# Patient Record
Sex: Female | Born: 1980 | Race: Black or African American | Hispanic: No | Marital: Single | Smoking: Never smoker
Health system: Southern US, Community
[De-identification: ages and names within clinical notes are randomized; demographics above are authoritative.]

## PROBLEM LIST (undated history)

## (undated) DIAGNOSIS — I2699 Other pulmonary embolism without acute cor pulmonale: Secondary | ICD-10-CM

## (undated) DIAGNOSIS — Z9289 Personal history of other medical treatment: Secondary | ICD-10-CM

## (undated) DIAGNOSIS — I82409 Acute embolism and thrombosis of unspecified deep veins of unspecified lower extremity: Secondary | ICD-10-CM

## (undated) DIAGNOSIS — D219 Benign neoplasm of connective and other soft tissue, unspecified: Secondary | ICD-10-CM

## (undated) DIAGNOSIS — D509 Iron deficiency anemia, unspecified: Secondary | ICD-10-CM

## (undated) HISTORY — PX: TUBAL LIGATION: SHX77

## (undated) HISTORY — PX: DILATION AND CURETTAGE OF UTERUS: SHX78

## (undated) HISTORY — DX: Other pulmonary embolism without acute cor pulmonale: I26.99

## (undated) HISTORY — PX: ABDOMINAL HYSTERECTOMY: SHX81

---

## 2018-04-25 ENCOUNTER — Other Ambulatory Visit: Payer: Self-pay

## 2018-04-25 ENCOUNTER — Emergency Department (HOSPITAL_COMMUNITY): Payer: Self-pay

## 2018-04-25 ENCOUNTER — Encounter (HOSPITAL_COMMUNITY): Payer: Self-pay

## 2018-04-25 ENCOUNTER — Emergency Department (HOSPITAL_COMMUNITY)
Admission: EM | Admit: 2018-04-25 | Discharge: 2018-04-25 | Disposition: A | Discharge status: Home / Self Care | Payer: Self-pay | Attending: Emergency Medicine | Admitting: Emergency Medicine

## 2018-04-25 DIAGNOSIS — Z5321 Procedure and treatment not carried out due to patient leaving prior to being seen by health care provider: Secondary | ICD-10-CM | POA: Insufficient documentation

## 2018-04-25 DIAGNOSIS — R079 Chest pain, unspecified: Secondary | ICD-10-CM | POA: Insufficient documentation

## 2018-04-25 LAB — BASIC METABOLIC PANEL
Anion gap: 16 — ABNORMAL HIGH (ref 5–15)
BUN: <5 mg/dL — ABNORMAL LOW (ref 6–20)
CO2: 25 mmol/L (ref 22–32)
Calcium: 9.3 mg/dL (ref 8.9–10.3)
Chloride: 98 mmol/L (ref 98–111)
Creatinine, Ser: 0.89 mg/dL (ref 0.44–1.00)
GFR calc Af Amer: >60 mL/min (ref >60)
GFR calc non Af Amer: >60 mL/min (ref >60)
GLUCOSE: 96 mg/dL (ref 70–99)
Potassium: 3 mmol/L — ABNORMAL LOW (ref 3.5–5.1)
Sodium: 139 mmol/L (ref 135–145)

## 2018-04-25 LAB — CBC
HCT: 33.9 % — ABNORMAL LOW (ref 36.0–46.0)
Hemoglobin: 10.4 g/dL — ABNORMAL LOW (ref 12.0–15.0)
MCH: 27.4 pg (ref 26.0–34.0)
MCHC: 30.7 g/dL (ref 30.0–36.0)
MCV: 89.2 fL (ref 80.0–100.0)
Platelets: 387 10*3/uL (ref 150–400)
RBC: 3.8 MIL/uL — ABNORMAL LOW (ref 3.87–5.11)
RDW: 17.8 % — ABNORMAL HIGH (ref 11.5–15.5)
WBC: 5.4 10*3/uL (ref 4.0–10.5)
nRBC: 0 % (ref 0.0–0.2)

## 2018-04-25 LAB — I-STAT TROPONIN, ED: Troponin i, poc: 0 ng/mL (ref 0.00–0.08)

## 2018-04-25 LAB — I-STAT BETA HCG BLOOD, ED (MC, WL, AP ONLY): I-stat hCG, quantitative: <5 m[IU]/mL (ref <5)

## 2018-04-25 MED ORDER — SODIUM CHLORIDE 0.9% FLUSH: 3.0000 mL | Freq: Once | INTRAVENOUS | Status: DC

## 2018-04-25 NOTE — ED Notes (Signed)
Called pt x4 for vitals, no response. 

## 2018-04-25 NOTE — ED Notes (Signed)
Pt called x2 for vitals, no response.

## 2018-04-25 NOTE — ED Triage Notes (Signed)
Pt here for central non radiating chest pain for the last 6 months.  No new symptoms today.  A&Ox4.

## 2018-05-08 ENCOUNTER — Encounter (HOSPITAL_COMMUNITY): Payer: Self-pay | Admitting: Emergency Medicine

## 2018-05-08 ENCOUNTER — Inpatient Hospital Stay (HOSPITAL_COMMUNITY): Payer: Medicaid Other

## 2018-05-08 ENCOUNTER — Inpatient Hospital Stay (HOSPITAL_COMMUNITY)
Admission: EM | Admit: 2018-05-08 | Discharge: 2018-05-18 | DRG: 982 | Disposition: A | Discharge status: Home / Self Care | Payer: Medicaid Other | Attending: Internal Medicine | Admitting: Internal Medicine

## 2018-05-08 ENCOUNTER — Other Ambulatory Visit: Payer: Self-pay

## 2018-05-08 ENCOUNTER — Emergency Department (HOSPITAL_COMMUNITY): Payer: Medicaid Other

## 2018-05-08 DIAGNOSIS — R5383 Other fatigue: Secondary | ICD-10-CM | POA: Diagnosis present

## 2018-05-08 DIAGNOSIS — L680 Hirsutism: Secondary | ICD-10-CM | POA: Diagnosis present

## 2018-05-08 DIAGNOSIS — I745 Embolism and thrombosis of iliac artery: Secondary | ICD-10-CM | POA: Diagnosis present

## 2018-05-08 DIAGNOSIS — R000 Tachycardia, unspecified: Secondary | ICD-10-CM | POA: Diagnosis present

## 2018-05-08 DIAGNOSIS — D62 Acute posthemorrhagic anemia: Secondary | ICD-10-CM | POA: Diagnosis present

## 2018-05-08 DIAGNOSIS — Z8249 Family history of ischemic heart disease and other diseases of the circulatory system: Secondary | ICD-10-CM

## 2018-05-08 DIAGNOSIS — E876 Hypokalemia: Secondary | ICD-10-CM | POA: Diagnosis present

## 2018-05-08 DIAGNOSIS — I82412 Acute embolism and thrombosis of left femoral vein: Secondary | ICD-10-CM | POA: Diagnosis not present

## 2018-05-08 DIAGNOSIS — I82409 Acute embolism and thrombosis of unspecified deep veins of unspecified lower extremity: Secondary | ICD-10-CM

## 2018-05-08 DIAGNOSIS — N939 Abnormal uterine and vaginal bleeding, unspecified: Secondary | ICD-10-CM | POA: Diagnosis not present

## 2018-05-08 DIAGNOSIS — I071 Rheumatic tricuspid insufficiency: Secondary | ICD-10-CM | POA: Diagnosis present

## 2018-05-08 DIAGNOSIS — D219 Benign neoplasm of connective and other soft tissue, unspecified: Secondary | ICD-10-CM | POA: Diagnosis not present

## 2018-05-08 DIAGNOSIS — I2609 Other pulmonary embolism with acute cor pulmonale: Principal | ICD-10-CM | POA: Diagnosis present

## 2018-05-08 DIAGNOSIS — I2699 Other pulmonary embolism without acute cor pulmonale: Secondary | ICD-10-CM | POA: Diagnosis not present

## 2018-05-08 DIAGNOSIS — M7989 Other specified soft tissue disorders: Secondary | ICD-10-CM | POA: Diagnosis not present

## 2018-05-08 DIAGNOSIS — R0602 Shortness of breath: Secondary | ICD-10-CM | POA: Diagnosis not present

## 2018-05-08 DIAGNOSIS — Z9851 Tubal ligation status: Secondary | ICD-10-CM

## 2018-05-08 DIAGNOSIS — D259 Leiomyoma of uterus, unspecified: Secondary | ICD-10-CM | POA: Diagnosis present

## 2018-05-08 DIAGNOSIS — R079 Chest pain, unspecified: Secondary | ICD-10-CM

## 2018-05-08 DIAGNOSIS — I871 Compression of vein: Secondary | ICD-10-CM | POA: Diagnosis not present

## 2018-05-08 DIAGNOSIS — I824Y2 Acute embolism and thrombosis of unspecified deep veins of left proximal lower extremity: Secondary | ICD-10-CM | POA: Diagnosis not present

## 2018-05-08 DIAGNOSIS — E873 Alkalosis: Secondary | ICD-10-CM | POA: Diagnosis present

## 2018-05-08 DIAGNOSIS — I82422 Acute embolism and thrombosis of left iliac vein: Secondary | ICD-10-CM | POA: Diagnosis present

## 2018-05-08 DIAGNOSIS — R110 Nausea: Secondary | ICD-10-CM | POA: Diagnosis present

## 2018-05-08 DIAGNOSIS — R600 Localized edema: Secondary | ICD-10-CM | POA: Diagnosis present

## 2018-05-08 DIAGNOSIS — E875 Hyperkalemia: Secondary | ICD-10-CM | POA: Diagnosis present

## 2018-05-08 DIAGNOSIS — N920 Excessive and frequent menstruation with regular cycle: Secondary | ICD-10-CM | POA: Diagnosis present

## 2018-05-08 DIAGNOSIS — R1030 Lower abdominal pain, unspecified: Secondary | ICD-10-CM | POA: Diagnosis not present

## 2018-05-08 DIAGNOSIS — R9431 Abnormal electrocardiogram [ECG] [EKG]: Secondary | ICD-10-CM

## 2018-05-08 DIAGNOSIS — R071 Chest pain on breathing: Secondary | ICD-10-CM | POA: Diagnosis present

## 2018-05-08 HISTORY — DX: Benign neoplasm of connective and other soft tissue, unspecified: D21.9

## 2018-05-08 LAB — CBC
HCT: 33.9 % — ABNORMAL LOW (ref 36.0–46.0)
Hemoglobin: 10.8 g/dL — ABNORMAL LOW (ref 12.0–15.0)
MCH: 27.8 pg (ref 26.0–34.0)
MCHC: 31.9 g/dL (ref 30.0–36.0)
MCV: 87.1 fL (ref 80.0–100.0)
NRBC: 0 % (ref 0.0–0.2)
Platelets: 277 10*3/uL (ref 150–400)
RBC: 3.89 MIL/uL (ref 3.87–5.11)
RDW: 17.9 % — ABNORMAL HIGH (ref 11.5–15.5)
WBC: 4.5 10*3/uL (ref 4.0–10.5)

## 2018-05-08 LAB — POCT I-STAT 7, (LYTES, BLD GAS, ICA,H+H)
Acid-base deficit: 1 mmol/L (ref 0.0–2.0)
Bicarbonate: 22.1 mmol/L (ref 20.0–28.0)
Calcium, Ion: 1.06 mmol/L — ABNORMAL LOW (ref 1.15–1.40)
HCT: 28 % — ABNORMAL LOW (ref 36.0–46.0)
Hemoglobin: 9.5 g/dL — ABNORMAL LOW (ref 12.0–15.0)
O2 Saturation: 95 %
Potassium: 3.3 mmol/L — ABNORMAL LOW (ref 3.5–5.1)
Sodium: 138 mmol/L (ref 135–145)
TCO2: 23 mmol/L (ref 22–32)
pCO2 arterial: 29.4 mmHg — ABNORMAL LOW (ref 32.0–48.0)
pH, Arterial: 7.482 — ABNORMAL HIGH (ref 7.350–7.450)
pO2, Arterial: 68 mmHg — ABNORMAL LOW (ref 83.0–108.0)

## 2018-05-08 LAB — I-STAT BETA HCG BLOOD, ED (MC, WL, AP ONLY): I-stat hCG, quantitative: <5 m[IU]/mL (ref <5)

## 2018-05-08 LAB — HIV ANTIBODY (ROUTINE TESTING W REFLEX): HIV SCREEN 4TH GENERATION: NONREACTIVE

## 2018-05-08 LAB — BASIC METABOLIC PANEL
Anion gap: 17 — ABNORMAL HIGH (ref 5–15)
BUN: <5 mg/dL — ABNORMAL LOW (ref 6–20)
CO2: 25 mmol/L (ref 22–32)
Calcium: 9.1 mg/dL (ref 8.9–10.3)
Chloride: 95 mmol/L — ABNORMAL LOW (ref 98–111)
Creatinine, Ser: 0.77 mg/dL (ref 0.44–1.00)
GFR calc Af Amer: >60 mL/min (ref >60)
GFR calc non Af Amer: >60 mL/min (ref >60)
Glucose, Bld: 112 mg/dL — ABNORMAL HIGH (ref 70–99)
POTASSIUM: 3.1 mmol/L — AB (ref 3.5–5.1)
Sodium: 137 mmol/L (ref 135–145)

## 2018-05-08 LAB — D-DIMER, QUANTITATIVE: D-Dimer, Quant: 2.3 ug/mL-FEU — ABNORMAL HIGH (ref 0.00–0.50)

## 2018-05-08 LAB — I-STAT TROPONIN, ED: Troponin i, poc: 0.02 ng/mL (ref 0.00–0.08)

## 2018-05-08 LAB — ECHOCARDIOGRAM COMPLETE
Height: 63 in
Weight: 2348.8 oz

## 2018-05-08 LAB — HEPARIN LEVEL (UNFRACTIONATED)
Heparin Unfractionated: 0.83 IU/mL — ABNORMAL HIGH (ref 0.30–0.70)
Heparin Unfractionated: 0.68 IU/mL (ref 0.30–0.70)

## 2018-05-08 LAB — LACTIC ACID, PLASMA
Lactic Acid, Venous: 2.3 mmol/L (ref 0.5–1.9)
Lactic Acid, Venous: 2.5 mmol/L (ref 0.5–1.9)

## 2018-05-08 LAB — MAGNESIUM: Magnesium: 1.7 mg/dL (ref 1.7–2.4)

## 2018-05-08 MED ORDER — ONDANSETRON HCL 4 MG PO TABS
4.0000 mg | ORAL_TABLET | Freq: Four times a day (QID) | ORAL | Status: DC | PRN
  Administered 2018-05-12 – 2018-05-16 (×2): 4 mg via ORAL
  Filled 2018-05-08 (×2): qty 1

## 2018-05-08 MED ORDER — POTASSIUM CHLORIDE 10 MEQ/100ML IV SOLN
10.0000 meq | INTRAVENOUS | Status: AC
  Administered 2018-05-08 (×2): 10 meq via INTRAVENOUS
  Filled 2018-05-08 (×2): qty 100

## 2018-05-08 MED ORDER — POTASSIUM CHLORIDE CRYS ER 20 MEQ PO TBCR
40.0000 meq | EXTENDED_RELEASE_TABLET | Freq: Once | ORAL | Status: AC
  Administered 2018-05-08: 40 meq via ORAL
  Filled 2018-05-08: qty 2

## 2018-05-08 MED ORDER — HEPARIN SODIUM (PORCINE) 5000 UNIT/ML IJ SOLN: 60.0000 [IU]/kg | Freq: Once | INTRAMUSCULAR | Status: DC

## 2018-05-08 MED ORDER — HYDRALAZINE HCL 20 MG/ML IJ SOLN
10.0000 mg | Freq: Four times a day (QID) | INTRAMUSCULAR | Status: DC | PRN
  Administered 2018-05-08: 10 mg via INTRAVENOUS
  Filled 2018-05-08: qty 1

## 2018-05-08 MED ORDER — ACETAMINOPHEN 650 MG RE SUPP: 650.0000 mg | Freq: Four times a day (QID) | RECTAL | Status: DC | PRN

## 2018-05-08 MED ORDER — HYDROCODONE-ACETAMINOPHEN 5-325 MG PO TABS
1.0000 | ORAL_TABLET | Freq: Four times a day (QID) | ORAL | Status: DC | PRN
  Administered 2018-05-08: 2 via ORAL
  Filled 2018-05-08: qty 2

## 2018-05-08 MED ORDER — MAGNESIUM SULFATE IN D5W 1-5 GM/100ML-% IV SOLN
1.0000 g | Freq: Once | INTRAVENOUS | Status: AC
  Administered 2018-05-08: 1 g via INTRAVENOUS
  Filled 2018-05-08: qty 100

## 2018-05-08 MED ORDER — ONDANSETRON HCL 4 MG/2ML IJ SOLN
4.0000 mg | Freq: Four times a day (QID) | INTRAMUSCULAR | Status: DC | PRN
  Administered 2018-05-08 – 2018-05-17 (×9): 4 mg via INTRAVENOUS
  Filled 2018-05-08 (×10): qty 2

## 2018-05-08 MED ORDER — IOPAMIDOL (ISOVUE-370) INJECTION 76%
75.0000 mL | Freq: Once | INTRAVENOUS | Status: AC | PRN
  Administered 2018-05-08: 75 mL via INTRAVENOUS

## 2018-05-08 MED ORDER — ENSURE ENLIVE PO LIQD: 237.0000 mL | Freq: Two times a day (BID) | ORAL | Status: DC

## 2018-05-08 MED ORDER — ACETAMINOPHEN 325 MG PO TABS
650.0000 mg | ORAL_TABLET | Freq: Four times a day (QID) | ORAL | Status: DC | PRN
  Administered 2018-05-08: 650 mg via ORAL
  Filled 2018-05-08: qty 2

## 2018-05-08 MED ORDER — HEPARIN (PORCINE) 25000 UT/250ML-% IV SOLN
1200.0000 [IU]/h | INTRAVENOUS | Status: DC
  Administered 2018-05-08: 1000 [IU]/h via INTRAVENOUS
  Administered 2018-05-08: 1100 [IU]/h via INTRAVENOUS
  Administered 2018-05-09 – 2018-05-10 (×3): 950 [IU]/h via INTRAVENOUS
  Administered 2018-05-12: 1050 [IU]/h via INTRAVENOUS
  Administered 2018-05-13 – 2018-05-14 (×3): 1200 [IU]/h via INTRAVENOUS
  Filled 2018-05-08 (×8): qty 250

## 2018-05-08 MED ORDER — HEPARIN BOLUS VIA INFUSION
5000.0000 [IU] | Freq: Once | INTRAVENOUS | Status: AC
  Administered 2018-05-08: 5000 [IU] via INTRAVENOUS
  Filled 2018-05-08: qty 5000

## 2018-05-08 MED ORDER — METOPROLOL TARTRATE 5 MG/5ML IV SOLN
2.5000 mg | Freq: Four times a day (QID) | INTRAVENOUS | Status: DC
  Administered 2018-05-08 – 2018-05-09 (×5): 2.5 mg via INTRAVENOUS
  Filled 2018-05-08 (×4): qty 5

## 2018-05-08 MED ORDER — IOPAMIDOL (ISOVUE-370) INJECTION 76%
INTRAVENOUS | Status: AC
  Filled 2018-05-08: qty 100

## 2018-05-08 MED ORDER — SODIUM CHLORIDE 0.9% FLUSH: 3.0000 mL | Freq: Once | INTRAVENOUS | Status: DC

## 2018-05-08 NOTE — Progress Notes (Signed)
ANTICOAGULATION CONSULT NOTE - Follow Up Consult  Pharmacy Consult for Heparin Indication: pulmonary embolus  No Known Allergies  Patient Measurements: Height: 5\' 3"  (160 cm) Weight: 146 lb 12.8 oz (66.6 kg) IBW/kg (Calculated) : 52.4 Heparin Dosing Weight: 66.6 kg  Vital Signs: Temp: 98 F (36.7 C) (02/10 1938) Temp Source: Oral (02/10 1938) BP: 162/121 (02/10 2016) Pulse Rate: 84 (02/10 1938)  Labs: Recent Labs    05/08/18 0110 05/08/18 0543 05/08/18 1112 05/08/18 1933  HGB 10.8* 9.5*  --   --   HCT 33.9* 28.0*  --   --   PLT 277  --   --   --   HEPARINUNFRC  --   --  0.83* 0.68  CREATININE 0.77  --   --   --     Estimated Creatinine Clearance: 87.5 mL/min (by C-G formula based on SCr of 0.77 mg/dL).  Assessment: 8 YOF presented with swelling to LLE since 1400 Sunday followed by SOB and chest tightness, CT reveals bilateral PE with evidence of right heart strain.  Pharmacy consulted to dose IV heparin.  Heparin level decreased to high therapeutic range tonight; no bleeding reported.  No plan to EKOS tonight.  Goal of Therapy:  Heparin level 0.3-0.7 units/ml Monitor platelets by anticoagulation protocol: Yes   Plan:  Reduce heparin gtt slightly to 950 units/hr F/U AM labs  Tkai Serfass D. Mina Marble, PharmD, BCPS, [place] 05/08/2018, 8:30 PM

## 2018-05-08 NOTE — ED Notes (Signed)
Admitting paged re: pts BP

## 2018-05-08 NOTE — Care Management Note (Addendum)
Case Management Note  Patient Details  Name: Holly Graham MRN: [id number] Date of Birth: 03-May-1980  Subjective/Objective:    From home , presents with PE and LLE DVT. Has no insurance, no PCP, NCM scheduled follow up apt with [place].  Will need patient assist with DOAC( which ever one MD puts her on) , while awaiting patient ast, she can get refill at the [facility] clinic for 10.00.   2/12 Tomi Bamberger RN, BSN - NCM contacted MD to send scripts to [facility] on Friday so that they can fill scripts for patient before she is dc.  NCM awaiting to see what DOAC they will start her on, so she can get the coupon.  NCM gave patient a Match Letter to asst with getting her med[facility]   2/14 Tomi Bamberger RN, BSN - plan for catheter directed lytic therapy on Monday.     2/17 Tomi Bamberger RN, BSN- patient transferred , Match letter dates may need to be changed at dc.              Action/Plan: NCM will follow for transition of care needs.   Expected Discharge Date:                  Expected Discharge Plan:  Home/Self Care  In-House Referral:     Discharge planning Services  CM Consult, Follow-up appt scheduled, Medication Assistance, [facility]  Post Acute Care Choice:    Choice offered to:     DME Arranged:    DME Agency:     HH Arranged:    HH Agency:     Status of Service:  Completed, signed off  If discussed at [employer] of Stay Meetings, dates discussed:    Additional Comments:  Zenon Mayo, RN 05/08/2018, 2:11 PM

## 2018-05-08 NOTE — H&P (Signed)
Chief Complaint: Pulmonary embolism  Referring Physician(s): Thereasa Solo  Supervising Physician: Daryll Brod  Patient Status: [facility] - In-pt  History of Present Illness: Holly Graham is a 38 y.o. female with really no medical issues at all who presented to the ED yesterday with swelling of her left leg.  She also reports about 3-4 weeks of dyspnea on exertion.  She does not take oral contraceptives. Her bother has history of PE.  CTA = Positive for acute PE with CTevidence of right heart strain (RV/LV Ratio = 1.5) consistent with at least submassive (intermediate risk) PE.  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  We are asked to evaluate her for EKOS catheter directed PE lysis.  She denies any history of GI bleeding, intracranial bleeding, broke bones, recent surgery, or any other risk factor that would be contraindicated for thrombolysis.  History reviewed. No pertinent past medical history.  History reviewed. No pertinent surgical history.  Allergies: Patient has no known allergies.  Medications: Prior to Admission medications   Not on File     Family History  Problem Relation Age of Onset  . Pulmonary embolism Brother     Social History   Socioeconomic History  . Marital status: Single    Spouse name: Not on file  . Number of children: Not on file  . Years of education: Not on file  . Highest education level: Not on file  Occupational History  . Not on file  Social Needs  . Financial resource strain: Not on file  . Food insecurity:    Worry: Not on file    Inability: Not on file  . Transportation needs:    Medical: Not on file    Non-medical: Not on file  Tobacco Use  . Smoking status: Never Smoker  . Smokeless tobacco: Never Used  Substance and Sexual Activity  . Alcohol use: Yes  . Drug use: Never  . Sexual activity: Yes  Lifestyle  . Physical activity:    Days per week: Not on file     Minutes per session: Not on file  . Stress: Not on file  Relationships  . Social connections:    Talks on phone: Not on file    Gets together: Not on file    Attends religious service: Not on file    Active member of club or organization: Not on file    Attends meetings of clubs or organizations: Not on file    Relationship status: Not on file  Other Topics Concern  . Not on file  Social History Narrative  . Not on file     Review of Systems: A 12 point ROS discussed and pertinent positives are indicated in the HPI above.  All other systems are negative.  Review of Systems  Vital Signs: BP (!) 145/116   Pulse 85   Temp 98.1 F (36.7 C) (Oral)   Resp (!) 28   Ht 5\' 3"  (1.6 m)   Wt 66.6 kg   LMP 05/04/2018   SpO2 98%   BMI 26.00 kg/m   Physical Exam Vitals signs reviewed.  Constitutional:      Appearance: Normal appearance.  HENT:     Head: Atraumatic.  Eyes:     Extraocular Movements: Extraocular movements intact.  Neck:     Musculoskeletal: Normal range of motion.  Cardiovascular:     Rate and Rhythm: Normal rate and regular rhythm.  Pulmonary:  Effort: Pulmonary effort is normal. No respiratory distress.     Breath sounds: Normal breath sounds.  Abdominal:     General: There is no distension.     Palpations: Abdomen is soft.  Musculoskeletal: Normal range of motion.  Skin:    General: Skin is warm and dry.  Neurological:     General: No focal deficit present.     Mental Status: She is alert and oriented to person, place, and time.  Psychiatric:        Mood and Affect: Mood normal.        Behavior: Behavior normal.        Thought Content: Thought content normal.        Judgment: Judgment normal.     Imaging: Dg Chest 2 View  Result Date: 05/08/2018 CLINICAL DATA:  Chest pain, shortness of Breath EXAM: CHEST - 2 VIEW COMPARISON:  04/25/2018 FINDINGS: Heart and mediastinal contours are within normal limits. No focal opacities or effusions. No  acute bony abnormality. IMPRESSION: No active cardiopulmonary disease. Electronically Signed   By: Rolm Baptise M.D.   On: 05/08/2018 01:24   Dg Chest 2 View  Result Date: 04/25/2018 CLINICAL DATA:  Nausea and fatigue for 8 months. EXAM: CHEST - 2 VIEW COMPARISON:  None. FINDINGS: The heart size and mediastinal contours are within normal limits. Both lungs are clear. The visualized skeletal structures are unremarkable. IMPRESSION: No active cardiopulmonary disease. Electronically Signed   By: Ashley Royalty M.D.   On: 04/25/2018 01:02   Ct Angio Chest Pe W And/or Wo Contrast  Result Date: 05/08/2018 CLINICAL DATA:  38 y/o F; swelling of left leg with no known injury. Shortness of breath and chest tightness. EXAM: CT ANGIOGRAPHY CHEST WITH CONTRAST TECHNIQUE: Multidetector CT imaging of the chest was performed using the standard protocol during bolus administration of intravenous contrast. Multiplanar CT image reconstructions and MIPs were obtained to evaluate the vascular anatomy. CONTRAST:  55 cc Isovue 370 COMPARISON:  05/08/2018 chest radiograph. FINDINGS: Cardiovascular: Acute right lobar and bilateral segmental pulmonary emboli. RV/LV = 1.5. Normal caliber thoracic aorta. Mediastinum/Nodes: No enlarged mediastinal, hilar, or axillary lymph nodes. Thyroid gland, trachea, and esophagus demonstrate no significant findings. Lungs/Pleura: Lungs are clear. No pleural effusion or pneumothorax. Upper Abdomen: No acute abnormality. Musculoskeletal: No chest wall abnormality. No acute or significant osseous findings. Review of the MIP images confirms the above findings. IMPRESSION: Positive for acute PE with CTevidence of right heart strain (RV/LV Ratio = 1.5) consistent with at least submassive (intermediate risk) PE. The presence of right heart strain has been associated with an increased risk of morbidity and mortality. Critical Value/emergent results were called by telephone at the time of interpretation on  05/08/2018 at 4:29 am to Dr. Pryor Curia , who verbally acknowledged these results. Electronically Signed   By: Kristine Garbe M.D.   On: 05/08/2018 04:30   Vas [place] Lower Extremity Venous (dvt)  Result Date: 05/08/2018  Lower Venous Study Risk Factors: Confirmed PE. Comparison Study: No prior study on file for comparison Performing Technologist: Sharion Dove RVS  Examination Guidelines: A complete evaluation includes B-mode imaging, spectral Doppler, color Doppler, and power Doppler as needed of all accessible portions of each vessel. Bilateral testing is considered an integral part of a complete examination. Limited examinations for reoccurring indications may be performed as noted.  Right Venous Findings: +---------+---------------+---------+-----------+----------+-------+          CompressibilityPhasicitySpontaneityPropertiesSummary +---------+---------------+---------+-----------+----------+-------+ CFV      Full  Yes      Yes                          +---------+---------------+---------+-----------+----------+-------+ SFJ      Full                                                 +---------+---------------+---------+-----------+----------+-------+ FV Prox  Full                                                 +---------+---------------+---------+-----------+----------+-------+ FV Mid   Full                                                 +---------+---------------+---------+-----------+----------+-------+ FV DistalFull                                                 +---------+---------------+---------+-----------+----------+-------+ PFV      Full                                                 +---------+---------------+---------+-----------+----------+-------+ POP      Full           Yes      Yes                          +---------+---------------+---------+-----------+----------+-------+ PTV      Full                                                  +---------+---------------+---------+-----------+----------+-------+ PERO     Full                                                 +---------+---------------+---------+-----------+----------+-------+  Left Venous Findings: +---------+---------------+---------+-----------+----------+-------+          CompressibilityPhasicitySpontaneityPropertiesSummary +---------+---------------+---------+-----------+----------+-------+ CFV      Full           No       No                           +---------+---------------+---------+-----------+----------+-------+ SFJ      Full           No       No                           +---------+---------------+---------+-----------+----------+-------+ FV Prox  Full                                                 +---------+---------------+---------+-----------+----------+-------+  FV Mid   Full                                                 +---------+---------------+---------+-----------+----------+-------+ FV DistalFull                                                 +---------+---------------+---------+-----------+----------+-------+ PFV      Full           No       No                           +---------+---------------+---------+-----------+----------+-------+ POP      Full           No       No                           +---------+---------------+---------+-----------+----------+-------+ PTV      Full                                                 +---------+---------------+---------+-----------+----------+-------+ PERO     Full                                                 +---------+---------------+---------+-----------+----------+-------+ iliac    None           No       No                   Acute   +---------+---------------+---------+-----------+----------+-------+ Continuous waveforms throughout with no evidence of lower extremity DVT    Summary: Right: There is no  evidence of deep vein thrombosis in the lower extremity. Left: There is no evidence of deep vein thrombosis in the lower extremity. Continuous waveforms throughout with no evidence of lower extremity DVT. Thrombus noted in the left iliac but does not extend into the IVC  *See table(s) above for measurements and observations.    Preliminary     Labs:  CBC: Recent Labs    04/25/18 0041 05/08/18 0110 05/08/18 0543  WBC 5.4 4.5  --   HGB 10.4* 10.8* 9.5*  HCT 33.9* 33.9* 28.0*  PLT 387 277  --     COAGS: No results for input(s): INR, APTT in the last 8760 hours.  BMP: Recent Labs    04/25/18 0041 05/08/18 0110 05/08/18 0543  NA 139 137 138  K 3.0* 3.1* 3.3*  CL 98 95*  --   CO2 25 25  --   GLUCOSE 96 112*  --   BUN <5* <5*  --   CALCIUM 9.3 9.1  --   CREATININE 0.89 0.77  --   GFRNONAA >60 >60  --   GFRAA >60 >60  --     LIVER FUNCTION TESTS: No results for input(s): BILITOT, AST, ALT, ALKPHOS, PROT, ALBUMIN in the last 8760 hours.  TUMOR  MARKERS: No results for input(s): AFPTM, CEA, CA199, CHROMGRNA in the last 8760 hours.  Assessment and Plan:  Acute pulmonary embolus with right heart strain on CT.  ECHO pending. She does feels better on heparin drip. Not as tachypneic. HR 85.  Risks and benefits discussed with the patient including, but not limited to bleeding, possible life threatening bleeding and need for blood product transfusion, vascular injury, stroke, contrast induced renal failure, limb loss and infection.  All of the patient's questions were answered, patient is agreeable to proceed.  Discussed with Dr. Annamaria Boots. CT images reviewed by him.  Hold off today and wait for ECHO results tomorrow.  NPO after MN. Will re-evaluate patient tomorrow.  Thank you for this interesting consult.  I greatly enjoyed meeting Holly Graham and look forward to participating in their care.  A copy of this report was sent to the requesting provider on this  date.  Electronically Signed: Murrell Redden, PA-C   05/08/2018, 3:37 PM      I spent a total of 40 Minutes in face to face in clinical consultation, greater than 50% of which was counseling/coordinating care for PE lysis.

## 2018-05-08 NOTE — ED Notes (Signed)
Dr. Alcario Drought is aware of lactic of 2.3

## 2018-05-08 NOTE — Progress Notes (Signed)
VASCULAR LAB PRELIMINARY  PRELIMINARY  PRELIMINARY  PRELIMINARY  Bilateral lower extremity venous duplex and IVC/Iliac scans completed.    Preliminary report:  See CV Proc  Called results to Dr. Dolly Rias, [facility], RVT 05/08/2018, 2:45 PM

## 2018-05-08 NOTE — ED Notes (Signed)
McCLung, MD returned call re: concern with HTN, orders to be placed

## 2018-05-08 NOTE — ED Notes (Signed)
Attempted report 

## 2018-05-08 NOTE — H&P (Signed)
History and Physical    Holly Graham SWF:[id number] DOB: 12-Feb-1981 DOA: 05/08/2018  PCP: Patient, No Pcp Per  Patient coming from: Home  I have personally briefly reviewed patient's old medical records in [place]  Chief Complaint: SOB, leg swelling  HPI: Holly Graham is a 38 y.o. female with medical history significant of previously healthy.  Patient presents to the ED with worsening SOB, onset some 3-4 weeks ago.  No CP, no pain with breathing, no cough, no fever, no N/V.  No h/o asthma, non-smoker.  Today she noticed left upper leg began to swell, this extended down leg.  No leg pain.  This prompted her to come to ED.  Not on OCP.  Does have family h/o PE in her brother.  ED Course: D.Dimer is positive.  CTA confirms PE with R heart strain.  HR 130, RR 30.  BP 150/100.  AG 17.  Trop neg.   Review of Systems: As per HPI otherwise 10 point review of systems negative.   History reviewed. No pertinent past medical history.  History reviewed. No pertinent surgical history.   reports that she has never smoked. She has never used smokeless tobacco. She reports current alcohol use. She reports that she does not use drugs.  No Known Allergies  Family History  Problem Relation Age of Onset  . Pulmonary embolism Brother      Prior to Admission medications   Not on File    Physical Exam: Vitals:   05/08/18 0059 05/08/18 0315 05/08/18 0445  BP: (!) 143/94 (!) 137/107 (!) 152/104  Pulse: (!) 136 (!) 117 (!) 129  Resp: (!) 25 (!) 35 (!) 26  Temp: 98.1 F (36.7 C)    TempSrc: Oral    SpO2: 93% 100% 98%  Weight: 63.5 kg    Height: 5\' 3"  (1.6 m)      Constitutional: NAD, calm, comfortable Eyes: PERRL, lids and conjunctivae normal ENMT: Mucous membranes are moist. Posterior pharynx clear of any exudate or lesions.Normal dentition.  Neck: normal, supple, no masses, no thyromegaly Respiratory: clear to auscultation bilaterally, no wheezing, no crackles. Normal  respiratory effort. No accessory muscle use.  Cardiovascular: Tachycardic, Regular rhythm Abdomen: no tenderness, no masses palpated. No hepatosplenomegaly. Bowel sounds positive.  Musculoskeletal: LLE with mild swelling, no discoloration, warm, has pulse. Skin: no rashes, lesions, ulcers. No induration Neurologic: CN 2-12 grossly intact. Sensation intact, DTR normal. Strength 5/5 in all 4.  Psychiatric: Normal judgment and insight. Alert and oriented x 3. Normal mood.    Labs on Admission: I have personally reviewed following labs and imaging studies  CBC: Recent Labs  Lab 05/08/18 0110  WBC 4.5  HGB 10.8*  HCT 33.9*  MCV 87.1  PLT 220   Basic Metabolic Panel: Recent Labs  Lab 05/08/18 0110  NA 137  K 3.1*  CL 95*  CO2 25  GLUCOSE 112*  BUN <5*  CREATININE 0.77  CALCIUM 9.1  MG 1.7   GFR: Estimated Creatinine Clearance: 85.5 mL/min (by C-G formula based on SCr of 0.77 mg/dL). Liver Function Tests: No results for input(s): AST, ALT, ALKPHOS, BILITOT, PROT, ALBUMIN in the last 168 hours. No results for input(s): LIPASE, AMYLASE in the last 168 hours. No results for input(s): AMMONIA in the last 168 hours. Coagulation Profile: No results for input(s): INR, PROTIME in the last 168 hours. Cardiac Enzymes: No results for input(s): CKTOTAL, CKMB, CKMBINDEX, TROPONINI in the last 168 hours. BNP (last 3 results) No results for input(s):  PROBNP in the last 8760 hours. HbA1C: No results for input(s): HGBA1C in the last 72 hours. CBG: No results for input(s): GLUCAP in the last 168 hours. Lipid Profile: No results for input(s): CHOL, HDL, LDLCALC, TRIG, CHOLHDL, LDLDIRECT in the last 72 hours. Thyroid Function Tests: No results for input(s): TSH, T4TOTAL, FREET4, T3FREE, THYROIDAB in the last 72 hours. Anemia Panel: No results for input(s): VITAMINB12, FOLATE, FERRITIN, TIBC, IRON, RETICCTPCT in the last 72 hours. Urine analysis: No results found for: COLORURINE,  APPEARANCEUR, LABSPEC, PHURINE, GLUCOSEU, HGBUR, BILIRUBINUR, KETONESUR, PROTEINUR, UROBILINOGEN, NITRITE, LEUKOCYTESUR  Radiological Exams on Admission: Dg Chest 2 View  Result Date: 05/08/2018 CLINICAL DATA:  Chest pain, shortness of Breath EXAM: CHEST - 2 VIEW COMPARISON:  04/25/2018 FINDINGS: Heart and mediastinal contours are within normal limits. No focal opacities or effusions. No acute bony abnormality. IMPRESSION: No active cardiopulmonary disease. Electronically Signed   By: Rolm Baptise M.D.   On: 05/08/2018 01:24   Ct Angio Chest Pe W And/or Wo Contrast  Result Date: 05/08/2018 CLINICAL DATA:  38 y/o F; swelling of left leg with no known injury. Shortness of breath and chest tightness. EXAM: CT ANGIOGRAPHY CHEST WITH CONTRAST TECHNIQUE: Multidetector CT imaging of the chest was performed using the standard protocol during bolus administration of intravenous contrast. Multiplanar CT image reconstructions and MIPs were obtained to evaluate the vascular anatomy. CONTRAST:  55 cc Isovue 370 COMPARISON:  05/08/2018 chest radiograph. FINDINGS: Cardiovascular: Acute right lobar and bilateral segmental pulmonary emboli. RV/LV = 1.5. Normal caliber thoracic aorta. Mediastinum/Nodes: No enlarged mediastinal, hilar, or axillary lymph nodes. Thyroid gland, trachea, and esophagus demonstrate no significant findings. Lungs/Pleura: Lungs are clear. No pleural effusion or pneumothorax. Upper Abdomen: No acute abnormality. Musculoskeletal: No chest wall abnormality. No acute or significant osseous findings. Review of the MIP images confirms the above findings. IMPRESSION: Positive for acute PE with CTevidence of right heart strain (RV/LV Ratio = 1.5) consistent with at least submassive (intermediate risk) PE. The presence of right heart strain has been associated with an increased risk of morbidity and mortality. Critical Value/emergent results were called by telephone at the time of interpretation on 05/08/2018  at 4:29 am to Dr. Pryor Curia , who verbally acknowledged these results. Electronically Signed   By: Kristine Garbe M.D.   On: 05/08/2018 04:30    EKG: Independently reviewed.  Assessment/Plan Principal Problem:   Acute pulmonary embolism with acute cor pulmonale (HCC) Active Problems:   Hypokalemia    1. Acute PE with cor pulmonale - 1. Also going to have LLE DVT with a fair amount of clot burden given leg swelling no doubt 2. Heparin gtt initiated 3. 2d echo ordered 4. BLE venous US ordered to evaluate DVT 5. ABG ordered 6. Lactic acid ordered 7. O2 sat 99% on RA 8. Spoke with Dr. Oletta Darter, PCCM will see patient and evaluate for catheter directed TPA 2. Hypokalemia - 1. Replacing K 2. Repeat BMP tomorrow AM  DVT prophylaxis: Heparin gtt Code Status: Full Family Communication: No family in room Disposition Plan: Home after admit Consults called: Dr. Oletta Darter, PCCM Admission status: Admit to inpatient  Severity of Illness: The appropriate patient status for this patient is INPATIENT. Inpatient status is judged to be reasonable and necessary in order to provide the required intensity of service to ensure the patient's safety. The patient's presenting symptoms, physical exam findings, and initial radiographic and laboratory data in the context of their chronic comorbidities is felt to place them at high risk  for further clinical deterioration. Furthermore, it is not anticipated that the patient will be medically stable for discharge from the hospital within 2 midnights of admission. The following factors support the patient status of inpatient.   " The patient's presenting symptoms include SOB, leg swelling. " The worrisome physical exam findings include Tachypnea 30-35, tachycardia 130, leg swelling. " The initial radiographic and laboratory data are worrisome because of PE with fairly large clot burden and R heart strain on CT.   * I certify that at the point of  admission it is my clinical judgment that the patient will require inpatient hospital care spanning beyond 2 midnights from the point of admission due to high intensity of service, high risk for further deterioration and high frequency of surveillance required.*    GARDNER, JARED M. DO [facility]  How to contact the [facility] Attending or Consulting provider [place] or covering provider during after hours [place], for this patient?  1. Check the care team in [facility] and look for a) attending/consulting [facility] provider listed and b) the [facility] team listed 2. Log into [email or web address]  [employer] Physician Scheduling and messaging for groups and whole hospitals  On call and physician scheduling software for group practices, residents, hospitalists and other medical providers for call, clinic, rotation and shift schedules. On[employer] is a hospital-wide system for scheduling doctors and paging doctors on call. EasyPlot is for scientific plotting and data analysis.  [email or web address]  and use [place]'s universal password to access. If you do not have the password, please contact the hospital operator.  3. Locate the [facility] provider you are looking for under [facility] and page to a number that you can be directly reached. 4. If you still have difficulty reaching the provider, please page the [facility] (Director on Call) for the Hospitalists listed on amion for assistance.  05/08/2018, 5:13 AM

## 2018-05-08 NOTE — Progress Notes (Signed)
*  PRELIMINARY RESULTS* Echocardiogram 2D Echocardiogram has been performed.  05/08/2018, 2:05 PM

## 2018-05-08 NOTE — ED Notes (Signed)
PAGED ADMITTING PER RN [facility]

## 2018-05-08 NOTE — ED Notes (Signed)
Received a phone call from lab critical lab lactic acid 2.5 reported to admit doctor.

## 2018-05-08 NOTE — Progress Notes (Signed)
ANTICOAGULATION CONSULT NOTE - Follow Up Consult  Pharmacy Consult for Heparin Indication: pulmonary embolus  No Known Allergies  Patient Measurements: Height: 5\' 3"  (160 cm) Weight: 146 lb 12.8 oz (66.6 kg) IBW/kg (Calculated) : 52.4 Heparin Dosing Weight: 66.6 kg  Vital Signs: Temp: 98.1 F (36.7 C) (02/10 1109) Temp Source: Oral (02/10 1109) BP: 145/116 (02/10 1109) Pulse Rate: 85 (02/10 1109)  Labs: Recent Labs    05/08/18 0110 05/08/18 0543 05/08/18 1112  HGB 10.8* 9.5*  --   HCT 33.9* 28.0*  --   PLT 277  --   --   HEPARINUNFRC  --   --  0.83*  CREATININE 0.77  --   --     Estimated Creatinine Clearance: 87.5 mL/min (by C-G formula based on SCr of 0.77 mg/dL).  Assessment:  38yo female c/o swelling to LLE since 1400 Sun followed by SOB and chest tightness, CT reveals bilateral PE w/ evidence of right heart strain.  IV heparin begun in ED ~5am.    Initial heparin level is supratherapeutic (0.83) on 1100 units/hr.   Critical care recommended IR consult for possible EKOS.  Goal of Therapy:  Heparin level 0.3-0.7 units/ml Monitor platelets by anticoagulation protocol: Yes   Plan:   Decrease heparin to 1000 units/hr  Heparin level ~6 hrs after rate change.  Daily heparin level and CBC.  Follow up plans.  Arty Baumgartner, [place] Pager: [phone] or phone: [phone] 05/08/2018,1:01 PM

## 2018-05-08 NOTE — Progress Notes (Signed)
ANTICOAGULATION CONSULT NOTE - Initial Consult  Pharmacy Consult for heparin Indication: pulmonary embolus  No Known Allergies  Patient Measurements: Height: 5\' 3"  (160 cm) Weight: 140 lb (63.5 kg) IBW/kg (Calculated) : 52.4  Vital Signs: Temp: 98.1 F (36.7 C) (02/10 0059) Temp Source: Oral (02/10 0059) BP: 137/107 (02/10 0315) Pulse Rate: 117 (02/10 0315)  Labs: Recent Labs    05/08/18 0110  HGB 10.8*  HCT 33.9*  PLT 277  CREATININE 0.77    Estimated Creatinine Clearance: 85.5 mL/min (by C-G formula based on SCr of 0.77 mg/dL).   Medical History: History reviewed. No pertinent past medical history.   Assessment: 38yo female c/o swelling to LLE since 1400 Sun followed by SOB and chest tightness, CT reveals PE w/ evidence of RHS, to begin heparin.  Goal of Therapy:  Heparin level 0.3-0.7 units/ml Monitor platelets by anticoagulation protocol: Yes   Plan:  Will give heparin 5000 units bolus x1 followed by gtt at 1100 units/hr and monitor heparin levels and CBC.  Wynona Neat, PharmD, BCPS  05/08/2018,4:50 AM

## 2018-05-08 NOTE — ED Provider Notes (Signed)
Medical screening examination/treatment/procedure(s) were conducted as a shared visit with non-physician practitioner(s) and myself.  I personally evaluated the patient during the encounter.  EKG Interpretation  Date/Time:  Monday May 08 2018 01:07:02 EST Ventricular Rate:  128 PR Interval:  142 QRS Duration: 86 QT Interval:  304 QTC Calculation: 443 R Axis:   10 Text Interpretation:  Sinus tachycardia Right atrial enlargement Nonspecific ST and T wave abnormality Abnormal ECG Baseline wander improved  NO STEMI Confirmed by Addison Lank [phone]) on 05/08/2018 1:11:9 AM    38 year old female who presents with chest pain, shortness of breath, left lower extremity swelling and pain.  Patient tachycardic, tachypneic without hypoxia.  CTA shows bilateral pulmonary emboli and signs of right heart strain.  EKG also shows prolonged QT interval.  Potassium 3.1, magnesium 1.7.  She is not on any medications.  Denies previous history of the same.  Will optimize electrolytes.  Will start heparin and admit.   Holly Graham, Delice Bison, DO 05/08/18 [phone]

## 2018-05-08 NOTE — ED Triage Notes (Signed)
C/o swelling to L leg (upper and lower) since 2pm with no known injury.  Reports SOB, chest tightness, and increased tightness to L leg x 35-40 min.  States she feels like she is going to pass out.

## 2018-05-08 NOTE — ED Provider Notes (Signed)
[facility] EMERGENCY DEPARTMENT Provider Note   CSN: [id number] Arrival date & time: 05/08/18  0044     History   Chief Complaint Chief Complaint  Patient presents with  . Leg Swelling  . Shortness of Breath  . Chest Pain  . Leg Pain    HPI Holly Graham is a 38 y.o. female.  Patient without significant medical history presents with SOB x 3-4 weeks. No chest pain or pain with breathing. No history of cough or fever, nausea, vomiting. She denies any history of asthma and is a nonsmoker. Today, she noticed her left upper leg began to swell, extending to lower leg. No injury or redness. No significant pain in the LE.   The history is provided by the patient. No language interpreter was used.  Shortness of Breath  Associated symptoms: no chest pain and no fever   Chest Pain  Associated symptoms: shortness of breath   Associated symptoms: no fever, no numbness and no weakness   Leg Pain  Associated symptoms: no fever     History reviewed. No pertinent past medical history.  There are no active problems to display for this patient.   History reviewed. No pertinent surgical history.   OB History   No obstetric history on file.      Home Medications    Prior to Admission medications   Not on File    Family History No family history on file.  Social History Social History   Tobacco Use  . Smoking status: Never Smoker  . Smokeless tobacco: Never Used  Substance Use Topics  . Alcohol use: Yes  . Drug use: Never     Allergies   Patient has no known allergies.   Review of Systems Review of Systems  Constitutional: Negative for chills and fever.  HENT: Negative.   Respiratory: Positive for shortness of breath.   Cardiovascular: Negative for chest pain.  Gastrointestinal: Negative.   Musculoskeletal:       See HPI.  Skin: Negative.  Negative for color change and wound.  Neurological: Negative.  Negative for weakness and numbness.      Physical Exam Updated Vital Signs BP (!) 137/107   Pulse (!) 117   Temp 98.1 F (36.7 C) (Oral)   Resp (!) 35   Ht 5\' 3"  (1.6 m)   Wt 63.5 kg   LMP 05/04/2018   SpO2 100%   BMI 24.80 kg/m   Physical Exam Vitals signs and nursing note reviewed.  Constitutional:      Appearance: She is well-developed.  HENT:     Head: Normocephalic.  Neck:     Musculoskeletal: Normal range of motion and neck supple.  Cardiovascular:     Rate and Rhythm: Regular rhythm. Tachycardia present.  Pulmonary:     Effort: Pulmonary effort is normal. Tachypnea present.     Breath sounds: Examination of the right-lower field reveals decreased breath sounds. Decreased breath sounds present. No wheezing, rhonchi or rales.  Chest:     Chest wall: No tenderness.  Abdominal:     General: Bowel sounds are normal.     Palpations: Abdomen is soft.     Tenderness: There is no abdominal tenderness. There is no guarding or rebound.  Musculoskeletal: Normal range of motion.     Comments: Left leg is swollen without pitting. No redness. No focal tenderness. Pulses present.   Skin:    General: Skin is warm and dry.     Findings: No rash.  Neurological:     General: No focal deficit present.     Mental Status: She is alert and oriented to person, place, and time.      ED Treatments / Results  Labs (all labs ordered are listed, but only abnormal results are displayed) Labs Reviewed  BASIC METABOLIC PANEL - Abnormal; Notable for the following components:      Result Value   Potassium 3.1 (*)    Chloride 95 (*)    Glucose, Bld 112 (*)    BUN <5 (*)    Anion gap 17 (*)    All other components within normal limits  CBC - Abnormal; Notable for the following components:   Hemoglobin 10.8 (*)    HCT 33.9 (*)    RDW 17.9 (*)    All other components within normal limits  D-DIMER, QUANTITATIVE (NOT AT [facility]) - Abnormal; Notable for the following components:   D-Dimer, Quant 2.30 (*)    All other  components within normal limits  MAGNESIUM  I-STAT TROPONIN, ED  I-STAT BETA HCG BLOOD, ED ([facility], [facility], [facility] ONLY)   Results for orders placed or performed during the hospital encounter of 68/34/19  Basic metabolic panel  Result Value Ref Range   Sodium 137 135 - 145 mmol/L   Potassium 3.1 (L) 3.5 - 5.1 mmol/L   Chloride 95 (L) 98 - 111 mmol/L   CO2 25 22 - 32 mmol/L   Glucose, Bld 112 (H) 70 - 99 mg/dL   BUN <5 (L) 6 - 20 mg/dL   Creatinine, Ser 0.77 0.44 - 1.00 mg/dL   Calcium 9.1 8.9 - 10.3 mg/dL   GFR calc non Af Amer >60 >60 mL/min   GFR calc Af Amer >60 >60 mL/min   Anion gap 17 (H) 5 - 15  CBC  Result Value Ref Range   WBC 4.5 4.0 - 10.5 K/uL   RBC 3.89 3.87 - 5.11 MIL/uL   Hemoglobin 10.8 (L) 12.0 - 15.0 g/dL   HCT 33.9 (L) 36.0 - 46.0 %   MCV 87.1 80.0 - 100.0 fL   MCH 27.8 26.0 - 34.0 pg   MCHC 31.9 30.0 - 36.0 g/dL   RDW 17.9 (H) 11.5 - 15.5 %   Platelets 277 150 - 400 K/uL   nRBC 0.0 0.0 - 0.2 %  D-dimer, quantitative (not at [facility])  Result Value Ref Range   D-Dimer, Quant 2.30 (H) 0.00 - 0.50 ug/mL-FEU  Magnesium  Result Value Ref Range   Magnesium 1.7 1.7 - 2.4 mg/dL  I-stat troponin, ED  Result Value Ref Range   Troponin i, poc 0.02 0.00 - 0.08 ng/mL   Comment 3          I-Stat beta hCG blood, ED  Result Value Ref Range   I-stat hCG, quantitative <5.0 <5 mIU/mL   Comment 3          I-STAT 7, (LYTES, BLD GAS, ICA, H+H)  Result Value Ref Range   pH, Arterial 7.482 (H) 7.350 - 7.450   pCO2 arterial 29.4 (L) 32.0 - 48.0 mmHg   pO2, Arterial 68.0 (L) 83.0 - 108.0 mmHg   Bicarbonate 22.1 20.0 - 28.0 mmol/L   TCO2 23 22 - 32 mmol/L   O2 Saturation 95.0 %   Acid-base deficit 1.0 0.0 - 2.0 mmol/L   Sodium 138 135 - 145 mmol/L   Potassium 3.3 (L) 3.5 - 5.1 mmol/L   Calcium, Ion 1.06 (L) 1.15 - 1.40 mmol/L   HCT  28.0 (L) 36.0 - 46.0 %   Hemoglobin 9.5 (L) 12.0 - 15.0 g/dL   Patient temperature 98.1 F    Collection site RADIAL, ALLEN'S TEST ACCEPTABLE    Drawn  by RT    Sample type ARTERIAL     EKG EKG Interpretation  Date/Time:  Monday May 08 2018 01:07:02 EST Ventricular Rate:  128 PR Interval:  142 QRS Duration: 86 QT Interval:  304 QTC Calculation: 443 R Axis:   10 Text Interpretation:  Sinus tachycardia Right atrial enlargement Nonspecific ST and T wave abnormality Abnormal ECG Baseline wander improved  NO STEMI Confirmed by Addison Lank [phone]) on 05/08/2018 1:11:21 AM   Radiology  Dg Chest 2 View  Result Date: 05/08/2018 CLINICAL DATA:  Chest pain, shortness of Breath EXAM: CHEST - 2 VIEW COMPARISON:  04/25/2018 FINDINGS: Heart and mediastinal contours are within normal limits. No focal opacities or effusions. No acute bony abnormality. IMPRESSION: No active cardiopulmonary disease. Electronically Signed   By: Rolm Baptise M.D.   On: 05/08/2018 01:24   Dg Chest 2 View  Result Date: 04/25/2018 CLINICAL DATA:  Nausea and fatigue for 8 months. EXAM: CHEST - 2 VIEW COMPARISON:  None. FINDINGS: The heart size and mediastinal contours are within normal limits. Both lungs are clear. The visualized skeletal structures are unremarkable. IMPRESSION: No active cardiopulmonary disease. Electronically Signed   By: Ashley Royalty M.D.   On: 04/25/2018 01:02   Ct Angio Chest Pe W And/or Wo Contrast  Result Date: 05/08/2018 CLINICAL DATA:  38 y/o F; swelling of left leg with no known injury. Shortness of breath and chest tightness. EXAM: CT ANGIOGRAPHY CHEST WITH CONTRAST TECHNIQUE: Multidetector CT imaging of the chest was performed using the standard protocol during bolus administration of intravenous contrast. Multiplanar CT image reconstructions and MIPs were obtained to evaluate the vascular anatomy. CONTRAST:  55 cc Isovue 370 COMPARISON:  05/08/2018 chest radiograph. FINDINGS: Cardiovascular: Acute right lobar and bilateral segmental pulmonary emboli. RV/LV = 1.5. Normal caliber thoracic aorta. Mediastinum/Nodes: No enlarged mediastinal,  hilar, or axillary lymph nodes. Thyroid gland, trachea, and esophagus demonstrate no significant findings. Lungs/Pleura: Lungs are clear. No pleural effusion or pneumothorax. Upper Abdomen: No acute abnormality. Musculoskeletal: No chest wall abnormality. No acute or significant osseous findings. Review of the MIP images confirms the above findings. IMPRESSION: Positive for acute PE with CTevidence of right heart strain (RV/LV Ratio = 1.5) consistent with at least submassive (intermediate risk) PE. The presence of right heart strain has been associated with an increased risk of morbidity and mortality. Critical Value/emergent results were called by telephone at the time of interpretation on 05/08/2018 at 4:29 am to Dr. Pryor Curia , who verbally acknowledged these results. Electronically Signed   By: Kristine Garbe M.D.   On: 05/08/2018 04:30    Procedures Procedures (including critical care time) CRITICAL CARE Performed by: Dewaine Oats   Total critical care time: 40 minutes  Critical care time was exclusive of separately billable procedures and treating other patients.  Critical care was necessary to treat or prevent imminent or life-threatening deterioration.  Critical care was time spent personally by me on the following activities: development of treatment plan with patient and/or surrogate as well as nursing, discussions with consultants, evaluation of patient's response to treatment, examination of patient, obtaining history from patient or surrogate, ordering and performing treatments and interventions, ordering and review of laboratory studies, ordering and review of radiographic studies, pulse oximetry and re-evaluation of patient's condition.  Medications  Ordered in ED Medications  sodium chloride flush (NS) 0.9 % injection 3 mL (has no administration in time range)  iopamidol (ISOVUE-370) 76 % injection (has no administration in time range)  potassium chloride 10 mEq in  100 mL IVPB (10 mEq Intravenous New Bag/Given 05/08/18 0309)  potassium chloride SA (K-DUR,KLOR-CON) CR tablet 40 mEq (40 mEq Oral Given 05/08/18 0303)     Initial Impression / Assessment and Plan / ED Course  I have reviewed the triage vital signs and the nursing notes.  Pertinent labs & imaging results that were available during my care of the patient were reviewed by me and considered in my medical decision making (see chart for details).     Patient to ED with weeks of SOB, new onset left LE swelling yesterday, prompting ED visit.  She is found to be significantly tachycardic, tachypneic. Left LE swollen but nontender without redness. Her EKG is significant for nonspecific S and ST abnormalities. Strongly suspect PE. Patient's only risk factor appears to be brother with history of clots.   CT confirms bilateral PE's with evidence of right heart strain. On re-examination, the patient appears more comfortable, continues to be tachycardic. Potassium and magnesium low, supplement ordered. Heparin per pharmacy protocol started.   Dr. Leonides Schanz has seen the patient and updated her on plan to admit. Discussed with dr. Alcario Drought, [facility], who accepts the patient for admission.   Final Clinical Impressions(s) / ED Diagnoses   Final diagnoses:  None   1. Bilateral PE  ED Discharge Orders    None       Charlann Lange, PA-C 05/08/18 2080    Ward, Delice Bison, DO 05/08/18 [phone]

## 2018-05-08 NOTE — ED Notes (Signed)
Breakfast Tray ordered  

## 2018-05-08 NOTE — Consult Note (Signed)
NAMETeresea Graham, MRN:  [id number], DOB:  May 26, 1980, LOS: 0 ADMISSION DATE:  05/08/2018, CONSULTATION DATE: February 10 REFERRING MD: Dr. Alcario Drought, CHIEF COMPLAINT: Pulmonary realism  Brief History   38 year old female with no significant past medical history, no chemical contraception, admitted for pulmonary embolism with RV strain on CT.  History of present illness   38 year old female with no significant past medical history.  She does report a remote admission to a hospital in [place] for bradycardia of which an etiology was never discovered.  Since that time she reports a healthy life and that she is reasonably active.  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  She does not utilize any chemical contraception methods.  She reports her brother has had pulmonary embolism in his 22s and as far she knows this has not recurred.  She does not know of any other family history of clots.  She presented to [facility] emergency department on 2/10 with complaints of 3 to 4 weeks of intermittent shortness of breath with associated productive cough.  On the day of presentation she felt dizzy and felt as though she may pass out.  On that same day she noticed her left lower extremity to be edematous.  For these reason she presented to the emergency department.  Given her constellation of symptoms and clear chest x-ray she was sent for CT angiogram of the chest.  This demonstrated acute right lobar and bilateral segmental pulmonary emboli with CT evidence of right heart strain.  RV to LV ratio 1.5.  She was tachycardic but normotensive and maintaining O2 saturation 100% on room air.  She was started on heparin infusion and admitted to the hospital service.  PCCM was asked to consult.  Past Medical History    Significant Hospital Events   2/10 admit  Consults:    Procedures:    Significant Diagnostic Tests:  CTA chest 2/10 > Acute right lobar and bilateral  segmental pulmonary emboli. RV/LV = 1.5.  Micro Data:    Antimicrobials:    Interim history/subjective:    Objective   Blood pressure (!) 152/104, pulse (!) 129, temperature 98.1 F (36.7 C), temperature source Oral, resp. rate (!) 26, height 5\' 3"  (1.6 m), weight 63.5 kg, last menstrual period 05/04/2018, SpO2 98 %.        Intake/Output Summary (Last 24 hours) at 05/08/2018 0554 Last data filed at 05/08/2018 0553 Gross per 24 hour  Intake 200 ml  Output -  Net 200 ml   Filed Weights   05/08/18 0059  Weight: 63.5 kg    Examination: General: adult female resting in bed. NAD HENT: /AT, PERRL, no JVD. Hirsutism.  Lungs: Clear bilateral breath sounds. Mild splinting with deep inspiration.  Cardiovascular: Tachy, regular, no MRG Extremities: LLE edema from knee to groin. Warm to touch Neuro: Alert, oriented, non-focal  Resolved Hospital Problem list     Assessment & Plan:   Acute pulmonary embolism:  Unprovoked right lobar and bilateral segmental PE. Right heart strain by CT with RV/LV 1.5. She has no risk factors like contraception, sedentary lifestyle, or seated travel. There may be a hereditary component as her brother has had PE in the past about 10 years ago. Clinically, she is stable with O2 sats 100% on Room air. She is tachycardic, but has never been hypotensive. In fact, she is hypertensive.  Plan: Continue heparin infusion Admit to PCU under hospitalist service Supplemental O2  PRN to keep SpO2 > 92% (currently 100% on RA) Telemetry monitoring Echocardiogram Left lower extremity doppler Assuming R heart strain on CT is reflected on echo, she is likely a good candidate for catheter directed lysis.  Will require outpatient heme workup to rule out hereditary hypercoagulability   Respiratory alkalosis: due to hyperventilation in the setting of PE  Anemia: no signs or complaints of acute bleeding - Trend CBC, Coags  Hyperkalemia - Per primary   Best  practice:  Diet: NPO pending echo and possible IR procedure.  Pain/Anxiety/Delirium protocol (if indicated): Na VAP protocol (if indicated): Na DVT prophylaxis: full dose AC (heparin) GI prophylaxis: Na Glucose control: Na Mobility: BR Code Status: FULL Family Communication: Patient updated in ED Disposition: PCU/TRH  Labs   CBC: Recent Labs  Lab 05/08/18 0110 05/08/18 0543  WBC 4.5  --   HGB 10.8* 9.5*  HCT 33.9* 28.0*  MCV 87.1  --   PLT 277  --     Basic Metabolic Panel: Recent Labs  Lab 05/08/18 0110 05/08/18 0543  NA 137 138  K 3.1* 3.3*  CL 95*  --   CO2 25  --   GLUCOSE 112*  --   BUN <5*  --   CREATININE 0.77  --   CALCIUM 9.1  --   MG 1.7  --    GFR: Estimated Creatinine Clearance: 85.5 mL/min (by C-G formula based on SCr of 0.77 mg/dL). Recent Labs  Lab 05/08/18 0110  WBC 4.5    Liver Function Tests: No results for input(s): AST, ALT, ALKPHOS, BILITOT, PROT, ALBUMIN in the last 168 hours. No results for input(s): LIPASE, AMYLASE in the last 168 hours. No results for input(s): AMMONIA in the last 168 hours.  ABG    Component Value Date/Time   PHART 7.482 (H) 05/08/2018 0543   PCO2ART 29.4 (L) 05/08/2018 0543   PO2ART 68.0 (L) 05/08/2018 0543   HCO3 22.1 05/08/2018 0543   TCO2 23 05/08/2018 0543   ACIDBASEDEF 1.0 05/08/2018 0543   O2SAT 95.0 05/08/2018 0543     Coagulation Profile: No results for input(s): INR, PROTIME in the last 168 hours.  Cardiac Enzymes: No results for input(s): CKTOTAL, CKMB, CKMBINDEX, TROPONINI in the last 168 hours.  HbA1C: No results found for: HGBA1C  CBG: No results for input(s): GLUCAP in the last 168 hours.  Review of Systems:   Bolds are positive  Constitutional: weight loss, gain, night sweats, Fevers, chills, fatigue .  HEENT: headaches, Sore throat, sneezing, nasal congestion, post nasal drip, Difficulty swallowing, Tooth/dental problems, visual complaints visual changes, ear ache CV:  chest  pain, radiates:,Orthopnea, PND, swelling in lower extremities(left), dizziness, palpitations, syncope.  GI  heartburn, indigestion, abdominal pain, nausea, vomiting, diarrhea, change in bowel habits, loss of appetite, bloody stools.  Resp: cough, productive:"mucous", hemoptysis, dyspnea, chest pain, pleuritic.  Skin: rash or itching or icterus GU: dysuria, change in color of urine, urgency or frequency. flank pain, hematuria  MS: joint pain or swelling. decreased range of motion  Psych: change in mood or affect. depression or anxiety.  Neuro: difficulty with speech, weakness, numbness, ataxia    Past Medical History  She,  has no past medical history on file.   Surgical History   History reviewed. No pertinent surgical history.   Social History   reports that she has never smoked. She has never used smokeless tobacco. She reports current alcohol use. She reports that she does not use drugs.   Family History  Her family history includes Pulmonary embolism in her brother.   Allergies No Known Allergies   Home Medications  Prior to Admission medications   Not on File     Critical care time:     Georgann Housekeeper, [facility] Pager [phone] or [phone]  05/08/2018 6:14 AM

## 2018-05-08 NOTE — Progress Notes (Signed)
  [place] TEAM 1 - Stepdown/ICU TEAM  Annalyssa Hoggard  ZOX:[id number] DOB: [birth year] DOA: 05/08/2018 PCP: Patient, No Pcp Per    Brief Narrative:  38 y.o. female with no signif med hx who presented with worsening SOB over a 3-4 weeks period. She decided to come to the ER when she also developed swelling in the L leg. She has a brother w/ a hx of PE.   In the ED CTa confirmed a PE.   Significant Events: 2/10 admit   Subjective: Pt admitted/examined by TRH earlier today.    Assessment & Plan:  Acute PE with cor pulmonale PCCM recommended IR eval for possible EKOS, but it does not appear that PCCM called or ordered this consult - I have entered an order for an IR eval  Hypokalemia   DVT prophylaxis: IV heparin  Code Status: FULL CODE Family Communication:  Disposition Plan:   Consultants:  PCCM  Antimicrobials:  none   Objective: Blood pressure (!) 145/116, pulse 85, temperature 98.1 F (36.7 C), temperature source Oral, resp. rate (!) 28, height 5\' 3"  (1.6 m), weight 66.6 kg, last menstrual period 05/04/2018, SpO2 98 %.  Intake/Output Summary (Last 24 hours) at 05/08/2018 1346 Last data filed at 05/08/2018 0702 Gross per 24 hour  Intake 300 ml  Output -  Net 300 ml   Filed Weights   05/08/18 0059 05/08/18 1109  Weight: 63.5 kg 66.6 kg    Examination: F/u only - no exam   CBC: Recent Labs  Lab 05/08/18 0110 05/08/18 0543  WBC 4.5  --   HGB 10.8* 9.5*  HCT 33.9* 28.0*  MCV 87.1  --   PLT 277  --    Basic Metabolic Panel: Recent Labs  Lab 05/08/18 0110 05/08/18 0543  NA 137 138  K 3.1* 3.3*  CL 95*  --   CO2 25  --   GLUCOSE 112*  --   BUN <5*  --   CREATININE 0.77  --   CALCIUM 9.1  --   MG 1.7  --    GFR: Estimated Creatinine Clearance: 87.5 mL/min (by C-G formula based on SCr of 0.77 mg/dL).  Liver Function Tests: No results for input(s): AST, ALT, ALKPHOS, BILITOT, PROT, ALBUMIN in the last 168 hours. No results for input(s): LIPASE,  AMYLASE in the last 168 hours. No results for input(s): AMMONIA in the last 168 hours.  Coagulation Profile: No results for input(s): INR, PROTIME in the last 168 hours.  Cardiac Enzymes: No results for input(s): CKTOTAL, CKMB, CKMBINDEX, TROPONINI in the last 168 hours.  HbA1C: No results found for: HGBA1C  CBG: No results for input(s): GLUCAP in the last 168 hours.  No results found for this or any previous visit (from the past 240 hour(s)).   Scheduled Meds: . iopamidol      . metoprolol tartrate  2.5 mg Intravenous Q6H  . sodium chloride flush  3 mL Intravenous Once   Continuous Infusions: . heparin 1,000 Units/hr (05/08/18 1305)     LOS: 0 days   Time spent: No Charge  Cherene Altes, MD [facility] Office  [phone] Pager - Text Page per Amion as per below:  On-Call/Text Page:      [email or web address]  If 7PM-7AM, please contact night-coverage [email or web address] 05/08/2018, 1:46 PM

## 2018-05-09 DIAGNOSIS — D219 Benign neoplasm of connective and other soft tissue, unspecified: Secondary | ICD-10-CM

## 2018-05-09 LAB — BASIC METABOLIC PANEL
Anion gap: 9 (ref 5–15)
BUN: <5 mg/dL — ABNORMAL LOW (ref 6–20)
CO2: 24 mmol/L (ref 22–32)
Calcium: 8.6 mg/dL — ABNORMAL LOW (ref 8.9–10.3)
Chloride: 104 mmol/L (ref 98–111)
Creatinine, Ser: 0.75 mg/dL (ref 0.44–1.00)
GFR calc non Af Amer: >60 mL/min (ref >60)
Glucose, Bld: 103 mg/dL — ABNORMAL HIGH (ref 70–99)
Potassium: 3.3 mmol/L — ABNORMAL LOW (ref 3.5–5.1)
Sodium: 137 mmol/L (ref 135–145)

## 2018-05-09 LAB — CBC
HCT: 29.2 % — ABNORMAL LOW (ref 36.0–46.0)
Hemoglobin: 9.4 g/dL — ABNORMAL LOW (ref 12.0–15.0)
MCH: 27.6 pg (ref 26.0–34.0)
MCHC: 32.2 g/dL (ref 30.0–36.0)
MCV: 85.9 fL (ref 80.0–100.0)
Platelets: 243 10*3/uL (ref 150–400)
RBC: 3.4 MIL/uL — ABNORMAL LOW (ref 3.87–5.11)
RDW: 17.9 % — ABNORMAL HIGH (ref 11.5–15.5)
WBC: 5.3 10*3/uL (ref 4.0–10.5)
nRBC: 0 % (ref 0.0–0.2)

## 2018-05-09 LAB — MAGNESIUM: Magnesium: 1.8 mg/dL (ref 1.7–2.4)

## 2018-05-09 LAB — HEPARIN LEVEL (UNFRACTIONATED): Heparin Unfractionated: 0.53 IU/mL (ref 0.30–0.70)

## 2018-05-09 LAB — MRSA PCR SCREENING: MRSA by PCR: NEGATIVE

## 2018-05-09 MED ORDER — METOPROLOL TARTRATE 12.5 MG HALF TABLET
12.5000 mg | ORAL_TABLET | Freq: Two times a day (BID) | ORAL | Status: DC
  Administered 2018-05-09 – 2018-05-18 (×17): 12.5 mg via ORAL
  Filled 2018-05-09 (×18): qty 1

## 2018-05-09 MED ORDER — ACETAMINOPHEN 325 MG PO TABS: 650.0000 mg | ORAL_TABLET | Freq: Four times a day (QID) | ORAL | Status: DC | PRN

## 2018-05-09 MED ORDER — OXYCODONE HCL 5 MG PO TABS
5.0000 mg | ORAL_TABLET | ORAL | Status: DC | PRN
  Administered 2018-05-09: 5 mg via ORAL
  Administered 2018-05-09 – 2018-05-12 (×8): 10 mg via ORAL
  Administered 2018-05-13 (×2): 5 mg via ORAL
  Administered 2018-05-13 – 2018-05-18 (×10): 10 mg via ORAL
  Filled 2018-05-09: qty 2
  Filled 2018-05-09: qty 1
  Filled 2018-05-09 (×2): qty 2
  Filled 2018-05-09: qty 1
  Filled 2018-05-09 (×13): qty 2
  Filled 2018-05-09: qty 1
  Filled 2018-05-09 (×2): qty 2

## 2018-05-09 MED ORDER — ADULT MULTIVITAMIN W/MINERALS CH
1.0000 | ORAL_TABLET | Freq: Every day | ORAL | Status: DC
  Administered 2018-05-09 – 2018-05-18 (×9): 1 via ORAL
  Filled 2018-05-09 (×9): qty 1

## 2018-05-09 MED ORDER — TRAMADOL HCL 50 MG PO TABS
50.0000 mg | ORAL_TABLET | Freq: Four times a day (QID) | ORAL | Status: DC | PRN
  Administered 2018-05-09: 50 mg via ORAL
  Filled 2018-05-09: qty 1

## 2018-05-09 MED ORDER — POTASSIUM CHLORIDE CRYS ER 20 MEQ PO TBCR
40.0000 meq | EXTENDED_RELEASE_TABLET | Freq: Once | ORAL | Status: AC
  Administered 2018-05-09: 40 meq via ORAL
  Filled 2018-05-09: qty 2

## 2018-05-09 NOTE — Progress Notes (Signed)
ANTICOAGULATION CONSULT NOTE - Follow Up Consult  Pharmacy Consult for Heparin Indication: pulmonary embolus  No Known Allergies  Patient Measurements: Height: 5\' 3"  (160 cm) Weight: 146 lb 12.8 oz (66.6 kg) IBW/kg (Calculated) : 52.4 Heparin Dosing Weight: 66.6 kg  Vital Signs: Temp: 97.7 F (36.5 C) (02/11 0737) Temp Source: Oral (02/11 0737) BP: 138/106 (02/11 0737) Pulse Rate: 74 (02/11 0737)  Labs: Recent Labs    05/08/18 0110 05/08/18 0543 05/08/18 1112 05/08/18 1933 05/09/18 0247  HGB 10.8* 9.5*  --   --  9.4*  HCT 33.9* 28.0*  --   --  29.2*  PLT 277  --   --   --  243  HEPARINUNFRC  --   --  0.83* 0.68 0.53  CREATININE 0.77  --   --   --  0.75    Estimated Creatinine Clearance: 87.5 mL/min (by C-G formula based on SCr of 0.75 mg/dL).  Assessment:  38yo female c/o swelling to LLE since 1400 Sun followed by SOB and chest tightness, CT reveals bilateral PE w/ evidence of right heart strain. No evidence of right heart strain on echo 2/10. 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  Pharmacy consulted for IV heparin dosing.    Heparin level is therapeutic (0.53) on 950 units/hr.  MRI planned for 2/13.  Goal of Therapy:  Heparin level 0.3-0.7 units/ml Monitor platelets by anticoagulation protocol: Yes   Plan:   Continue heparin drip at 950 units/hr  Daily heparin level and CBC.  Follow up oral anticoagulant plans after procedures.  Arty Baumgartner, [place] Pager: [phone] or phone: [phone] 05/09/2018,10:16 AM

## 2018-05-09 NOTE — Progress Notes (Addendum)
[facility] TEAM 1 - Stepdown/ICU TEAM  Serai Hoggard  UMP:[id number] DOB: [birth year] DOA: 05/08/2018 PCP: Patient, No Pcp Per    Brief Narrative:  38 y.o. female with no signif med hx who presented with worsening SOB over a 3-4 weeks period. She decided to come to the ER when she also developed swelling in the L leg. She has a brother w/ a hx of PE.   In the ED CTa confirmed a PE.   Significant Events: 2/10 admit   Subjective: The patient reports ongoing chest pain of a pleuritic nature.  She also feels short of breath with physical exertion.  She denies abdominal pain nausea or vomiting.  She does report ongoing swelling in her left leg.  She relates no known prior history of uterine fibroids and no dysfunctional uterine bleeding.  Assessment & Plan:  Acute B PE with cor pulmonale suggested on CTa pt reports hx of "clots" in a brother as well as an aunt - she will definitely need a full hypercoagulable w/u when she can safely have a short blood thinner holiday (perhaps after 6 months of full anticoag) - also possibility that her DVT could be explained by extrinsic compression of her L iliac by a fibroid (discussed below) - TTE reveals no evidence of R heart strain therefore EKOS does not appear to be indicated - IR has evaluated for same - delay transition to oral anticoag until anatomical situation in the pelvis more clearly delineated  L Iliac Vein thrombosis  Cont anticoag - ?due to compression of vein by uterine fibroid - Radiology suggests MRI to better eval - pt too SOB to tolerate MRI today - will attempt tomorrow if she is more stable   Hypokalemia  Supplement and follow   DVT prophylaxis: IV heparin  Code Status: FULL CODE Family Communication: no family present  Disposition Plan: stable for tele bed status   Consultants:  PCCM IR  Antimicrobials:  none   Objective: Blood pressure (!) 138/106, pulse 74, temperature 97.7 F (36.5 C), temperature source Oral, resp.  rate 18, height 5\' 3"  (1.6 m), weight 66.6 kg, last menstrual period 05/04/2018, SpO2 98 %.  Intake/Output Summary (Last 24 hours) at 05/09/2018 0956 Last data filed at 05/09/2018 0400 Gross per 24 hour  Intake 100 ml  Output -  Net 100 ml   Filed Weights   05/08/18 0059 05/08/18 1109  Weight: 63.5 kg 66.6 kg    Examination: General: No acute respiratory distress Lungs: Clear to auscultation bilaterally without wheezes or crackles Cardiovascular: Regular rate and rhythm without murmur gallop or rub normal S1 and S2 Abdomen: Nontender, nondistended, soft, bowel sounds positive, no rebound, no ascites, no appreciable mass Extremities: No significant cyanosis, clubbing, or edema bilateral lower extremities   CBC: Recent Labs  Lab 05/08/18 0110 05/08/18 0543 05/09/18 0247  WBC 4.5  --  5.3  HGB 10.8* 9.5* 9.4*  HCT 33.9* 28.0* 29.2*  MCV 87.1  --  85.9  PLT 277  --  400   Basic Metabolic Panel: Recent Labs  Lab 05/08/18 0110 05/08/18 0543 05/09/18 0247  NA 137 138 137  K 3.1* 3.3* 3.3*  CL 95*  --  104  CO2 25  --  24  GLUCOSE 112*  --  103*  BUN <5*  --  <5*  CREATININE 0.77  --  0.75  CALCIUM 9.1  --  8.6*  MG 1.7  --  1.8   GFR: Estimated Creatinine Clearance: 87.5 mL/min (by  C-G formula based on SCr of 0.75 mg/dL).  Liver Function Tests: No results for input(s): AST, ALT, ALKPHOS, BILITOT, PROT, ALBUMIN in the last 168 hours. No results for input(s): LIPASE, AMYLASE in the last 168 hours. No results for input(s): AMMONIA in the last 168 hours.   Scheduled Meds: . feeding supplement (ENSURE ENLIVE)  237 mL Oral BID BM  . metoprolol tartrate  2.5 mg Intravenous Q6H  . sodium chloride flush  3 mL Intravenous Once   Continuous Infusions: . heparin 950 Units/hr (05/09/18 0457)     LOS: 1 day    Cherene Altes, MD [facility] Office  [phone] Pager - Text Page per Shea Evans as per below:  On-Call/Text Page:      [email or web address]  If 7PM-7AM,  please contact night-coverage [email or web address] 05/09/2018, 9:56 AM

## 2018-05-09 NOTE — Progress Notes (Signed)
Initial Nutrition Assessment  DOCUMENTATION CODES:   Not applicable  INTERVENTION:   - Snacks BID (RD ordered via [facility])  - MVI with minerals daily  - Provided education regarding the importance of adequate kcal intake with a focus on protein-rich foods to maintain lean muscle mass  - d/c Ensure Enlive  NUTRITION DIAGNOSIS:   Inadequate oral intake related to poor appetite, vomiting as evidenced by per patient/family report.  GOAL:   Patient will meet greater than or equal to 90% of their needs  MONITOR:   PO intake, Labs, Weight trends  REASON FOR ASSESSMENT:   Malnutrition Screening Tool    ASSESSMENT:   38 year old female who presented to the ED on 2/10 with SOB, chest pain, and leg swelling. No significant PMH. Pt admitted with acute PE.  Spoke with pt at bedside. Pt with untouched lunch meal tray at bedside (salad with chicken, fruit). Pt states that she is going to try to eat some lunch. RD encouraged pt to eat protein-rich foods (chicken) first.  Pt states that she ate half of a bacon, egg, and cheese bagel sandwich from [employer] this morning.  Pt reports that her appetite is poor and that it has been poor over the last 7-8 months. Pt is unsure why her appetite decreased. Pt also reports that she vomits after most meals due to feeling "food get stuck." Pt states that she knows she is going to vomit "when my mouth starts to water."  Pt states that she does a lot of cooking and "tastes" as she goes. Pt shares that she does not eat "full meals" but eats "here and there" throughout the day. Pt states that she does not typically eat breakfast.  Pt endorses weight loss over the last 1 year. Pt reports that 1 year ago, she weighed 175 lbs and that she weighed 141 lbs the last time she weighed herself. Weighed on admission was 146.8 lbs. Weight history in chart is limited with only other weight recorded on 04/25/18 as 64 kg.  Pt states that she will not  drinking oral nutrition supplements because they "make me feel sick." Pt is amenable to receiving snacks BID (fruit). RD will also order MVI daily.  Medications reviewed and include: Ensure Enlive BID, heparin  Labs reviewed: potassium 3.3 (L), hemoglobin 9.4 (L)  NUTRITION - FOCUSED PHYSICAL EXAM:    Most Recent Value  Orbital Region  No depletion  Upper Arm Region  Mild depletion  Thoracic and Lumbar Region  No depletion  Buccal Region  No depletion  Temple Region  No depletion  Clavicle Bone Region  No depletion  Clavicle and Acromion Bone Region  Mild depletion  Scapular Bone Region  No depletion  Dorsal Hand  Mild depletion  Patellar Region  No depletion  Anterior Thigh Region  No depletion  Posterior Calf Region  No depletion  Edema (RD Assessment)  None  Hair  Reviewed  Eyes  Reviewed  Mouth  Reviewed  Skin  Reviewed  Nails  Reviewed       Diet Order:   Diet Order            Diet regular Room service appropriate? Yes; Fluid consistency: Thin  Diet effective now              EDUCATION NEEDS:   Education needs have been addressed  Skin:  Skin Assessment: Reviewed RN Assessment  Last BM:  2/10  Height:   Ht Readings from Last 1 Encounters:  05/08/18 5\' 3"  (1.6 m)    Weight:   Wt Readings from Last 1 Encounters:  05/08/18 66.6 kg    Ideal Body Weight:  52.3 kg  BMI:  Body mass index is 26 kg/m.  Estimated Nutritional Needs:   Kcal:  1700-1900   Protein:  80-95 grams  Fluid:  1.7-1.9 L    Gaynell Face, MS, RD, LDN Inpatient Clinical Dietitian Pager: [phone] Weekend/After Hours: [phone]

## 2018-05-09 NOTE — Progress Notes (Signed)
Referring Physician(s): Dr Thereasa Solo  Supervising Physician: Markus Daft  Patient Status:  [facility]  Chief Complaint:  SOB +PE  Subjective:  Dr Anselm Pancoast examined pt today in room She states she is breathing easier Less chest pain still with some soreness in chest but much less pain Not requiring O2  Echo:  FINDINGS  Left Ventricle: The left ventricle has normal systolic function of 56-38%. The cavity size was normal. There is no increased left ventricular wall thickness. Echo evidence of impaired diastolic relaxation Right Ventricle: The right ventricle has normal systolic function. The cavity was normal. There is no increase in right ventricular wall thickness. Left Atrium: left atrial size was normal in size Right Atrium: right atrial size was normal in size Interatrial Septum: No atrial level shunt detected by color flow Doppler.  Pericardium: There is no evidence of pericardial effusion. Mitral Valve: The mitral valve is normal in structure. There is mild thickening. Mitral valve regurgitation is not visualized by color flow Doppler. Tricuspid Valve: The tricuspid valve is normal in structure. Tricuspid valve regurgitation is moderate by color flow Doppler. Aortic Valve: The aortic valve is normal in structure. Aortic valve regurgitation was not visualized by color flow Doppler. Pulmonic Valve: The pulmonic valve was normal in structure. Pulmonic valve regurgitation is trivial by color flow Doppler. Venous: The inferior vena cava is normal in size with less than 50% respiratory variability.  Allergies: Patient has no known allergies.  Medications: Prior to Admission medications   Not on File     Vital Signs: BP (!) 138/106 (BP Location: Left Arm)   Pulse 74   Temp 97.7 F (36.5 C) (Oral)   Resp 18   Ht 5\' 3"  (1.6 m)   Wt 146 lb 12.8 oz (66.6 kg)   LMP 05/04/2018   SpO2 98%   BMI 26.00 kg/m   Physical Exam Vitals signs reviewed.  Constitutional:    Comments: NAD  Pulmonary:     Effort: Pulmonary effort is normal.     Breath sounds: Normal breath sounds.     Comments: 98 % RA Neurological:     Mental Status: She is alert and oriented to person, place, and time.  Psychiatric:        Mood and Affect: Mood normal.        Behavior: Behavior normal.     Imaging: Dg Chest 2 View  Result Date: 05/08/2018 CLINICAL DATA:  Chest pain, shortness of Breath EXAM: CHEST - 2 VIEW COMPARISON:  04/25/2018 FINDINGS: Heart and mediastinal contours are within normal limits. No focal opacities or effusions. No acute bony abnormality. IMPRESSION: No active cardiopulmonary disease. Electronically Signed   By: Rolm Baptise M.D.   On: 05/08/2018 01:24   Ct Angio Chest Pe W And/or Wo Contrast  Result Date: 05/08/2018 CLINICAL DATA:  38 y/o F; swelling of left leg with no known injury. Shortness of breath and chest tightness. EXAM: CT ANGIOGRAPHY CHEST WITH CONTRAST TECHNIQUE: Multidetector CT imaging of the chest was performed using the standard protocol during bolus administration of intravenous contrast. Multiplanar CT image reconstructions and MIPs were obtained to evaluate the vascular anatomy. CONTRAST:  55 cc Isovue 370 COMPARISON:  05/08/2018 chest radiograph. FINDINGS: Cardiovascular: Acute right lobar and bilateral segmental pulmonary emboli. RV/LV = 1.5. Normal caliber thoracic aorta. Mediastinum/Nodes: No enlarged mediastinal, hilar, or axillary lymph nodes. Thyroid gland, trachea, and esophagus demonstrate no significant findings. Lungs/Pleura: Lungs are clear. No pleural effusion or pneumothorax. Upper Abdomen: No acute  abnormality. Musculoskeletal: No chest wall abnormality. No acute or significant osseous findings. Review of the MIP images confirms the above findings. IMPRESSION: Positive for acute PE with CTevidence of right heart strain (RV/LV Ratio = 1.5) consistent with at least submassive (intermediate risk) PE. The presence of right heart strain  has been associated with an increased risk of morbidity and mortality. Critical Value/emergent results were called by telephone at the time of interpretation on 05/08/2018 at 4:29 am to Dr. Pryor Curia , who verbally acknowledged these results. Electronically Signed   By: Kristine Garbe M.D.   On: 05/08/2018 04:30   US Pelvis (transabdominal Only)  Result Date: 05/08/2018 CLINICAL DATA:  Clinical concern for a large fibroid. EXAM: TRANSABDOMINAL ULTRASOUND OF PELVIS TECHNIQUE: Transabdominal ultrasound examination of the pelvis was performed including evaluation of the uterus, ovaries, adnexal regions, and pelvic cul-de-sac. COMPARISON:  None. FINDINGS: Uterus Measurements: 13.4 x 8.5 x 10.1 cm = volume: 602 mL. There is a large intramural myometrial mass within the fundus of the uterus measuring 9.5 by 11.2 by 8.2 cm. Endometrium The endometrium is poorly visualized. Right ovary Measurements: 5.5 x 5.2 x 3.3 cm = volume: 49.3 mL. Several benign-appearing cysts, the largest measuring 2.7 cm. Left ovary Measurements: 3.6 x 4.3 x 2.6 cm = volume: 21 mL. Normal appearance/no adnexal mass. Other findings:  No abnormal free fluid. IMPRESSION: Enlarged uterus containing a large myometrial mass within the fundus which measures 11.2 cm in greatest dimension. If surgical intervention is not planned, further evaluation with contrast-enhanced MRI of the pelvis may be considered. Normal appearance of the ovaries. Electronically Signed   By: Fidela Salisbury M.D.   On: 05/08/2018 22:23   Vas [place] Lower Extremity Venous (dvt)  Result Date: 05/08/2018  Lower Venous Study Risk Factors: Confirmed PE. Comparison Study: No prior study on file for comparison Performing Technologist: Sharion Dove RVS Supporting Technologist: June Leap RDMS, RVT  Examination Guidelines: A complete evaluation includes B-mode imaging, spectral Doppler, color Doppler, and power Doppler as needed of all accessible portions of each  vessel. Bilateral testing is considered an integral part of a complete examination. Limited examinations for reoccurring indications may be performed as noted.  Right Venous Findings: +---------+---------------+---------+-----------+----------+-------+          CompressibilityPhasicitySpontaneityPropertiesSummary +---------+---------------+---------+-----------+----------+-------+ CFV      Full           Yes      Yes                          +---------+---------------+---------+-----------+----------+-------+ SFJ      Full                                                 +---------+---------------+---------+-----------+----------+-------+ FV Prox  Full                                                 +---------+---------------+---------+-----------+----------+-------+ FV Mid   Full                                                 +---------+---------------+---------+-----------+----------+-------+  FV DistalFull                                                 +---------+---------------+---------+-----------+----------+-------+ PFV      Full                                                 +---------+---------------+---------+-----------+----------+-------+ POP      Full           Yes      Yes                          +---------+---------------+---------+-----------+----------+-------+ PTV      Full                                                 +---------+---------------+---------+-----------+----------+-------+ PERO     Full                                                 +---------+---------------+---------+-----------+----------+-------+  Left Venous Findings: +---------+---------------+---------+-----------+----------+-------+          CompressibilityPhasicitySpontaneityPropertiesSummary +---------+---------------+---------+-----------+----------+-------+ CFV      Full           No       No                            +---------+---------------+---------+-----------+----------+-------+ SFJ      Full           No       No                           +---------+---------------+---------+-----------+----------+-------+ FV Prox  Full                                                 +---------+---------------+---------+-----------+----------+-------+ FV Mid   Full                                                 +---------+---------------+---------+-----------+----------+-------+ FV DistalFull                                                 +---------+---------------+---------+-----------+----------+-------+ PFV      Full           No       No                           +---------+---------------+---------+-----------+----------+-------+ POP  Full           No       No                           +---------+---------------+---------+-----------+----------+-------+ PTV      Full                                                 +---------+---------------+---------+-----------+----------+-------+ PERO     Full                                                 +---------+---------------+---------+-----------+----------+-------+ iliac    None           No       No                   Acute   +---------+---------------+---------+-----------+----------+-------+ Continuous waveforms throughout with no evidence of lower extremity DVT    Summary: Right: There is no evidence of deep vein thrombosis in the lower extremity. Left: There is no evidence of deep vein thrombosis in the lower extremity. Continuous waveforms throughout with no evidence of lower extremity DVT. Thrombus noted in the left iliac but does not extend into the IVC  *See table(s) above for measurements and observations. Electronically signed by Ruta Hinds MD on 05/08/2018 at 5:17:53 PM.    Final     Labs:  CBC: Recent Labs    04/25/18 0041 05/08/18 0110 05/08/18 0543 05/09/18 0247  WBC 5.4 4.5  --  5.3  HGB  10.4* 10.8* 9.5* 9.4*  HCT 33.9* 33.9* 28.0* 29.2*  PLT 387 277  --  243    COAGS: No results for input(s): INR, APTT in the last 8760 hours.  BMP: Recent Labs    04/25/18 0041 05/08/18 0110 05/08/18 0543 05/09/18 0247  NA 139 137 138 137  K 3.0* 3.1* 3.3* 3.3*  CL 98 95*  --  104  CO2 25 25  --  24  GLUCOSE 96 112*  --  103*  BUN <5* <5*  --  <5*  CALCIUM 9.3 9.1  --  8.6*  CREATININE 0.89 0.77  --  0.75  GFRNONAA >60 >60  --  >60  GFRAA >60 >60  --  >60    LIVER FUNCTION TESTS: No results for input(s): BILITOT, AST, ALT, ALKPHOS, PROT, ALBUMIN in the last 8760 hours.  Assessment and Plan:  Discussion with pt this am at bedside 98% RA; In NAD Echo WNL Pt is feeling and breathing easier/better Risk benefits considered for this pt regarding PE Ekos PE Thrombolysis Benefits with this procedure would be minimal at this point. If symptoms would change-- worsen; please contact IR Pt is aware and agreeable   Electronically Signed: Lavonia Drafts, PA-C 05/09/2018, 10:36 AM   I spent a total of 25 Minutes at the the patient's bedside AND on the patient's [facility] floor or unit, greater than 50% of which was counseling/coordinating care for PE Lysis consideration

## 2018-05-10 ENCOUNTER — Inpatient Hospital Stay (HOSPITAL_COMMUNITY): Payer: Medicaid Other

## 2018-05-10 LAB — HEPARIN LEVEL (UNFRACTIONATED): HEPARIN UNFRACTIONATED: 0.5 [IU]/mL (ref 0.30–0.70)

## 2018-05-10 LAB — CBC
HCT: 30.5 % — ABNORMAL LOW (ref 36.0–46.0)
Hemoglobin: 9.5 g/dL — ABNORMAL LOW (ref 12.0–15.0)
MCH: 27.3 pg (ref 26.0–34.0)
MCHC: 31.1 g/dL (ref 30.0–36.0)
MCV: 87.6 fL (ref 80.0–100.0)
Platelets: 278 10*3/uL (ref 150–400)
RBC: 3.48 MIL/uL — ABNORMAL LOW (ref 3.87–5.11)
RDW: 17.6 % — AB (ref 11.5–15.5)
WBC: 6.8 10*3/uL (ref 4.0–10.5)
nRBC: 0 % (ref 0.0–0.2)

## 2018-05-10 LAB — COMPREHENSIVE METABOLIC PANEL
ALT: 16 U/L (ref 0–44)
AST: 38 U/L (ref 15–41)
Albumin: 3.1 g/dL — ABNORMAL LOW (ref 3.5–5.0)
Alkaline Phosphatase: 88 U/L (ref 38–126)
Anion gap: 6 (ref 5–15)
CO2: 27 mmol/L (ref 22–32)
Calcium: 8.9 mg/dL (ref 8.9–10.3)
Chloride: 102 mmol/L (ref 98–111)
Creatinine, Ser: 0.69 mg/dL (ref 0.44–1.00)
GFR calc Af Amer: >60 mL/min (ref >60)
GFR calc non Af Amer: >60 mL/min (ref >60)
Glucose, Bld: 108 mg/dL — ABNORMAL HIGH (ref 70–99)
Potassium: 4.3 mmol/L (ref 3.5–5.1)
SODIUM: 135 mmol/L (ref 135–145)
Total Bilirubin: 0.8 mg/dL (ref 0.3–1.2)
Total Protein: 6.3 g/dL — ABNORMAL LOW (ref 6.5–8.1)

## 2018-05-10 MED ORDER — GADOBUTROL 1 MMOL/ML IV SOLN
6.0000 mL | Freq: Once | INTRAVENOUS | Status: AC | PRN
  Administered 2018-05-10: 6 mL via INTRAVENOUS

## 2018-05-10 NOTE — Plan of Care (Signed)
  Problem: Education: Goal: Knowledge of General Education information will improve Description Including pain rating scale, medication(s)/side effects and non-pharmacologic comfort measures Outcome: Completed/Met   Problem: Clinical Measurements: Goal: Respiratory complications will improve Outcome: Progressing   Problem: Activity: Goal: Risk for activity intolerance will decrease Outcome: Progressing   Problem: Coping: Goal: Level of anxiety will decrease Outcome: Progressing   Problem: Pain Managment: Goal: General experience of comfort will improve Outcome: Progressing

## 2018-05-10 NOTE — Progress Notes (Signed)
PROGRESS NOTE  Holly Graham  SNK:[id number] DOB: [birth year] DOA: 05/08/2018 PCP: Patient, No Pcp Per   Brief Narrative: Holly Graham is a 38 y.o.femalewithout significant personal medical history, +FH DVT, who presented to the ED with progressive dyspnea and left leg swelling found to have PE with cor pulmonale confirmed by CTA chest. Left iliac vein thrombosis was noted. Pelvic U/S demonstrated a large uterine fibroid with MRI recommended.  Assessment & Plan: Principal Problem:   Bilateral pulmonary embolism (HCC) Active Problems:   Hypokalemia  Acute bilateral pulmonary emboli with cor pulmonale suggested on CTA chest:  - Continue IV heparin. IR consulted, felt thrombolytics not indicated based on hemodynamic stability and no echocardiographic evidence of RV failure.  - Plan to transition to [place] once work up of fibroid is complete. - Give +FH clots and young age without precipitant, would benefit from hematology follow up, hypercoagulability work up as outpatient.   Left iliac vein thrombosis: ?due to extrinsic compression from uterine fibroid.  - Anticoagulation as above  Uterine fibroid: No hx menorrhagia, though currently having more prolonged spotting than usual for menstrual cycle. s/p tubal ligation.  - MRI pelvis w/ and w/o today.   Hypokalemia:  - Supplement as needed and monitor  DVT prophylaxis: Heparin IV Code Status: Full Family Communication: Mother at bedside Disposition Plan: Home once work up completed  Consultants:   IR  Procedures:   None  Antimicrobials:  None   Subjective: Overall a bit better but having severe central chest pain with deeper breathing which is stable. Dyspnea on exertion, but didn't require oxygen this morning.   Objective: Vitals:   05/09/18 1235 05/09/18 1700 05/09/18 2101 05/10/18 0757  BP:  120/80 125/85 (!) 134/97  Pulse: 89 71 64 81  Resp: 17 (!) 29 16 18   Temp:  98 F (36.7 C) 98.1 F (36.7 C) 98.1 F (36.7  C)  TempSrc:  Oral Oral Oral  SpO2: 97% 97% 100% 100%  Weight:      Height:        Intake/Output Summary (Last 24 hours) at 05/10/2018 1339 Last data filed at 05/10/2018 0700 Gross per 24 hour  Intake 240 ml  Output -  Net 240 ml   Filed Weights   05/08/18 0059 05/08/18 1109  Weight: 63.5 kg 66.6 kg    Gen: 38 y.o. female in no distress  Pulm: Non-labored but tachypneic on room air. Clear to auscultation bilaterally.  CV: Regular rate and rhythm. No murmur, rub, or gallop. No JVD, no pedal edema. GI: Abdomen soft, non-tender, non-distended, with normoactive bowel sounds. No organomegaly or masses felt. Ext: Warm, no deformities Skin: No rashes, lesions or ulcers Neuro: Alert and oriented. No focal neurological deficits. Psych: Judgement and insight appear normal. Mood & affect appropriate.   Data Reviewed: I have personally reviewed following labs and imaging studies  CBC: Recent Labs  Lab 05/08/18 0110 05/08/18 0543 05/09/18 0247 05/10/18 0319  WBC 4.5  --  5.3 6.8  HGB 10.8* 9.5* 9.4* 9.5*  HCT 33.9* 28.0* 29.2* 30.5*  MCV 87.1  --  85.9 87.6  PLT 277  --  243 937   Basic Metabolic Panel: Recent Labs  Lab 05/08/18 0110 05/08/18 0543 05/09/18 0247 05/10/18 0319  NA 137 138 137 135  K 3.1* 3.3* 3.3* 4.3  CL 95*  --  104 102  CO2 25  --  24 27  GLUCOSE 112*  --  103* 108*  BUN <5*  --  <5* <5*  CREATININE 0.77  --  0.75 0.69  CALCIUM 9.1  --  8.6* 8.9  MG 1.7  --  1.8  --    GFR: Estimated Creatinine Clearance: 87.5 mL/min (by C-G formula based on SCr of 0.69 mg/dL). Liver Function Tests: Recent Labs  Lab 05/10/18 0319  AST 38  ALT 16  ALKPHOS 88  BILITOT 0.8  PROT 6.3*  ALBUMIN 3.1*   No results for input(s): LIPASE, AMYLASE in the last 168 hours. No results for input(s): AMMONIA in the last 168 hours. Coagulation Profile: No results for input(s): INR, PROTIME in the last 168 hours. Cardiac Enzymes: No results for input(s): CKTOTAL, CKMB,  CKMBINDEX, TROPONINI in the last 168 hours. BNP (last 3 results) No results for input(s): PROBNP in the last 8760 hours. HbA1C: No results for input(s): HGBA1C in the last 72 hours. CBG: No results for input(s): GLUCAP in the last 168 hours. Lipid Profile: No results for input(s): CHOL, HDL, LDLCALC, TRIG, CHOLHDL, LDLDIRECT in the last 72 hours. Thyroid Function Tests: No results for input(s): TSH, T4TOTAL, FREET4, T3FREE, THYROIDAB in the last 72 hours. Anemia Panel: No results for input(s): VITAMINB12, FOLATE, FERRITIN, TIBC, IRON, RETICCTPCT in the last 72 hours. Urine analysis: No results found for: COLORURINE, APPEARANCEUR, LABSPEC, PHURINE, GLUCOSEU, HGBUR, BILIRUBINUR, KETONESUR, PROTEINUR, UROBILINOGEN, NITRITE, LEUKOCYTESUR Recent Results (from the past 240 hour(s))  MRSA PCR Screening     Status: None   Collection Time: 05/09/18  9:14 AM  Result Value Ref Range Status   MRSA by PCR NEGATIVE NEGATIVE Final    Comment:        The GeneXpert MRSA Assay (FDA approved for NASAL specimens only), is one component of a comprehensive MRSA colonization surveillance program. It is not intended to diagnose MRSA infection nor to guide or monitor treatment for MRSA infections. Performed at [facility], [place] [address] [place], [place] [address]       Radiology Studies: US Pelvis (transabdominal Only)  Result Date: 05/08/2018 CLINICAL DATA:  Clinical concern for a large fibroid. EXAM: TRANSABDOMINAL ULTRASOUND OF PELVIS TECHNIQUE: Transabdominal ultrasound examination of the pelvis was performed including evaluation of the uterus, ovaries, adnexal regions, and pelvic cul-de-sac. COMPARISON:  None. FINDINGS: Uterus Measurements: 13.4 x 8.5 x 10.1 cm = volume: 602 mL. There is a large intramural myometrial mass within the fundus of the uterus measuring 9.5 by 11.2 by 8.2 cm. Endometrium The endometrium is poorly visualized. Right ovary Measurements: 5.5 x 5.2 x 3.3 cm = volume:  49.3 mL. Several benign-appearing cysts, the largest measuring 2.7 cm. Left ovary Measurements: 3.6 x 4.3 x 2.6 cm = volume: 21 mL. Normal appearance/no adnexal mass. Other findings:  No abnormal free fluid. IMPRESSION: Enlarged uterus containing a large myometrial mass within the fundus which measures 11.2 cm in greatest dimension. If surgical intervention is not planned, further evaluation with contrast-enhanced MRI of the pelvis may be considered. Normal appearance of the ovaries. Electronically Signed   By: Fidela Salisbury M.D.   On: 05/08/2018 22:23   Vas [place] Lower Extremity Venous (dvt)  Result Date: 05/08/2018  Lower Venous Study Risk Factors: Confirmed PE. Comparison Study: No prior study on file for comparison Performing Technologist: Sharion Dove RVS Supporting Technologist: June Leap RDMS, RVT  Examination Guidelines: A complete evaluation includes B-mode imaging, spectral Doppler, color Doppler, and power Doppler as needed of all accessible portions of each vessel. Bilateral testing is considered an integral part of a complete examination. Limited examinations for reoccurring indications may  be performed as noted.  Right Venous Findings: +---------+---------------+---------+-----------+----------+-------+          CompressibilityPhasicitySpontaneityPropertiesSummary +---------+---------------+---------+-----------+----------+-------+ CFV      Full           Yes      Yes                          +---------+---------------+---------+-----------+----------+-------+ SFJ      Full                                                 +---------+---------------+---------+-----------+----------+-------+ FV Prox  Full                                                 +---------+---------------+---------+-----------+----------+-------+ FV Mid   Full                                                 +---------+---------------+---------+-----------+----------+-------+ FV  DistalFull                                                 +---------+---------------+---------+-----------+----------+-------+ PFV      Full                                                 +---------+---------------+---------+-----------+----------+-------+ POP      Full           Yes      Yes                          +---------+---------------+---------+-----------+----------+-------+ PTV      Full                                                 +---------+---------------+---------+-----------+----------+-------+ PERO     Full                                                 +---------+---------------+---------+-----------+----------+-------+  Left Venous Findings: +---------+---------------+---------+-----------+----------+-------+          CompressibilityPhasicitySpontaneityPropertiesSummary +---------+---------------+---------+-----------+----------+-------+ CFV      Full           No       No                           +---------+---------------+---------+-----------+----------+-------+ SFJ      Full           No       No                           +---------+---------------+---------+-----------+----------+-------+  FV Prox  Full                                                 +---------+---------------+---------+-----------+----------+-------+ FV Mid   Full                                                 +---------+---------------+---------+-----------+----------+-------+ FV DistalFull                                                 +---------+---------------+---------+-----------+----------+-------+ PFV      Full           No       No                           +---------+---------------+---------+-----------+----------+-------+ POP      Full           No       No                           +---------+---------------+---------+-----------+----------+-------+ PTV      Full                                                  +---------+---------------+---------+-----------+----------+-------+ PERO     Full                                                 +---------+---------------+---------+-----------+----------+-------+ iliac    None           No       No                   Acute   +---------+---------------+---------+-----------+----------+-------+ Continuous waveforms throughout with no evidence of lower extremity DVT    Summary: Right: There is no evidence of deep vein thrombosis in the lower extremity. Left: There is no evidence of deep vein thrombosis in the lower extremity. Continuous waveforms throughout with no evidence of lower extremity DVT. Thrombus noted in the left iliac but does not extend into the IVC  *See table(s) above for measurements and observations. Electronically signed by Ruta Hinds MD on 05/08/2018 at 5:17:53 PM.    Final     Scheduled Meds: . metoprolol tartrate  12.5 mg Oral BID  . multivitamin with minerals  1 tablet Oral Daily   Continuous Infusions: . heparin 950 Units/hr (05/09/18 2151)     LOS: 2 days   Time spent: 35 minutes.  Patrecia Pour, MD [facility] [email or web address] Password [email or web address] 05/10/2018, 1:39 PM

## 2018-05-10 NOTE — Progress Notes (Signed)
ANTICOAGULATION CONSULT NOTE - Follow Up Consult  Pharmacy Consult for Heparin Indication: pulmonary embolus  No Known Allergies  Patient Measurements: Height: 5\' 3"  (160 cm) Weight: 146 lb 12.8 oz (66.6 kg) IBW/kg (Calculated) : 52.4 Heparin Dosing Weight: 66.6 kg  Vital Signs: Temp: 98.1 F (36.7 C) (02/12 0757) Temp Source: Oral (02/12 0757) BP: 134/97 (02/12 0757) Pulse Rate: 81 (02/12 0757)  Labs: Recent Labs    05/08/18 0110 05/08/18 0543  05/08/18 1933 05/09/18 0247 05/10/18 0319  HGB 10.8* 9.5*  --   --  9.4* 9.5*  HCT 33.9* 28.0*  --   --  29.2* 30.5*  PLT 277  --   --   --  243 278  HEPARINUNFRC  --   --    < > 0.68 0.53 0.50  CREATININE 0.77  --   --   --  0.75 0.69   < > = values in this interval not displayed.    Estimated Creatinine Clearance: 87.5 mL/min (by C-G formula based on SCr of 0.69 mg/dL).  Assessment:  38yo female c/o swelling to LLE since 1400 Sun followed by SOB and chest tightness, CT reveals bilateral PE w/ evidence of right heart strain. No evidence of right heart strain on echo 2/10. 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  Pharmacy consulted for IV heparin dosing.    Heparin level remains therapeutic (0.50) on 950 units/hr. Possible MRI today.  Goal of Therapy:  Heparin level 0.3-0.7 units/ml Monitor platelets by anticoagulation protocol: Yes   Plan:   Continue heparin drip at 950 units/hr  Daily heparin level and CBC.  Follow up oral anticoagulant plans after procedures.  Arty Baumgartner,  Pager: [phone] or phone: [phone] 05/10/2018,9:21 AM

## 2018-05-11 ENCOUNTER — Encounter (HOSPITAL_COMMUNITY): Payer: Self-pay | Admitting: Obstetrics & Gynecology

## 2018-05-11 DIAGNOSIS — I824Y2 Acute embolism and thrombosis of unspecified deep veins of left proximal lower extremity: Secondary | ICD-10-CM

## 2018-05-11 DIAGNOSIS — D219 Benign neoplasm of connective and other soft tissue, unspecified: Secondary | ICD-10-CM

## 2018-05-11 DIAGNOSIS — N939 Abnormal uterine and vaginal bleeding, unspecified: Secondary | ICD-10-CM | POA: Diagnosis not present

## 2018-05-11 HISTORY — DX: Benign neoplasm of connective and other soft tissue, unspecified: D21.9

## 2018-05-11 LAB — BASIC METABOLIC PANEL
ANION GAP: 7 (ref 5–15)
BUN: <5 mg/dL — ABNORMAL LOW (ref 6–20)
CO2: 24 mmol/L (ref 22–32)
Calcium: 8.9 mg/dL (ref 8.9–10.3)
Chloride: 106 mmol/L (ref 98–111)
Creatinine, Ser: 0.76 mg/dL (ref 0.44–1.00)
GFR calc non Af Amer: >60 mL/min (ref >60)
Glucose, Bld: 84 mg/dL (ref 70–99)
Potassium: 4.1 mmol/L (ref 3.5–5.1)
Sodium: 137 mmol/L (ref 135–145)

## 2018-05-11 LAB — CBC
HCT: 30.9 % — ABNORMAL LOW (ref 36.0–46.0)
Hemoglobin: 9.7 g/dL — ABNORMAL LOW (ref 12.0–15.0)
MCH: 27.4 pg (ref 26.0–34.0)
MCHC: 31.4 g/dL (ref 30.0–36.0)
MCV: 87.3 fL (ref 80.0–100.0)
Platelets: 346 10*3/uL (ref 150–400)
RBC: 3.54 MIL/uL — ABNORMAL LOW (ref 3.87–5.11)
RDW: 17.7 % — ABNORMAL HIGH (ref 11.5–15.5)
WBC: 8.2 10*3/uL (ref 4.0–10.5)
nRBC: 0 % (ref 0.0–0.2)

## 2018-05-11 LAB — TSH: TSH: 1.301 u[IU]/mL (ref 0.350–4.500)

## 2018-05-11 LAB — HEPARIN LEVEL (UNFRACTIONATED)
HEPARIN UNFRACTIONATED: 0.37 [IU]/mL (ref 0.30–0.70)
Heparin Unfractionated: 0.29 IU/mL — ABNORMAL LOW (ref 0.30–0.70)

## 2018-05-11 MED ORDER — MEGESTROL ACETATE 40 MG PO TABS
80.0000 mg | ORAL_TABLET | Freq: Two times a day (BID) | ORAL | Status: DC
  Administered 2018-05-11 – 2018-05-18 (×14): 80 mg via ORAL
  Filled 2018-05-11 (×15): qty 2

## 2018-05-11 NOTE — Progress Notes (Signed)
PROGRESS NOTE  Holly Graham  QQV:[id number] DOB: [birth year] DOA: 05/08/2018 PCP: Patient, No Pcp Per   Brief Narrative: Holly Graham is a 38 y.o.femalewithout significant personal medical history, +FH DVT, who presented to the ED with progressive dyspnea and left leg swelling found to have PE with cor pulmonale confirmed by CTA chest. Left iliac vein thrombosis was noted. Pelvic U/S demonstrated a large uterine fibroid with MRI recommended.  Assessment & Plan: Principal Problem:   Bilateral pulmonary embolism (HCC) Active Problems:   Hypokalemia   Abnormal uterine bleeding (AUB)   Fibroids  Acute bilateral pulmonary emboli with cor pulmonale suggested on CTA chest: IR consulted, felt pulmonary thrombolytics not indicated based on hemodynamic stability and no echocardiographic evidence of RV failure.  - Continue IV heparin, slightly subtherapeutic today, will increase rate as this will continue another 24 hours.  - Plan to transition to [place] once work up is complete and no procedures planned. - Give +FH clots and young age without precipitant, may benefit from hematology follow up, hypercoagulability work up as outpatient.   Left iliac vein thrombosis: ?due to extrinsic compression. Uterine fibroid does not appear to be source of compression on MRI, but possible May-Thurner syndrome.  - Discussed with Dr. Carlis Abbott, vascular surgery. He has shared options with the patient which include anticoagulation alone, catheter-directed lytic Tx, or thrombectomy. She will discuss with family and he will follow up 2/14.  - Anticoagulation as above  Uterine fibroids, vaginal bleeding: Very large. Pt reports menorrhagia over past 6-8 months and currently vaginal bleeding worse and more prolonged than usual in setting of anticoagulation.  - Hgb fortunately stable, will recheck in AM - OB/GYN consulted for recommendations. Will likely stop bleeding with megace and follow up as outpatient. Dr. Harolyn Rutherford to  see.  Hypokalemia:  - Supplement as needed and monitor  DVT prophylaxis: Heparin IV Code Status: Full Family Communication: At bedside Disposition Plan: Home once work up completed, depending on decision regarding vascular surgery and if hgb remains stable, may transition to po DOAC and DC 2/14.   Consultants:   IR  Vascular surgery  OB/GYN  Procedures:   None  Antimicrobials:  None   Subjective: Leg swelling improved. Still with moderate midchest pain with heavy breathing, very short of breath when getting up to bathroom. Having ongoing vaginal bleeding today which is abnormal for her.   Objective: Vitals:   05/10/18 0757 05/10/18 1633 05/10/18 2324 05/11/18 0802  BP: (!) 134/97 120/85 (!) 134/92 121/81  Pulse: 81 79 82 74  Resp: 18 18 20 18   Temp: 98.1 F (36.7 C) 98 F (36.7 C) 98.1 F (36.7 C) 98 F (36.7 C)  TempSrc: Oral Oral Oral   SpO2: 100% 100% 98% 100%  Weight:      Height:        Intake/Output Summary (Last 24 hours) at 05/11/2018 1326 Last data filed at 05/10/2018 1700 Gross per 24 hour  Intake 480 ml  Output -  Net 480 ml   Filed Weights   05/08/18 0059 05/08/18 1109  Weight: 63.5 kg 66.6 kg   Gen: 38 y.o. female in no distress Pulm: Nonlabored, tachypneic on room air. Clear. CV: Regular rate and rhythm. No murmur, rub, or gallop. No JVD, no dependent edema. GI: Abdomen soft, non-tender, non-distended, with normoactive bowel sounds.  Ext: Warm, no deformities. LLE swelling improved.  Skin: No rashes, lesions or ulcers on visualized skin. Neuro: Alert and oriented. No focal neurological deficits. Psych: Judgement and insight appear  fair. Mood euthymic & affect congruent. Behavior is appropriate.    Data Reviewed: I have personally reviewed following labs and imaging studies  CBC: Recent Labs  Lab 05/08/18 0110 05/08/18 0543 05/09/18 0247 05/10/18 0319 05/11/18 0244  WBC 4.5  --  5.3 6.8 8.2  HGB 10.8* 9.5* 9.4* 9.5* 9.7*  HCT  33.9* 28.0* 29.2* 30.5* 30.9*  MCV 87.1  --  85.9 87.6 87.3  PLT 277  --  243 278 818   Basic Metabolic Panel: Recent Labs  Lab 05/08/18 0110 05/08/18 0543 05/09/18 0247 05/10/18 0319 05/11/18 0244  NA 137 138 137 135 137  K 3.1* 3.3* 3.3* 4.3 4.1  CL 95*  --  104 102 106  CO2 25  --  24 27 24   GLUCOSE 112*  --  103* 108* 84  BUN <5*  --  <5* <5* <5*  CREATININE 0.77  --  0.75 0.69 0.76  CALCIUM 9.1  --  8.6* 8.9 8.9  MG 1.7  --  1.8  --   --    GFR: Estimated Creatinine Clearance: 87.5 mL/min (by C-G formula based on SCr of 0.76 mg/dL). Liver Function Tests: Recent Labs  Lab 05/10/18 0319  AST 38  ALT 16  ALKPHOS 88  BILITOT 0.8  PROT 6.3*  ALBUMIN 3.1*   No results for input(s): LIPASE, AMYLASE in the last 168 hours. No results for input(s): AMMONIA in the last 168 hours. Coagulation Profile: No results for input(s): INR, PROTIME in the last 168 hours. Cardiac Enzymes: No results for input(s): CKTOTAL, CKMB, CKMBINDEX, TROPONINI in the last 168 hours. BNP (last 3 results) No results for input(s): PROBNP in the last 8760 hours. HbA1C: No results for input(s): HGBA1C in the last 72 hours. CBG: No results for input(s): GLUCAP in the last 168 hours. Lipid Profile: No results for input(s): CHOL, HDL, LDLCALC, TRIG, CHOLHDL, LDLDIRECT in the last 72 hours. Thyroid Function Tests: Recent Labs    05/11/18 0244  TSH 1.301   Anemia Panel: No results for input(s): VITAMINB12, FOLATE, FERRITIN, TIBC, IRON, RETICCTPCT in the last 72 hours. Urine analysis: No results found for: COLORURINE, APPEARANCEUR, LABSPEC, PHURINE, GLUCOSEU, HGBUR, BILIRUBINUR, KETONESUR, PROTEINUR, UROBILINOGEN, NITRITE, LEUKOCYTESUR Recent Results (from the past 240 hour(s))  MRSA PCR Screening     Status: None   Collection Time: 05/09/18  9:14 AM  Result Value Ref Range Status   MRSA by PCR NEGATIVE NEGATIVE Final    Comment:        The GeneXpert MRSA Assay (FDA approved for NASAL  specimens only), is one component of a comprehensive MRSA colonization surveillance program. It is not intended to diagnose MRSA infection nor to guide or monitor treatment for MRSA infections. Performed at [facility], [place] [address] [place], [place] [address]       Radiology Studies: Mr Pelvis W Wo Contrast  Result Date: 05/10/2018 CLINICAL DATA:  38 year old female inpatient admitted with pulmonary embolism and enlarged myomatous uterus. History of tubal ligation. EXAM: MRI PELVIS WITHOUT AND WITH CONTRAST TECHNIQUE: Multiplanar multisequence MR imaging of the pelvis was performed both before and after administration of intravenous contrast. CONTRAST:  6 cc Gadavist IV COMPARISON:  05/08/2018 pelvic sonogram. FINDINGS: Urinary Tract:  Normal nondistended bladder.  Normal urethra. Bowel: Visualized small and large bowel are normal caliber with no bowel wall thickening. Vascular/Lymphatic: No pathologically enlarged lymph nodes in the pelvis. Small nonocclusive thrombus in the left common femoral vein (series 10/image 24). Nonocclusive tubular acute thrombi  throughout the left common and external iliac veins. Apparent focal narrowing of the left common iliac vein (series 10/image 4), although this does not correlate with extrinsic compression from the enlarged myomatous uterus on these images. Asymmetric edema surrounding the length of the left common and external iliac veins. No evidence of right pelvic deep venous thrombosis. Reproductive: Uterus: The enlarged myomatous uterus measures 16.0 x 10.2 x 11.5 cm (volume = 983 cm^3). There are multiple enhancing uterine fibroids (greater than 10), with representative fibroids as follows: -right fundal intramural 6.7 x 4.8 x 4.9 cm (volume = 83 cm^3) fibroid -left uterine body submucosal 1.8 x 1.3 x 1.2 cm (volume = 1.5 cm^3) fibroid -posterior left upper uterine body 4.7 x 3.8 x 3.3 cm (volume = 31 cm^3) fibroid with approximately 40% submucosal  component A few of the fibroids demonstrate heterogeneous T2 hyperintensity indicative of partial cystic degeneration. No intracavitary or pedunculated fibroids are evident. Inner myometrium (junctional zone) is poorly delineated and measures approximately 7 mm in thickness, which is within normal limits. Endometrium is distorted by surrounding fibroids and measures approximately 6 mm in bilayer thickness, which is within normal limits. No endometrial cavity fluid or focal endometrial mass. Scattered small nabothian cysts in the cervix. Fibrous cervical stroma is intact. Ovaries and Adnexa: The right ovary measures 5.8 x 3.5 x 5.2 cm and is normal with several simple follicular cysts measuring up to the 3.2 cm. The left ovary measures 4.5 x 3.0 x 3.1 cm and is normal with a small hemorrhagic corpus luteum (series 4/image 10). There are no suspicious ovarian or adnexal masses. Other: No abnormal free fluid in the pelvis. No focal pelvic fluid collection. Musculoskeletal: No aggressive appearing focal osseous lesions. IMPRESSION: 1. Acute nonocclusive deep venous thrombosis in the left pelvis involving the left common and external iliac and left common femoral veins. Apparent focal narrowing of the left common iliac vein, which does not appear to correlate with extrinsic compression from the enlarged myomatous uterus. 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 2. Enlarged myomatous uterus as detailed. Please note that MRI can not reliably distinguish benign uterine fibroids from leiomyosarcoma. Although there are no overtly aggressive features associated with these uterine fibroids on this MRI study, clinical follow-up is necessary. 3. No ovarian or adnexal abnormality. Electronically Signed   By: Ilona Sorrel M.D.   On: 05/10/2018 20:57    Scheduled Meds: . megestrol  80 mg Oral BID  . metoprolol tartrate  12.5 mg Oral BID  . multivitamin with minerals  1 tablet Oral Daily   Continuous Infusions: . heparin 1,050  Units/hr (05/11/18 0955)     LOS: 3 days   Time spent: 35 minutes.  Patrecia Pour, MD [facility] [email or web address] Password [email or web address] 05/11/2018, 1:26 PM

## 2018-05-11 NOTE — Consult Note (Signed)
Hospital Consult    Reason for Consult: Left common/external iliac vein DVT Referring Physician: Hospitalist MRN #:  [id number]  History of Present Illness: This is a 38 y.o. female with no significant medical history that was admitted 3 days ago with bilateral PEs.  Ultimately she was found to have a large uterine mass near 11 cm and underwent MRI of her pelvis and this noted a left common and external iliac vein thrombus.  Patient states she initially came to the ED because she was feeling short of breath and had significant swelling in her left leg on Monday.  She denies any previous history of blood clots.  She does have a family history of a sister and an uncle with blood clot.  Patient states she is functional.  She works as a Education officer, museum.  On evaluation today she states that her left leg swelling has improved some and is not having any significant pain in the leg at this time.  She is on a heparin drip at this time and has been having very heavy bleeding during her periods that has been getting worse over the last few months and this has been exaggerated with the heparin started in the hospital.  Denies tobacco abuse.  Denies any known history of malignancy.  History reviewed. No pertinent past medical history.  History reviewed. No pertinent surgical history.  No Known Allergies  Prior to Admission medications   Not on File    Social History   Socioeconomic History  . Marital status: Single    Spouse name: Not on file  . Number of children: Not on file  . Years of education: Not on file  . Highest education level: Not on file  Occupational History  . Not on file  Social Needs  . Financial resource strain: Not on file  . Food insecurity:    Worry: Not on file    Inability: Not on file  . Transportation needs:    Medical: Not on file    Non-medical: Not on file  Tobacco Use  . Smoking status: Never Smoker  . Smokeless tobacco: Never Used  Substance and Sexual  Activity  . Alcohol use: Yes  . Drug use: Never  . Sexual activity: Yes  Lifestyle  . Physical activity:    Days per week: Not on file    Minutes per session: Not on file  . Stress: Not on file  Relationships  . Social connections:    Talks on phone: Not on file    Gets together: Not on file    Attends religious service: Not on file    Active member of club or organization: Not on file    Attends meetings of clubs or organizations: Not on file    Relationship status: Not on file  . Intimate partner violence:    Fear of current or ex partner: Not on file    Emotionally abused: Not on file    Physically abused: Not on file    Forced sexual activity: Not on file  Other Topics Concern  . Not on file  Social History Narrative  . Not on file     Family History  Problem Relation Age of Onset  . Pulmonary embolism Brother     ROS: [x]  Positive   [ ]  Negative   [ ]  All sytems reviewed and are negative  Cardiovascular: []  chest pain/pressure []  palpitations []  SOB lying flat [x]  DOE []  pain in legs while walking []  pain  in legs at rest []  pain in legs at night []  non-healing ulcers []  hx of DVT []  swelling in legs  Pulmonary: []  productive cough []  asthma/wheezing []  home O2  Neurologic: []  weakness in []  arms []  legs []  numbness in []  arms []  legs []  hx of CVA []  mini stroke [] difficulty speaking or slurred speech []  temporary loss of vision in one eye []  dizziness  Hematologic: []  hx of cancer []  bleeding problems []  problems with blood clotting easily  Endocrine:   []  diabetes []  thyroid disease  GI []  vomiting blood []  blood in stool  GU: []  CKD/renal failure []  HD--[]  M/W/F or []  T/T/S []  burning with urination []  blood in urine  Psychiatric: []  anxiety []  depression  Musculoskeletal: []  arthritis []  joint pain  Integumentary: []  rashes []  ulcers  Constitutional: []  fever []  chills   Physical Examination  Vitals:   05/10/18  2324 05/11/18 0802  BP: (!) 134/92 121/81  Pulse: 82 74  Resp: 20 18  Temp: 98.1 F (36.7 C) 98 F (36.7 C)  SpO2: 98% 100%   Body mass index is 26 kg/m.  General:  WDWN in NAD Gait: Not observed HENT: WNL, normocephalic Pulmonary: normal non-labored breathing, without Rales, rhonchi,  wheezing Cardiac: regular, without  Murmurs, rubs or gallops Abdomen: soft, NT/ND, no masses Skin: without rashes Vascular Exam/Pulses:  Right Left  Radial 2+ (normal) 2+ (normal)  Ulnar    Femoral 2+ (normal) 2+ (normal)  Popliteal    DP 2+ (normal) 2+ (normal)  PT     Extremities: without ischemic changes, without Gangrene , without cellulitis; without open wounds;  Musculoskeletal: no muscle wasting or atrophy  Neurologic: A&O X 3; Appropriate Affect ; SENSATION: normal; MOTOR FUNCTION:  moving all extremities equally. Speech is fluent/normal   CBC    Component Value Date/Time   WBC 8.2 05/11/2018 0244   RBC 3.54 (L) 05/11/2018 0244   HGB 9.7 (L) 05/11/2018 0244   HCT 30.9 (L) 05/11/2018 0244   PLT 346 05/11/2018 0244   MCV 87.3 05/11/2018 0244   MCH 27.4 05/11/2018 0244   MCHC 31.4 05/11/2018 0244   RDW 17.7 (H) 05/11/2018 0244    BMET    Component Value Date/Time   NA 137 05/11/2018 0244   K 4.1 05/11/2018 0244   CL 106 05/11/2018 0244   CO2 24 05/11/2018 0244   GLUCOSE 84 05/11/2018 0244   BUN <5 (L) 05/11/2018 0244   CREATININE 0.76 05/11/2018 0244   CALCIUM 8.9 05/11/2018 0244   GFRNONAA >60 05/11/2018 0244   GFRAA >60 05/11/2018 0244    COAGS: No results found for: INR, PROTIME   Non-Invasive Vascular Imaging:    Lower extremity venous duplex 05/08/18 shows patent common femoral, profunda, superficial femoral, popliteal and tibial veins bilaterally with no thrombus - thrombus was noted in left iliac.  MRI pelvis shows a left common and external iliac partially occlusive thrombus.    ASSESSMENT/PLAN: This is a 38 y.o. female admitted with bilateral  segmental PEs that vascular surgery has been consulted for findings of a left common and external iliac vein thrombus on MRI to evaluate a pelvic mass.  In evaluating the patient left leg her swelling has actually improved significantly on heparin and she is not have any significant symptomats at this time.  Discussed with her that her MRI did show thrombus in the left common and external iliac vein in the setting of suspected large uterine fibroids with increasing severity of heavy  menstrual periods over the last few months.  Could be consistent with extrinsic compression from the uterus and May-Thurner.  Would appreciate OB/GYN's input as to if there is any other role for intervention regarding this uterine mass.  I discussed with her that the options from our standpoint would be anticoagulation alone with no further surgical intervention versus lytic catheter placement with thrombolysis versus percutanenous venous thrombectomy (Inari ClotTriever).  I will follow-up with her again tomorrow to further discuss options with family after she has been evaluated by OB/GYN.  I do not think lysis is in her best interest given her heavy menstrual bleeding on heparin and I think if we do intervene the Inari ClotTreiver/IVUS would probably be the best - potentially Monday.     Marty Heck, MD [facility] Office: [phone] Pager: [phone]

## 2018-05-11 NOTE — Progress Notes (Signed)
ANTICOAGULATION CONSULT NOTE - Follow Up Consult  Pharmacy Consult for Heparin Indication: pulmonary embolus  No Known Allergies  Patient Measurements: Height: 5\' 3"  (160 cm) Weight: 146 lb 12.8 oz (66.6 kg) IBW/kg (Calculated) : 52.4 Heparin Dosing Weight: 66.6 kg  Vital Signs: Temp: 98 F (36.7 C) (02/13 0802) BP: 121/81 (02/13 0802) Pulse Rate: 74 (02/13 0802)  Labs: Recent Labs    05/09/18 0247 05/10/18 0319 05/11/18 0244 05/11/18 1542  HGB 9.4* 9.5* 9.7*  --   HCT 29.2* 30.5* 30.9*  --   PLT 243 278 346  --   HEPARINUNFRC 0.53 0.50 0.29* 0.37  CREATININE 0.75 0.69 0.76  --     Estimated Creatinine Clearance: 87.5 mL/min (by C-G formula based on SCr of 0.76 mg/dL).  Assessment: 38yo female c/o swelling to LLE since 1400 Sun followed by SOB and chest tightness, CT reveals bilateral PE w/ evidence of right heart strain. No evidence of right heart strain on echo 2/10. 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  Pharmacy consulted for IV heparin dosing.  Heparin level is therapeutic this PM.  Noted documentation of abnormal uterine bleeding and OBGYN started Megace.    Goal of Therapy:  Heparin level 0.3-0.7 units/ml Monitor platelets by anticoagulation protocol: Yes   Plan:  Continue heparin gtt at 1050 units/hr F/U AM labs, resolution of bleeding  Faiza Bansal D. Mina Marble, PharmD, BCPS, [place] 05/11/2018, 4:24 PM

## 2018-05-11 NOTE — Progress Notes (Signed)
ANTICOAGULATION CONSULT NOTE - Follow Up Consult  Pharmacy Consult for Heparin Indication: pulmonary embolus  No Known Allergies  Patient Measurements: Height: 5\' 3"  (160 cm) Weight: 146 lb 12.8 oz (66.6 kg) IBW/kg (Calculated) : 52.4 Heparin Dosing Weight: 66.6 kg  Vital Signs: Temp: 98 F (36.7 C) (02/13 0802) Temp Source: Oral (02/12 2324) BP: 121/81 (02/13 0802) Pulse Rate: 74 (02/13 0802)  Labs: Recent Labs    05/09/18 0247 05/10/18 0319 05/11/18 0244  HGB 9.4* 9.5* 9.7*  HCT 29.2* 30.5* 30.9*  PLT 243 278 346  HEPARINUNFRC 0.53 0.50 0.29*  CREATININE 0.75 0.69 0.76    Estimated Creatinine Clearance: 87.5 mL/min (by C-G formula based on SCr of 0.76 mg/dL).  Assessment:  38yo female c/o swelling to LLE since 1400 Sun followed by SOB and chest tightness, CT reveals bilateral PE w/ evidence of right heart strain. No evidence of right heart strain on echo 2/10. 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  Pharmacy consulted for IV heparin dosing.    Heparin level just below therapeutic range (0.29) on 950 units/hr. Possible brief interruption during MRI on 2/12 ~6pm, but otherwise no known infusion problems. CBC stable. Discussed with Dr. Bonner Puna.  OB/Gyn consulted for uterine fibroids.  Goal of Therapy:  Heparin level 0.3-0.7 units/ml Monitor platelets by anticoagulation protocol: Yes   Plan:   Increase heparin drip at 1050 units/hr  Heparin level ~6 hrs after rate change.  Daily heparin level and CBC.  Follow up oral anticoagulant plans when able.  Arty Baumgartner, [place] Pager: [phone] or phone: [phone] 05/11/2018,9:32 AM

## 2018-05-11 NOTE — Consult Note (Signed)
[facility] Attending Consult Note   Consult Date: 05/11/2018  Reason for Consult: Abnormal uterine bleeding (AUB), uterine fibroids, anticoagulated state due to recent diagnosis of pulmonary embolism Consulting Physician: Dr. Vance Gather    Assessment/Plan: Patient has some mild-moderate abnormal uterine bleeding due to her anticoagulated state, but this is not affecting her hemoglobin or resulting in anemia at this point.  Will start on Megace 80 mg po bid, this will help to slow down the bleeding (may or may not stop it); patient advised to continue on the medication for now.   She does have  fibroids but no menorrhagia prior to this admission; no need for acute intervention for the fibroids at this point.  Furthermore, she will need anticoagulation for several months and is not a good surgical candidate.  Medical management of the abnormal uterine bleeding (AUB) is preferred at this point.  From our standpoint, there is no contraindication to any therapy recommended by Interventional Radiology for her left iliac thrombosis.     Patient can follow up with our office as an outpatient for further management.  An appointment has been made for patient on June 01, 2018 at 1:55 pm at [facility] [place] at [facility].  We will sign off but please call [phone] [facility] Attending on call) for any other gynecologic concerns at any time.  Thank you for involving [place] in the care of this patient.    Verita Schneiders, MD, [place] for [facility], [place] Phone: [phone]     History of Present Illness: Holly Graham is an 38 y.o. G3P3 female who was admitted on 05/08/2018 after recent diagnosis of pulmonary embolus. 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  No history of menorrhagia, but after the patient started on anticoagulation with a heparin drip, she had some prolonged menstrual bleeding. No other known inciting factors for thromboembolism; but she does have a sister and uncle with history of DVT/PE. Her hemoglobin has remained stable between 9.5-9.7, no signs/symptoms of anemia. Our service was consulted for management of the anticoagulation induced prolonged bleeding. On encounter with the patient, she reported that her LMP started 05/04/18 and the bleeding is not heavy but prolonged. Reports wearing about 3 pads per day. No symptoms of anemia.  She does confirm that she had always had normal periods, no other gynecologic concerns. History of three term vaginal deliveries followed by tubal sterilization.   Denies any other gynecologic symptoms.   Pertinent OB/GYN History: Patient's last menstrual period was 05/04/2018. As mentioned above.  No cervical dysplasia, no STIs. Last pap was over 4 years ago.  Patient Active Problem List   Diagnosis Date Noted  . Abnormal uterine bleeding (AUB) 05/11/2018  . Fibroids 05/11/2018  . Bilateral pulmonary embolism ([place]) 05/08/2018  . Hypokalemia 05/08/2018    Past Medical History:  Diagnosis Date  . Fibroids 05/11/2018    Past Surgical History:  Procedure Laterality Date  . TUBAL LIGATION      Family History  Problem Relation Age of Onset  . Pulmonary embolism Brother     Social History:  reports that she has never smoked. She has never used smokeless tobacco. She reports current alcohol use. She reports that she does  not use drugs.  Allergies: No Known Allergies  Medications:  I have reviewed the patient's current medications. Prior to Admission:  No medications prior to admission.   Scheduled: . megestrol  80 mg Oral BID  . metoprolol tartrate  12.5 mg Oral BID  . multivitamin with minerals  1 tablet Oral Daily   Continuous: . heparin  1,050 Units/hr (05/11/18 0955)   IAX:KPVVZSMOLMBEM, hydrALAZINE, ondansetron **OR** ondansetron (ZOFRAN) IV, oxyCODONE, traMADol Anti-infectives (From admission, onward)   None      Review of Systems: Pertinent items noted in HPI and remainder of comprehensive ROS otherwise negative.  Focused Physical Examination BP 121/81   Pulse 74   Temp 98 F (36.7 C)   Resp 18   Ht 5\' 3"  (1.6 m)   Wt 66.6 kg   LMP 05/04/2018   SpO2 100%   BMI 26.00 kg/m  CONSTITUTIONAL: Well-developed, well-nourished female in no acute distress.  HENT:  Normocephalic, atraumatic, External right and left ear normal. Oropharynx is clear and moist EYES: Conjunctivae and EOM are normal. Pupils are equal, round, and reactive to light. No scleral icterus.  NECK: Normal range of motion, supple, no masses.  Normal thyroid.  SKIN: Skin is warm and dry. No rash noted. Not diaphoretic. No erythema. No pallor. [place]: Alert and oriented to person, place, and time. Normal reflexes, muscle tone coordination. No cranial nerve deficit noted. PSYCHIATRIC: Normal mood and affect. Normal behavior. Normal judgment and thought content. CARDIOVASCULAR: Normal heart rate noted, regular rhythm RESPIRATORY: Clear to auscultation bilaterally. Effort and breath sounds normal, no problems with respiration noted. ABDOMEN: Soft, normal bowel sounds, enlarged 20 week size globular fibroid uterus noted.   No tenderness, rebound or guarding.  PELVIC: Small amount of old blood on pad. MUSCULOSKELETAL: Normal range of motion. No tenderness.  No cyanosis, clubbing, or edema.  2+ distal pulses.  Labs:  CBC Latest Ref Rng & Units 05/11/2018 05/10/2018 05/09/2018  WBC 4.0 - 10.5 K/uL 8.2 6.8 5.3  Hemoglobin 12.0 - 15.0 g/dL 9.7(L) 9.5(L) 9.4(L)  Hematocrit 36.0 - 46.0 % 30.9(L) 30.5(L) 29.2(L)  Platelets 150 - 400 K/uL 346 278 243   Results for orders placed or performed during the hospital encounter of 05/08/18 (from the past 72 hour(s))    Heparin level (unfractionated)     Status: None   Collection Time: 05/08/18  7:33 PM  Result Value Ref Range   Heparin Unfractionated 0.68 0.30 - 0.70 IU/mL    Comment: (NOTE) If heparin results are below expected values, and patient dosage has  been confirmed, suggest follow up testing of antithrombin III levels. Performed at [facility], [place] [address], [place], [place] [address]   Heparin level (unfractionated)     Status: None   Collection Time: 05/09/18  2:47 AM  Result Value Ref Range   Heparin Unfractionated 0.53 0.30 - 0.70 IU/mL    Comment: (NOTE) If heparin results are below expected values, and patient dosage has  been confirmed, suggest follow up testing of antithrombin III levels. Performed at [facility], [place] [address], [place] [address]   CBC     Status: Abnormal   Collection Time: 05/09/18  2:47 AM  Result Value Ref Range   WBC 5.3 4.0 - 10.5 K/uL   RBC 3.40 (L) 3.87 - 5.11 MIL/uL   Hemoglobin 9.4 (L) 12.0 - 15.0 g/dL   HCT 29.2 (L) 36.0 - 46.0 %   MCV 85.9 80.0 - 100.0 fL  MCH 27.6 26.0 - 34.0 pg   MCHC 32.2 30.0 - 36.0 g/dL   RDW 17.9 (H) 11.5 - 15.5 %   Platelets 243 150 - 400 K/uL   nRBC 0.0 0.0 - 0.2 %    Comment: Performed at [facility] [address] [place], [place] [address]  Basic metabolic panel     Status: Abnormal   Collection Time: 05/09/18  2:47 AM  Result Value Ref Range   Sodium 137 135 - 145 mmol/L   Potassium 3.3 (L) 3.5 - 5.1 mmol/L   Chloride 104 98 - 111 mmol/L   CO2 24 22 - 32 mmol/L   Glucose, Bld 103 (H) 70 - 99 mg/dL   BUN <5 (L) 6 - 20 mg/dL   Creatinine, Ser 0.75 0.44 - 1.00 mg/dL   Calcium 8.6 (L) 8.9 - 10.3 mg/dL   GFR calc non Af Amer >60 >60 mL/min   GFR calc Af Amer >60 >60 mL/min   Anion gap 9 5 - 15    Comment: Performed at [facility] [address] [place], [place] [address]  Magnesium     Status: None   Collection Time: 05/09/18  2:47 AM  Result Value Ref Range    Magnesium 1.8 1.7 - 2.4 mg/dL    Comment: Performed at [facility] [address] [place], [place] [address]  MRSA PCR Screening     Status: None   Collection Time: 05/09/18  9:14 AM  Result Value Ref Range   MRSA by PCR NEGATIVE NEGATIVE    Comment:        The GeneXpert MRSA Assay (FDA approved for NASAL specimens only), is one component of a comprehensive MRSA colonization surveillance program. It is not intended to diagnose MRSA infection nor to guide or monitor treatment for MRSA infections. Performed at [facility], [place] [address] [place], [place] [address]   Heparin level (unfractionated)     Status: None   Collection Time: 05/10/18  3:19 AM  Result Value Ref Range   Heparin Unfractionated 0.50 0.30 - 0.70 IU/mL    Comment: (NOTE) If heparin results are below expected values, and patient dosage has  been confirmed, suggest follow up testing of antithrombin III levels. Performed at [facility], [place] [address] [place], [place] [address]   CBC     Status: Abnormal   Collection Time: 05/10/18  3:19 AM  Result Value Ref Range   WBC 6.8 4.0 - 10.5 K/uL   RBC 3.48 (L) 3.87 - 5.11 MIL/uL   Hemoglobin 9.5 (L) 12.0 - 15.0 g/dL   HCT 30.5 (L) 36.0 - 46.0 %   MCV 87.6 80.0 - 100.0 fL   MCH 27.3 26.0 - 34.0 pg   MCHC 31.1 30.0 - 36.0 g/dL   RDW 17.6 (H) 11.5 - 15.5 %   Platelets 278 150 - 400 K/uL   nRBC 0.0 0.0 - 0.2 %    Comment: Performed at [facility], [place] [address], [place], [place] [address]  Comprehensive metabolic panel     Status: Abnormal   Collection Time: 05/10/18  3:19 AM  Result Value Ref Range   Sodium 135 135 - 145 mmol/L   Potassium 4.3 3.5 - 5.1 mmol/L    Comment: DELTA CHECK NOTED   Chloride 102 98 - 111 mmol/L   CO2 27 22 - 32 mmol/L   Glucose, Bld 108 (H) 70 - 99 mg/dL  BUN <5 (L) 6 - 20 mg/dL   Creatinine, Ser 0.69 0.44 - 1.00 mg/dL   Calcium 8.9 8.9 - 10.3 mg/dL   Total Protein 6.3 (L) 6.5 - 8.1 g/dL   Albumin  3.1 (L) 3.5 - 5.0 g/dL   AST 38 15 - 41 U/L   ALT 16 0 - 44 U/L   Alkaline Phosphatase 88 38 - 126 U/L   Total Bilirubin 0.8 0.3 - 1.2 mg/dL   GFR calc non Af Amer >60 >60 mL/min   GFR calc Af Amer >60 >60 mL/min   Anion gap 6 5 - 15    Comment: Performed at [facility], [place] [address] [place], [place] [address]  Heparin level (unfractionated)     Status: Abnormal   Collection Time: 05/11/18  2:44 AM  Result Value Ref Range   Heparin Unfractionated 0.29 (L) 0.30 - 0.70 IU/mL    Comment: (NOTE) If heparin results are below expected values, and patient dosage has  been confirmed, suggest follow up testing of antithrombin III levels. Performed at [facility], [place] [address], [place], [place] [address]   CBC     Status: Abnormal   Collection Time: 05/11/18  2:44 AM  Result Value Ref Range   WBC 8.2 4.0 - 10.5 K/uL   RBC 3.54 (L) 3.87 - 5.11 MIL/uL   Hemoglobin 9.7 (L) 12.0 - 15.0 g/dL   HCT 30.9 (L) 36.0 - 46.0 %   MCV 87.3 80.0 - 100.0 fL   MCH 27.4 26.0 - 34.0 pg   MCHC 31.4 30.0 - 36.0 g/dL   RDW 17.7 (H) 11.5 - 15.5 %   Platelets 346 150 - 400 K/uL   nRBC 0.0 0.0 - 0.2 %    Comment: Performed at [facility], [place] [address], [place], [place] [address]  Basic metabolic panel     Status: Abnormal   Collection Time: 05/11/18  2:44 AM  Result Value Ref Range   Sodium 137 135 - 145 mmol/L   Potassium 4.1 3.5 - 5.1 mmol/L   Chloride 106 98 - 111 mmol/L   CO2 24 22 - 32 mmol/L   Glucose, Bld 84 70 - 99 mg/dL   BUN <5 (L) 6 - 20 mg/dL   Creatinine, Ser 0.76 0.44 - 1.00 mg/dL   Calcium 8.9 8.9 - 10.3 mg/dL   GFR calc non Af Amer >60 >60 mL/min   GFR calc Af Amer >60 >60 mL/min   Anion gap 7 5 - 15    Comment: Performed at [facility], [place] [address] [place], [place] [address]  TSH     Status: None   Collection Time: 05/11/18  2:44 AM  Result Value Ref Range   TSH 1.301 0.350 - 4.500 uIU/mL    Comment: Performed by a 3rd Generation assay with a  functional sensitivity of <=0.01 uIU/mL. Performed at [facility], [place] [address] [place], [place] [address]    Imaging: Dg Chest 2 View  Result Date: 05/08/2018 CLINICAL DATA:  Chest pain, shortness of Breath EXAM: CHEST - 2 VIEW COMPARISON:  04/25/2018 FINDINGS: Heart and mediastinal contours are within normal limits. No focal opacities or effusions. No acute bony abnormality. IMPRESSION: No active cardiopulmonary disease. Electronically Signed   By: Rolm Baptise M.D.   On: 05/08/2018 01:24   Dg Chest 2 View  Result Date: 04/25/2018 CLINICAL DATA:  Nausea and fatigue for 8 months. EXAM: CHEST - 2  VIEW COMPARISON:  None. FINDINGS: The heart size and mediastinal contours are within normal limits. Both lungs are clear. The visualized skeletal structures are unremarkable. IMPRESSION: No active cardiopulmonary disease. Electronically Signed   By: Ashley Royalty M.D.   On: 04/25/2018 01:02   Ct Angio Chest Pe W And/or Wo Contrast  Result Date: 05/08/2018 CLINICAL DATA:  38 y/o F; swelling of left leg with no known injury. Shortness of breath and chest tightness. EXAM: CT ANGIOGRAPHY CHEST WITH CONTRAST TECHNIQUE: Multidetector CT imaging of the chest was performed using the standard protocol during bolus administration of intravenous contrast. Multiplanar CT image reconstructions and MIPs were obtained to evaluate the vascular anatomy. CONTRAST:  55 cc Isovue 370 COMPARISON:  05/08/2018 chest radiograph. FINDINGS: Cardiovascular: Acute right lobar and bilateral segmental pulmonary emboli. RV/LV = 1.5. Normal caliber thoracic aorta. Mediastinum/Nodes: No enlarged mediastinal, hilar, or axillary lymph nodes. Thyroid gland, trachea, and esophagus demonstrate no significant findings. Lungs/Pleura: Lungs are clear. No pleural effusion or pneumothorax. Upper Abdomen: No acute abnormality. Musculoskeletal: No chest wall abnormality. No acute or significant osseous findings. Review of the MIP images confirms  the above findings. IMPRESSION: Positive for acute PE with CTevidence of right heart strain (RV/LV Ratio = 1.5) consistent with at least submassive (intermediate risk) PE. The presence of right heart strain has been associated with an increased risk of morbidity and mortality. Critical Value/emergent results were called by telephone at the time of interpretation on 05/08/2018 at 4:29 am to Dr. Pryor Curia , who verbally acknowledged these results. Electronically Signed   By: Kristine Garbe M.D.   On: 05/08/2018 04:30   Mr Pelvis W Wo Contrast  Result Date: 05/10/2018 CLINICAL DATA:  38 year old female inpatient admitted with pulmonary embolism and enlarged myomatous uterus. History of tubal ligation. EXAM: MRI PELVIS WITHOUT AND WITH CONTRAST TECHNIQUE: Multiplanar multisequence MR imaging of the pelvis was performed both before and after administration of intravenous contrast. CONTRAST:  6 cc Gadavist IV COMPARISON:  05/08/2018 pelvic sonogram. FINDINGS: Urinary Tract:  Normal nondistended bladder.  Normal urethra. Bowel: Visualized small and large bowel are normal caliber with no bowel wall thickening. Vascular/Lymphatic: No pathologically enlarged lymph nodes in the pelvis. Small nonocclusive thrombus in the left common femoral vein (series 10/image 24). Nonocclusive tubular acute thrombi throughout the left common and external iliac veins. Apparent focal narrowing of the left common iliac vein (series 10/image 4), although this does not correlate with extrinsic compression from the enlarged myomatous uterus on these images. Asymmetric edema surrounding the length of the left common and external iliac veins. No evidence of right pelvic deep venous thrombosis. Reproductive: Uterus: The enlarged myomatous uterus measures 16.0 x 10.2 x 11.5 cm (volume = 983 cm^3). There are multiple enhancing uterine fibroids (greater than 10), with representative fibroids as follows: -right fundal intramural 6.7 x 4.8  x 4.9 cm (volume = 83 cm^3) fibroid -left uterine body submucosal 1.8 x 1.3 x 1.2 cm (volume = 1.5 cm^3) fibroid -posterior left upper uterine body 4.7 x 3.8 x 3.3 cm (volume = 31 cm^3) fibroid with approximately 40% submucosal component A few of the fibroids demonstrate heterogeneous T2 hyperintensity indicative of partial cystic degeneration. No intracavitary or pedunculated fibroids are evident. Inner myometrium (junctional zone) is poorly delineated and measures approximately 7 mm in thickness, which is within normal limits. Endometrium is distorted by surrounding fibroids and measures approximately 6 mm in bilayer thickness, which is within normal limits. No endometrial cavity fluid or focal endometrial mass. Scattered small  nabothian cysts in the cervix. Fibrous cervical stroma is intact. Ovaries and Adnexa: The right ovary measures 5.8 x 3.5 x 5.2 cm and is normal with several simple follicular cysts measuring up to the 3.2 cm. The left ovary measures 4.5 x 3.0 x 3.1 cm and is normal with a small hemorrhagic corpus luteum (series 4/image 10). There are no suspicious ovarian or adnexal masses. Other: No abnormal free fluid in the pelvis. No focal pelvic fluid collection. Musculoskeletal: No aggressive appearing focal osseous lesions. IMPRESSION: 1. Acute nonocclusive deep venous thrombosis in the left pelvis involving the left common and external iliac and left common femoral veins. Apparent focal narrowing of the left common iliac vein, which does not appear to correlate with extrinsic compression from the enlarged myomatous uterus. 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 2. Enlarged myomatous uterus as detailed. Please note that MRI can not reliably distinguish benign uterine fibroids from leiomyosarcoma. Although there are no overtly aggressive features associated with these uterine fibroids on this MRI study, clinical follow-up is necessary. 3. No ovarian or adnexal abnormality. Electronically Signed   By: Ilona Sorrel M.D.   On: 05/10/2018 20:57   US Pelvis (transabdominal Only)  Result Date: 05/08/2018 CLINICAL DATA:  Clinical concern for a large fibroid. EXAM: TRANSABDOMINAL ULTRASOUND OF PELVIS TECHNIQUE: Transabdominal ultrasound examination of the pelvis was performed including evaluation of the uterus, ovaries, adnexal regions, and pelvic cul-de-sac. COMPARISON:  None. FINDINGS: Uterus Measurements: 13.4 x 8.5 x 10.1 cm = volume: 602 mL. There is a large intramural myometrial mass within the fundus of the uterus measuring 9.5 by 11.2 by 8.2 cm. Endometrium The endometrium is poorly visualized. Right ovary Measurements: 5.5 x 5.2 x 3.3 cm = volume: 49.3 mL. Several benign-appearing cysts, the largest measuring 2.7 cm. Left ovary Measurements: 3.6 x 4.3 x 2.6 cm = volume: 21 mL. Normal appearance/no adnexal mass. Other findings:  No abnormal free fluid. IMPRESSION: Enlarged uterus containing a large myometrial mass within the fundus which measures 11.2 cm in greatest dimension. If surgical intervention is not planned, further evaluation with contrast-enhanced MRI of the pelvis may be considered. Normal appearance of the ovaries. Electronically Signed   By: Fidela Salisbury M.D.   On: 05/08/2018 22:23   Vas [place] Lower Extremity Venous (dvt)  Result Date: 05/08/2018  Lower Venous Study Risk Factors: Confirmed PE. Comparison Study: No prior study on file for comparison Performing Technologist: Sharion Dove RVS Supporting Technologist: June Leap RDMS, RVT  Examination Guidelines: A complete evaluation includes B-mode imaging, spectral Doppler, color Doppler, and power Doppler as needed of all accessible portions of each vessel. Bilateral testing is considered an integral part of a complete examination. Limited examinations for reoccurring indications may be performed as noted.  Right Venous Findings: +---------+---------------+---------+-----------+----------+-------+           CompressibilityPhasicitySpontaneityPropertiesSummary +---------+---------------+---------+-----------+----------+-------+ CFV      Full           Yes      Yes                          +---------+---------------+---------+-----------+----------+-------+ SFJ      Full                                                 +---------+---------------+---------+-----------+----------+-------+ FV Prox  Full                                                 +---------+---------------+---------+-----------+----------+-------+  FV Mid   Full                                                 +---------+---------------+---------+-----------+----------+-------+ FV DistalFull                                                 +---------+---------------+---------+-----------+----------+-------+ PFV      Full                                                 +---------+---------------+---------+-----------+----------+-------+ POP      Full           Yes      Yes                          +---------+---------------+---------+-----------+----------+-------+ PTV      Full                                                 +---------+---------------+---------+-----------+----------+-------+ PERO     Full                                                 +---------+---------------+---------+-----------+----------+-------+  Left Venous Findings: +---------+---------------+---------+-----------+----------+-------+          CompressibilityPhasicitySpontaneityPropertiesSummary +---------+---------------+---------+-----------+----------+-------+ CFV      Full           No       No                           +---------+---------------+---------+-----------+----------+-------+ SFJ      Full           No       No                           +---------+---------------+---------+-----------+----------+-------+ FV Prox  Full                                                  +---------+---------------+---------+-----------+----------+-------+ FV Mid   Full                                                 +---------+---------------+---------+-----------+----------+-------+ FV DistalFull                                                 +---------+---------------+---------+-----------+----------+-------+  PFV      Full           No       No                           +---------+---------------+---------+-----------+----------+-------+ POP      Full           No       No                           +---------+---------------+---------+-----------+----------+-------+ PTV      Full                                                 +---------+---------------+---------+-----------+----------+-------+ PERO     Full                                                 +---------+---------------+---------+-----------+----------+-------+ iliac    None           No       No                   Acute   +---------+---------------+---------+-----------+----------+-------+ Continuous waveforms throughout with no evidence of lower extremity DVT    Summary: Right: There is no evidence of deep vein thrombosis in the lower extremity. Left: There is no evidence of deep vein thrombosis in the lower extremity. Continuous waveforms throughout with no evidence of lower extremity DVT. Thrombus noted in the left iliac but does not extend into the IVC  *See table(s) above for measurements and observations. Electronically signed by Ruta Hinds MD on 05/08/2018 at 5:17:53 PM.    Final

## 2018-05-11 NOTE — Plan of Care (Signed)
  Problem: Health Behavior/Discharge Planning: Goal: Ability to manage health-related needs will improve Outcome: Progressing   Problem: Clinical Measurements: Goal: Will remain free from infection Outcome: Progressing   Problem: Nutrition: Goal: Adequate nutrition will be maintained Outcome: Not Progressing   Problem: Coping: Goal: Level of anxiety will decrease Outcome: Progressing   Problem: Pain Managment: Goal: General experience of comfort will improve Outcome: Not Progressing

## 2018-05-12 LAB — HEPARIN LEVEL (UNFRACTIONATED)
Heparin Unfractionated: 0.2 IU/mL — ABNORMAL LOW (ref 0.30–0.70)
Heparin Unfractionated: 0.52 IU/mL (ref 0.30–0.70)

## 2018-05-12 LAB — CBC
HEMATOCRIT: 29.1 % — AB (ref 36.0–46.0)
HEMOGLOBIN: 9.2 g/dL — AB (ref 12.0–15.0)
MCH: 27.9 pg (ref 26.0–34.0)
MCHC: 31.6 g/dL (ref 30.0–36.0)
MCV: 88.2 fL (ref 80.0–100.0)
Platelets: 355 10*3/uL (ref 150–400)
RBC: 3.3 MIL/uL — ABNORMAL LOW (ref 3.87–5.11)
RDW: 18 % — ABNORMAL HIGH (ref 11.5–15.5)
WBC: 9 10*3/uL (ref 4.0–10.5)
nRBC: 0 % (ref 0.0–0.2)

## 2018-05-12 NOTE — Progress Notes (Signed)
PROGRESS NOTE    Holly Graham  TKW:[id number] DOB: 1980/07/21 DOA: 05/08/2018 PCP: Patient, No Pcp Per     Brief Narrative:  Holly Graham is a 38 y.o.femalewithout significant personal medical history, positive family history of DVT, who presented to the ED with progressive dyspnea and left leg swelling found to have PE with cor pulmonale confirmed by CTA chest. Left iliac vein thrombosis was noted. Pelvic U/S demonstrated a large uterine fibroid with MRI recommended.  She was started on IV heparin.  Vascular surgery, OB/GYN also consulted.  New events last 24 hours / Subjective: Complaining of some nausea which resolved with medication.  Continues to have exertional dyspnea.  Remains on room air.  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 -IR consulted, felt pulmonary thrombolytics not indicated based on hemodynamic stability and no echocardiographic evidence of RV failure -Continue IV heparin  Left iliac vein thrombosis -Appreciate vascular surgery, planning for catheter-directed lytic therapy on Monday 2/17  Uterine fibroids, abnormal uterine bleeding -OB/GYN consulted, patient started on Megace 80 mg twice daily -Follow-up appointment 06/01/2018   DVT prophylaxis: IV heparin Code Status: Full code Family Communication: At bedside Disposition Plan: Planning for thrombectomy on Monday 2/17   Consultants:   Vascular surgery  OB/GYN  Procedures:   None  Antimicrobials:  Anti-infectives (From admission, onward)   None        Objective: Vitals:   05/11/18 1729 05/11/18 2007 05/12/18 0427 05/12/18 0825  BP: 111/72 111/77 108/74 123/81  Pulse: 78 79 71 69  Resp: 18  20 16   Temp: 98.3 F (36.8 C) 98.2 F (36.8 C) 98.3 F (36.8 C) 98.2 F (36.8 C)  TempSrc: Oral Oral Oral Oral  SpO2:  100% 100% 100% 100%  Weight:      Height:        Intake/Output Summary (Last 24 hours) at 05/12/2018 1300 Last data filed at 05/11/2018 1700 Gross per 24 hour  Intake 480 ml  Output -  Net 480 ml   Filed Weights   05/08/18 0059 05/08/18 1109  Weight: 63.5 kg 66.6 kg    Examination:  General exam: Appears calm and comfortable  Respiratory system: Clear to auscultation. Respiratory effort normal. Cardiovascular system: S1 & S2 heard, RRR. No JVD, murmurs, rubs, gallops or clicks. No pedal edema. Gastrointestinal system: Abdomen is nondistended, soft and nontender. No organomegaly or masses felt. Normal bowel sounds heard. Central nervous system: Alert and oriented. No focal neurological deficits. Extremities: Symmetric 5 x 5 power. Skin: No rashes, lesions or ulcers Psychiatry: Judgement and insight appear normal. Mood & affect appropriate.   Data Reviewed: I have personally reviewed following labs and imaging studies  CBC: Recent Labs  Lab 05/08/18 0110 05/08/18 0543 05/09/18 0247 05/10/18 0319 05/11/18 0244 05/12/18 0237  WBC 4.5  --  5.3 6.8 8.2 9.0  HGB 10.8* 9.5* 9.4* 9.5* 9.7* 9.2*  HCT 33.9* 28.0* 29.2* 30.5* 30.9* 29.1*  MCV 87.1  --  85.9 87.6 87.3 88.2  PLT 277  --  243 278 346 924   Basic Metabolic Panel: Recent Labs  Lab 05/08/18 0110 05/08/18 0543 05/09/18 0247 05/10/18 0319 05/11/18 0244  NA 137 138 137 135 137  K 3.1* 3.3* 3.3* 4.3 4.1  CL 95*  --  104 102 106  CO2 25  --  24 27 24  GLUCOSE 112*  --  103* 108* 84  BUN <5*  --  <5* <5* <5*  CREATININE 0.77  --  0.75 0.69 0.76  CALCIUM 9.1  --  8.6* 8.9 8.9  MG 1.7  --  1.8  --   --    GFR: Estimated Creatinine Clearance: 87.5 mL/min (by C-G formula based on SCr of 0.76 mg/dL). Liver Function Tests: Recent Labs  Lab 05/10/18 0319  AST 38  ALT 16  ALKPHOS 88  BILITOT 0.8  PROT 6.3*  ALBUMIN 3.1*   No results for input(s): LIPASE, AMYLASE in the last 168 hours. No results for  input(s): AMMONIA in the last 168 hours. Coagulation Profile: No results for input(s): INR, PROTIME in the last 168 hours. Cardiac Enzymes: No results for input(s): CKTOTAL, CKMB, CKMBINDEX, TROPONINI in the last 168 hours. BNP (last 3 results) No results for input(s): PROBNP in the last 8760 hours. HbA1C: No results for input(s): HGBA1C in the last 72 hours. CBG: No results for input(s): GLUCAP in the last 168 hours. Lipid Profile: No results for input(s): CHOL, HDL, LDLCALC, TRIG, CHOLHDL, LDLDIRECT in the last 72 hours. Thyroid Function Tests: Recent Labs    05/11/18 0244  TSH 1.301   Anemia Panel: No results for input(s): VITAMINB12, FOLATE, FERRITIN, TIBC, IRON, RETICCTPCT in the last 72 hours. Sepsis Labs: Recent Labs  Lab 05/08/18 0458 05/08/18 0835  LATICACIDVEN 2.3* 2.5*    Recent Results (from the past 240 hour(s))  MRSA PCR Screening     Status: None   Collection Time: 05/09/18  9:14 AM  Result Value Ref Range Status   MRSA by PCR NEGATIVE NEGATIVE Final    Comment:        The GeneXpert MRSA Assay (FDA approved for NASAL specimens only), is one component of a comprehensive MRSA colonization surveillance program. It is not intended to diagnose MRSA infection nor to guide or monitor treatment for MRSA infections. Performed at [facility], [place] [address], [place], [place] [address]        Radiology Studies: Mr Pelvis W Wo Contrast  Result Date: 05/10/2018 CLINICAL DATA:  38 year old female inpatient admitted with pulmonary embolism and enlarged myomatous uterus. History of tubal ligation. EXAM: MRI PELVIS WITHOUT AND WITH CONTRAST TECHNIQUE: Multiplanar multisequence MR imaging of the pelvis was performed both before and after administration of intravenous contrast. CONTRAST:  6 cc Gadavist IV COMPARISON:  05/08/2018 pelvic sonogram. FINDINGS: Urinary Tract:  Normal nondistended bladder.  Normal urethra. Bowel: Visualized small and large bowel are  normal caliber with no bowel wall thickening. Vascular/Lymphatic: No pathologically enlarged lymph nodes in the pelvis. Small nonocclusive thrombus in the left common femoral vein (series 10/image 24). Nonocclusive tubular acute thrombi throughout the left common and external iliac veins. Apparent focal narrowing of the left common iliac vein (series 10/image 4), although this does not correlate with extrinsic compression from the enlarged myomatous uterus on these images. Asymmetric edema surrounding the length of the left common and external iliac veins. No evidence of right pelvic deep venous thrombosis. Reproductive: Uterus: The enlarged myomatous uterus measures 16.0 x 10.2 x 11.5 cm (volume = 983 cm^3). There are multiple enhancing uterine fibroids (greater than 10), with representative fibroids as follows: -right fundal intramural 6.7 x 4.8 x 4.9 cm (volume = 83 cm^3) fibroid -left uterine body submucosal 1.8 x 1.3 x 1.2 cm (volume = 1.5 cm^3) fibroid -posterior left upper uterine body 4.7 x 3.8 x 3.3 cm (volume = 31 cm^3)  fibroid with approximately 40% submucosal component A few of the fibroids demonstrate heterogeneous T2 hyperintensity indicative of partial cystic degeneration. No intracavitary or pedunculated fibroids are evident. Inner myometrium (junctional zone) is poorly delineated and measures approximately 7 mm in thickness, which is within normal limits. Endometrium is distorted by surrounding fibroids and measures approximately 6 mm in bilayer thickness, which is within normal limits. No endometrial cavity fluid or focal endometrial mass. Scattered small nabothian cysts in the cervix. Fibrous cervical stroma is intact. Ovaries and Adnexa: The right ovary measures 5.8 x 3.5 x 5.2 cm and is normal with several simple follicular cysts measuring up to the 3.2 cm. The left ovary measures 4.5 x 3.0 x 3.1 cm and is normal with a small hemorrhagic corpus luteum (series 4/image 10). There are no suspicious  ovarian or adnexal masses. Other: No abnormal free fluid in the pelvis. No focal pelvic fluid collection. Musculoskeletal: No aggressive appearing focal osseous lesions. IMPRESSION: 1. Acute nonocclusive deep venous thrombosis in the left pelvis involving the left common and external iliac and left common femoral veins. Apparent focal narrowing of the left common iliac vein, which does not appear to correlate with extrinsic compression from the enlarged myomatous uterus. 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 2. Enlarged myomatous uterus as detailed. Please note that MRI can not reliably distinguish benign uterine fibroids from leiomyosarcoma. Although there are no overtly aggressive features associated with these uterine fibroids on this MRI study, clinical follow-up is necessary. 3. No ovarian or adnexal abnormality. Electronically Signed   By: Ilona Sorrel M.D.   On: 05/10/2018 20:57      Scheduled Meds: . megestrol  80 mg Oral BID  . metoprolol tartrate  12.5 mg Oral BID  . multivitamin with minerals  1 tablet Oral Daily   Continuous Infusions: . heparin 1,200 Units/hr (05/12/18 0449)     LOS: 4 days    Time spent: 35 minutes   Dessa Phi, DO [facility] [email or web address] 05/12/2018, 1:00 PM

## 2018-05-12 NOTE — Progress Notes (Signed)
[facility]  Subjective  - No new complaints.  Remains on heparin gtt for left iliac vein DVT.   Objective 123/81 69 98.2 F (36.8 C) (Oral) 16 100%  Intake/Output Summary (Last 24 hours) at 05/12/2018 1143 Last data filed at 05/11/2018 1700 Gross per 24 hour  Intake 480 ml  Output -  Net 480 ml    Left leg minimal swelling today  Laboratory Lab Results: Recent Labs    05/11/18 0244 05/12/18 0237  WBC 8.2 9.0  HGB 9.7* 9.2*  HCT 30.9* 29.1*  PLT 346 355   BMET Recent Labs    05/10/18 0319 05/11/18 0244  NA 135 137  K 4.3 4.1  CL 102 106  CO2 27 24  GLUCOSE 108* 84  BUN <5* <5*  CREATININE 0.69 0.76  CALCIUM 8.9 8.9    COAG No results found for: INR, PROTIME No results found for: PTT  Assessment/Planning: Appreciate ob-gyn input yesterday.  Patient wants to proceed with left iliac vein catheter based thrombectomy ([facility]).  I think this is the right decision given her young age and risk for post-thrombotic syndrome with anticoagulation alone.  Will post for PV lab on Monday with Dr. Donzetta Matters.  Please have patient NPO after midnight Sunday night.    Marty Heck 05/12/2018 11:43 AM --

## 2018-05-12 NOTE — Progress Notes (Addendum)
ANTICOAGULATION CONSULT NOTE - Follow Up Consult  Pharmacy Consult for Heparin Indication: pulmonary embolus  No Known Allergies  Patient Measurements: Height: 5\' 3"  (160 cm) Weight: 146 lb 12.8 oz (66.6 kg) IBW/kg (Calculated) : 52.4 Heparin Dosing Weight: 66.6 kg  Vital Signs: Temp: 98.3 F (36.8 C) (02/14 0427) Temp Source: Oral (02/14 0427) BP: 108/74 (02/14 0427) Pulse Rate: 71 (02/14 0427)  Labs: Recent Labs    05/10/18 0319 05/11/18 0244 05/11/18 1542 05/12/18 0237  HGB 9.5* 9.7*  --  9.2*  HCT 30.5* 30.9*  --  29.1*  PLT 278 346  --  355  HEPARINUNFRC 0.50 0.29* 0.37 0.20*  CREATININE 0.69 0.76  --   --     Estimated Creatinine Clearance: 87.5 mL/min (by C-G formula based on SCr of 0.76 mg/dL).  Assessment: 38yo female c/o swelling to LLE since 1400 Sun followed by SOB and chest tightness, CT reveals bilateral PE w/ evidence of right heart strain. No evidence of right heart strain on echo 2/10. 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  Pharmacy consulted for IV heparin dosing.  Heparin level is now subtherapeutic. Noted documentation of abnormal uterine bleeding and OBGYN started Megace. Hgb remains stable however.   Goal of Therapy:  Heparin level 0.3-0.7 units/ml Monitor platelets by anticoagulation protocol: Yes   Plan:  Increase heparin gtt to 1200 units/hr Check an 8 hr heparin level Daily heparin level and CBC F/U bleeding and vascular plans  Salome Arnt, PharmD, BCPS Please see AMION for all pharmacy numbers 05/12/2018 4:39 AM

## 2018-05-12 NOTE — Progress Notes (Signed)
ANTICOAGULATION CONSULT NOTE - Follow Up Consult  Pharmacy Consult for Heparin Indication: pulmonary embolus  No Known Allergies  Patient Measurements: Height: 5\' 3"  (160 cm) Weight: 146 lb 12.8 oz (66.6 kg) IBW/kg (Calculated) : 52.4 Heparin Dosing Weight: 66.6 kg  Vital Signs: Temp: 98.2 F (36.8 C) (02/14 0825) Temp Source: Oral (02/14 0825) BP: 123/81 (02/14 0825) Pulse Rate: 69 (02/14 0825)  Labs: Recent Labs    05/10/18 0319 05/11/18 0244 05/11/18 1542 05/12/18 0237 05/12/18 1305  HGB 9.5* 9.7*  --  9.2*  --   HCT 30.5* 30.9*  --  29.1*  --   PLT 278 346  --  355  --   HEPARINUNFRC 0.50 0.29* 0.37 0.20* 0.52  CREATININE 0.69 0.76  --   --   --     Estimated Creatinine Clearance: 87.5 mL/min (by C-G formula based on SCr of 0.76 mg/dL).  Assessment:  38yo female c/o swelling to LLE since 1400 Sun followed by SOB and chest tightness, CT reveals bilateral PE w/ evidence of right heart strain. No evidence of right heart strain on echo 2/10. 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  Pharmacy consulted for IV heparin dosing.   Heparin level is now therapeutic again (0.52) on 1200 units/hr. Abnormal uterine bleeding, Megace begun 2/13 per OB/Gyn. Patient reports some lessening of bleeding.  Vascular planning catheter-directed thrombectomy of left iliac vein on Monday, 05/15/18.  Goal of Therapy:  Heparin level 0.3-0.7 units/ml Monitor platelets by anticoagulation protocol: Yes   Plan:   Continue heparin drip at 1200 units/hr  Daily heparin level and CBC.  Procedure planned for 2/17.  Arty Baumgartner, [place] Pager: [phone] or phone: [phone] 05/12/2018,2:10 PM

## 2018-05-12 NOTE — Plan of Care (Signed)
  Problem: Clinical Measurements: Goal: Will remain free from infection Outcome: Progressing   Problem: Activity: Goal: Risk for activity intolerance will decrease Outcome: Progressing   Problem: Nutrition: Goal: Adequate nutrition will be maintained Outcome: Not Progressing   Problem: Coping: Goal: Level of anxiety will decrease Outcome: Progressing

## 2018-05-13 LAB — CBC
HCT: 28.6 % — ABNORMAL LOW (ref 36.0–46.0)
Hemoglobin: 8.8 g/dL — ABNORMAL LOW (ref 12.0–15.0)
MCH: 26.7 pg (ref 26.0–34.0)
MCHC: 30.8 g/dL (ref 30.0–36.0)
MCV: 86.9 fL (ref 80.0–100.0)
Platelets: 363 10*3/uL (ref 150–400)
RBC: 3.29 MIL/uL — ABNORMAL LOW (ref 3.87–5.11)
RDW: 17.9 % — ABNORMAL HIGH (ref 11.5–15.5)
WBC: 10 10*3/uL (ref 4.0–10.5)
nRBC: 0 % (ref 0.0–0.2)

## 2018-05-13 LAB — HEPARIN LEVEL (UNFRACTIONATED): Heparin Unfractionated: 0.34 IU/mL (ref 0.30–0.70)

## 2018-05-13 NOTE — Plan of Care (Signed)
  Problem: Health Behavior/Discharge Planning: Goal: Ability to manage health-related needs will improve Outcome: Progressing   Problem: Clinical Measurements: Goal: Will remain free from infection Outcome: Progressing   Problem: Clinical Measurements: Goal: Respiratory complications will improve Outcome: Progressing   Problem: Pain Managment: Goal: General experience of comfort will improve Outcome: Progressing

## 2018-05-13 NOTE — Progress Notes (Signed)
PROGRESS NOTE    Holly Graham  LZJ:[id number] DOB: December 14, 1980 DOA: 05/08/2018 PCP: Patient, No Pcp Per     Brief Narrative:  Holly Graham is a 38 y.o.femalewithout significant personal medical history, positive family history of DVT, who presented to the ED with progressive dyspnea and left leg swelling found to have PE with cor pulmonale confirmed by CTA chest. Left iliac vein thrombosis was noted. Pelvic U/S demonstrated a large uterine fibroid with MRI recommended.  She was started on IV heparin.  Vascular surgery, OB/GYN also consulted.  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 -IR consulted, felt pulmonary thrombolytics not indicated based on hemodynamic stability and no echocardiographic evidence of RV failure -Continue IV heparin  Left iliac vein thrombosis -Appreciate vascular surgery, planning for catheter-directed lytic therapy on Monday 2/17  Uterine fibroids, abnormal uterine bleeding -OB/GYN consulted, patient started on Megace 80 mg twice daily -Follow-up appointment 06/01/2018   DVT prophylaxis: IV heparin Code Status: Full code Family Communication: At bedside Disposition Plan: Planning for thrombectomy on Monday 2/17   Consultants:   Vascular surgery  OB/GYN  Procedures:   None  Antimicrobials:  Anti-infectives (From admission, onward)   None       Objective: Vitals:   05/12/18 0825 05/12/18 1548 05/12/18 2206 05/13/18 0826  BP: 123/81 102/69 (!) 130/99 (!) 96/57  Pulse: 69 68 75 66  Resp: 16 12 17 16   Temp: 98.2 F (36.8 C) 98.8 F (37.1 C) 98.6 F (37 C) 98.4 F (36.9 C)  TempSrc: Oral Oral Oral Oral  SpO2: 100% 100% 100% 100%  Weight:      Height:       No intake or output data in  the 24 hours ending 05/13/18 1235 Filed Weights   05/08/18 0059 05/08/18 1109  Weight: 63.5 kg 66.6 kg   Examination: General exam: Appears calm and comfortable  Respiratory system: Clear to auscultation. Respiratory effort normal. Cardiovascular system: S1 & S2 heard, RRR. No JVD, murmurs, rubs, gallops or clicks. No pedal edema. Gastrointestinal system: Abdomen is nondistended, soft and nontender. No organomegaly or masses felt. Normal bowel sounds heard. Central nervous system: Alert and oriented. No focal neurological deficits. Extremities: Symmetric 5 x 5 power. Skin: No rashes, lesions or ulcers Psychiatry: Judgement and insight appear normal. Mood & affect appropriate.    Data Reviewed: I have personally reviewed following labs and imaging studies  CBC: Recent Labs  Lab 05/09/18 0247 05/10/18 0319 05/11/18 0244 05/12/18 0237 05/13/18 0234  WBC 5.3 6.8 8.2 9.0 10.0  HGB 9.4* 9.5* 9.7* 9.2* 8.8*  HCT 29.2* 30.5* 30.9* 29.1* 28.6*  MCV 85.9 87.6 87.3 88.2 86.9  PLT 243 278 346 355 024   Basic Metabolic Panel: Recent Labs  Lab 05/08/18 0110 05/08/18 0543 05/09/18 0247 05/10/18 0319 05/11/18 0244  NA 137 138 137 135 137  K 3.1* 3.3* 3.3* 4.3 4.1  CL 95*  --  104 102 106  CO2 25  --  24 27 24   GLUCOSE 112*  --  103* 108* 84  BUN <5*  --  <5* <5* <5*  CREATININE 0.77  --  0.75 0.69 0.76  CALCIUM 9.1  --  8.6* 8.9 8.9  MG 1.7  --  1.8  --   --  GFR: Estimated Creatinine Clearance: 87.5 mL/min (by C-G formula based on SCr of 0.76 mg/dL). Liver Function Tests: Recent Labs  Lab 05/10/18 0319  AST 38  ALT 16  ALKPHOS 88  BILITOT 0.8  PROT 6.3*  ALBUMIN 3.1*   No results for input(s): LIPASE, AMYLASE in the last 168 hours. No results for input(s): AMMONIA in the last 168 hours. Coagulation Profile: No results for input(s): INR, PROTIME in the last 168 hours. Cardiac Enzymes: No results for input(s): CKTOTAL, CKMB, CKMBINDEX, TROPONINI in the last 168  hours. BNP (last 3 results) No results for input(s): PROBNP in the last 8760 hours. HbA1C: No results for input(s): HGBA1C in the last 72 hours. CBG: No results for input(s): GLUCAP in the last 168 hours. Lipid Profile: No results for input(s): CHOL, HDL, LDLCALC, TRIG, CHOLHDL, LDLDIRECT in the last 72 hours. Thyroid Function Tests: Recent Labs    05/11/18 0244  TSH 1.301   Anemia Panel: No results for input(s): VITAMINB12, FOLATE, FERRITIN, TIBC, IRON, RETICCTPCT in the last 72 hours. Sepsis Labs: Recent Labs  Lab 05/08/18 0458 05/08/18 0835  LATICACIDVEN 2.3* 2.5*    Recent Results (from the past 240 hour(s))  MRSA PCR Screening     Status: None   Collection Time: 05/09/18  9:14 AM  Result Value Ref Range Status   MRSA by PCR NEGATIVE NEGATIVE Final    Comment:        The GeneXpert MRSA Assay (FDA approved for NASAL specimens only), is one component of a comprehensive MRSA colonization surveillance program. It is not intended to diagnose MRSA infection nor to guide or monitor treatment for MRSA infections. Performed at [facility], [place] [address] [place], [place] [address]        Radiology Studies: No results found.    Scheduled Meds: . megestrol  80 mg Oral BID  . metoprolol tartrate  12.5 mg Oral BID  . multivitamin with minerals  1 tablet Oral Daily   Continuous Infusions: . heparin 1,200 Units/hr (05/13/18 0041)     LOS: 5 days    Time spent: 20 minutes   Holly Phi, DO [facility] [email or web address] 05/13/2018, 12:35 PM

## 2018-05-13 NOTE — Progress Notes (Signed)
ANTICOAGULATION CONSULT NOTE - Follow Up Consult  Pharmacy Consult for Heparin Indication: pulmonary embolus  No Known Allergies  Patient Measurements: Height: 5\' 3"  (160 cm) Weight: 146 lb 12.8 oz (66.6 kg) IBW/kg (Calculated) : 52.4 Heparin Dosing Weight: 66.6 kg  Vital Signs: Temp: 98.4 F (36.9 C) (02/15 0826) Temp Source: Oral (02/15 0826) BP: 96/57 (02/15 0826) Pulse Rate: 66 (02/15 0826)  Labs: Recent Labs    05/11/18 0244  05/12/18 0237 05/12/18 1305 05/13/18 0234  HGB 9.7*  --  9.2*  --  8.8*  HCT 30.9*  --  29.1*  --  28.6*  PLT 346  --  355  --  363  HEPARINUNFRC 0.29*   < > 0.20* 0.52 0.34  CREATININE 0.76  --   --   --   --    < > = values in this interval not displayed.    Estimated Creatinine Clearance: 87.5 mL/min (by C-G formula based on SCr of 0.76 mg/dL).  Assessment:  38yo female c/o swelling to LLE since 1400 Sun followed by SOB and chest tightness, CT reveals bilateral PE w/ evidence of right heart strain. No evidence of right heart strain on echo 2/10. 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  Pharmacy consulted for IV heparin dosing.   Heparin level is now therapeutic again (0.34) on 1200 units/hr. Abnormal uterine bleeding, Megace begun 2/13 per OB/Gyn.  Vascular planning catheter-directed thrombectomy of left iliac vein on Monday, 05/15/18.  Goal of Therapy:  Heparin level 0.3-0.7 units/ml Monitor platelets by anticoagulation protocol: Yes   Plan:   Continue heparin drip at 1200 units/hr  Daily heparin level and CBC.  Onnie Boer, PharmD, BCIDP, AAHIVP, CPP Infectious Disease Pharmacist 05/13/2018 10:46 AM

## 2018-05-14 LAB — CBC
HCT: 28.6 % — ABNORMAL LOW (ref 36.0–46.0)
HEMOGLOBIN: 8.9 g/dL — AB (ref 12.0–15.0)
MCH: 27.1 pg (ref 26.0–34.0)
MCHC: 31.1 g/dL (ref 30.0–36.0)
MCV: 86.9 fL (ref 80.0–100.0)
Platelets: 423 10*3/uL — ABNORMAL HIGH (ref 150–400)
RBC: 3.29 MIL/uL — ABNORMAL LOW (ref 3.87–5.11)
RDW: 18.2 % — ABNORMAL HIGH (ref 11.5–15.5)
WBC: 10 10*3/uL (ref 4.0–10.5)
nRBC: 0 % (ref 0.0–0.2)

## 2018-05-14 LAB — HEPARIN LEVEL (UNFRACTIONATED): Heparin Unfractionated: 0.58 IU/mL (ref 0.30–0.70)

## 2018-05-14 NOTE — Progress Notes (Signed)
PROGRESS NOTE    Holly Graham  JYN:[id number] DOB: 25-Jun-1980 DOA: 05/08/2018 PCP: Patient, No Pcp Per     Brief Narrative:  Holly Graham is a 38 y.o.femalewithout significant personal medical history, positive family history of DVT, who presented to the ED with progressive dyspnea and left leg swelling found to have PE with cor pulmonale confirmed by CTA chest. Left iliac vein thrombosis was noted. Pelvic U/S demonstrated a large uterine fibroid with MRI recommended.  She was started on IV heparin.  Vascular surgery, OB/GYN also consulted.  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 -IR consulted, felt pulmonary thrombolytics not indicated based on hemodynamic stability and no echocardiographic evidence of RV failure -Continue IV heparin  Left iliac vein thrombosis -Appreciate vascular surgery, planning for catheter-directed lytic therapy on Monday 2/17  Uterine fibroids, abnormal uterine bleeding -OB/GYN consulted, patient started on Megace 80 mg twice daily -Follow-up appointment 06/01/2018   DVT prophylaxis: IV heparin Code Status: Full code Family Communication: At bedside Disposition Plan: Planning for thrombectomy on Monday 2/17   Consultants:   Vascular surgery  OB/GYN  Procedures:   None  Antimicrobials:  Anti-infectives (From admission, onward)   None       Objective: Vitals:   05/13/18 0826 05/13/18 1502 05/13/18 2153 05/14/18 0733  BP: (!) 96/57 (!) 137/91 114/77 110/76  Pulse: 66 81 79 71  Resp: 16 18 18 16   Temp: 98.4 F (36.9 C) 98.3 F (36.8 C) 98.1 F (36.7 C) 98.2 F (36.8 C)  TempSrc: Oral Oral Oral Oral  SpO2: 100% 100% 100% 100%  Weight:      Height:       No intake or output data in the 24  hours ending 05/14/18 1018 Filed Weights   05/08/18 0059 05/08/18 1109  Weight: 63.5 kg 66.6 kg   Examination: General exam: Appears calm and comfortable  Respiratory system: Clear to auscultation. Respiratory effort normal. Cardiovascular system: S1 & S2 heard, RRR. No JVD, murmurs, rubs, gallops or clicks. No pedal edema. Gastrointestinal system: Abdomen is nondistended, soft and nontender. No organomegaly or masses felt. Normal bowel sounds heard. Central nervous system: Alert and oriented. No focal neurological deficits. Extremities: Symmetric 5 x 5 power. Skin: No rashes, lesions or ulcers Psychiatry: Judgement and insight appear normal. Mood & affect appropriate.    Data Reviewed: I have personally reviewed following labs and imaging studies  CBC: Recent Labs  Lab 05/10/18 0319 05/11/18 0244 05/12/18 0237 05/13/18 0234 05/14/18 0701  WBC 6.8 8.2 9.0 10.0 10.0  HGB 9.5* 9.7* 9.2* 8.8* 8.9*  HCT 30.5* 30.9* 29.1* 28.6* 28.6*  MCV 87.6 87.3 88.2 86.9 86.9  PLT 278 346 355 363 865*   Basic Metabolic Panel: Recent Labs  Lab 05/08/18 0110 05/08/18 0543 05/09/18 0247 05/10/18 0319 05/11/18 0244  NA 137 138 137 135 137  K 3.1* 3.3* 3.3* 4.3 4.1  CL 95*  --  104 102 106  CO2 25  --  24 27 24   GLUCOSE 112*  --  103* 108* 84  BUN <5*  --  <5* <5* <5*  CREATININE 0.77  --  0.75 0.69 0.76  CALCIUM 9.1  --  8.6* 8.9 8.9  MG 1.7  --  1.8  --   --  GFR: Estimated Creatinine Clearance: 87.5 mL/min (by C-G formula based on SCr of 0.76 mg/dL). Liver Function Tests: Recent Labs  Lab 05/10/18 0319  AST 38  ALT 16  ALKPHOS 88  BILITOT 0.8  PROT 6.3*  ALBUMIN 3.1*   No results for input(s): LIPASE, AMYLASE in the last 168 hours. No results for input(s): AMMONIA in the last 168 hours. Coagulation Profile: No results for input(s): INR, PROTIME in the last 168 hours. Cardiac Enzymes: No results for input(s): CKTOTAL, CKMB, CKMBINDEX, TROPONINI in the last 168  hours. BNP (last 3 results) No results for input(s): PROBNP in the last 8760 hours. HbA1C: No results for input(s): HGBA1C in the last 72 hours. CBG: No results for input(s): GLUCAP in the last 168 hours. Lipid Profile: No results for input(s): CHOL, HDL, LDLCALC, TRIG, CHOLHDL, LDLDIRECT in the last 72 hours. Thyroid Function Tests: No results for input(s): TSH, T4TOTAL, FREET4, T3FREE, THYROIDAB in the last 72 hours. Anemia Panel: No results for input(s): VITAMINB12, FOLATE, FERRITIN, TIBC, IRON, RETICCTPCT in the last 72 hours. Sepsis Labs: Recent Labs  Lab 05/08/18 0458 05/08/18 0835  LATICACIDVEN 2.3* 2.5*    Recent Results (from the past 240 hour(s))  MRSA PCR Screening     Status: None   Collection Time: 05/09/18  9:14 AM  Result Value Ref Range Status   MRSA by PCR NEGATIVE NEGATIVE Final    Comment:        The GeneXpert MRSA Assay (FDA approved for NASAL specimens only), is one component of a comprehensive MRSA colonization surveillance program. It is not intended to diagnose MRSA infection nor to guide or monitor treatment for MRSA infections. Performed at [facility], [place] [address], [place], [place] [address]        Radiology Studies: No results found.    Scheduled Meds: . megestrol  80 mg Oral BID  . metoprolol tartrate  12.5 mg Oral BID  . multivitamin with minerals  1 tablet Oral Daily   Continuous Infusions: . heparin 1,200 Units/hr (05/13/18 2153)     LOS: 6 days    Time spent: 20 minutes   Dessa Phi, DO [facility] [email or web address] 05/14/2018, 10:18 AM

## 2018-05-14 NOTE — Progress Notes (Signed)
ANTICOAGULATION CONSULT NOTE - Follow Up Consult  Pharmacy Consult for Heparin Indication: pulmonary embolus  No Known Allergies  Patient Measurements: Height: 5\' 3"  (160 cm) Weight: 146 lb 12.8 oz (66.6 kg) IBW/kg (Calculated) : 52.4 Heparin Dosing Weight: 66.6 kg  Vital Signs: Temp: 98.2 F (36.8 C) (02/16 0733) Temp Source: Oral (02/16 0733) BP: 110/76 (02/16 0733) Pulse Rate: 71 (02/16 0733)  Labs: Recent Labs    05/12/18 0237 05/12/18 1305 05/13/18 0234 05/14/18 0701  HGB 9.2*  --  8.8* 8.9*  HCT 29.1*  --  28.6* 28.6*  PLT 355  --  363 423*  HEPARINUNFRC 0.20* 0.52 0.34 0.58    Estimated Creatinine Clearance: 87.5 mL/min (by C-G formula based on SCr of 0.76 mg/dL).  Assessment:  38yo female c/o swelling to LLE since 1400 Sun followed by SOB and chest tightness, CT reveals bilateral PE w/ evidence of right heart strain. No evidence of right heart strain on echo 2/10. 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  Pharmacy consulted for IV heparin dosing.   Heparin level is now therapeutic again (0.58) on 1200 units/hr. Abnormal uterine bleeding, Megace begun 2/13 per OB/Gyn.  Vascular planning catheter-directed thrombectomy of left iliac vein on Monday, 05/15/18.  Goal of Therapy:  Heparin level 0.3-0.7 units/ml Monitor platelets by anticoagulation protocol: Yes   Plan:   Continue heparin drip at 1200 units/hr  Daily heparin level and CBC.  Onnie Boer, PharmD, BCIDP, AAHIVP, CPP Infectious Disease Pharmacist 05/14/2018 10:00 AM

## 2018-05-14 NOTE — Progress Notes (Signed)
[facility]  Subjective  - feels ok   Objective 110/76 71 98.2 F (36.8 C) (Oral) 16 100% No intake or output data in the 24 hours ending 05/14/18 1241  Assessment/Planning: No real leg swelling at this point Answered questions regarding procedure tomorrow NpO p midnight  Ruta Hinds 05/14/2018 12:41 PM --  Laboratory Lab Results: Recent Labs    05/13/18 0234 05/14/18 0701  WBC 10.0 10.0  HGB 8.8* 8.9*  HCT 28.6* 28.6*  PLT 363 423*   BMET No results for input(s): NA, K, CL, CO2, GLUCOSE, BUN, CREATININE, CALCIUM in the last 72 hours.  COAG No results found for: INR, PROTIME No results found for: PTT

## 2018-05-15 ENCOUNTER — Encounter (HOSPITAL_COMMUNITY)
Admission: EM | Disposition: A | Discharge status: Home / Self Care | Payer: Self-pay | Source: Home / Self Care | Attending: Internal Medicine

## 2018-05-15 ENCOUNTER — Encounter (HOSPITAL_COMMUNITY): Payer: Self-pay | Admitting: Vascular Surgery

## 2018-05-15 DIAGNOSIS — I871 Compression of vein: Secondary | ICD-10-CM

## 2018-05-15 HISTORY — PX: PERIPHERAL VASCULAR THROMBECTOMY: CATH118306

## 2018-05-15 HISTORY — PX: INTRAVASCULAR ULTRASOUND/IVUS: CATH118244

## 2018-05-15 LAB — CBC
HEMATOCRIT: 30 % — AB (ref 36.0–46.0)
HEMOGLOBIN: 9.3 g/dL — AB (ref 12.0–15.0)
MCH: 26.5 pg (ref 26.0–34.0)
MCHC: 31 g/dL (ref 30.0–36.0)
MCV: 85.5 fL (ref 80.0–100.0)
Platelets: 470 10*3/uL — ABNORMAL HIGH (ref 150–400)
RBC: 3.51 MIL/uL — ABNORMAL LOW (ref 3.87–5.11)
RDW: 18 % — ABNORMAL HIGH (ref 11.5–15.5)
WBC: 11 10*3/uL — ABNORMAL HIGH (ref 4.0–10.5)
nRBC: 0 % (ref 0.0–0.2)

## 2018-05-15 LAB — HEPARIN LEVEL (UNFRACTIONATED)
Heparin Unfractionated: 0.48 IU/mL (ref 0.30–0.70)
Heparin Unfractionated: <0.1 IU/mL — ABNORMAL LOW (ref 0.30–0.70)

## 2018-05-15 SURGERY — PERIPHERAL VASCULAR THROMBECTOMY
Anesthesia: LOCAL

## 2018-05-15 MED ORDER — SODIUM CHLORIDE 0.9% FLUSH: 3.0000 mL | INTRAVENOUS | Status: DC | PRN

## 2018-05-15 MED ORDER — HEPARIN SODIUM (PORCINE) 1000 UNIT/ML IJ SOLN
INTRAMUSCULAR | Status: DC | PRN
  Administered 2018-05-15: 7000 [IU] via INTRAVENOUS

## 2018-05-15 MED ORDER — IODIXANOL 320 MG/ML IV SOLN
INTRAVENOUS | Status: DC | PRN
  Administered 2018-05-15: 10 mL via INTRAVENOUS

## 2018-05-15 MED ORDER — LIDOCAINE HCL (PF) 1 % IJ SOLN
INTRAMUSCULAR | Status: AC
  Filled 2018-05-15: qty 30

## 2018-05-15 MED ORDER — FENTANYL CITRATE (PF) 100 MCG/2ML IJ SOLN
INTRAMUSCULAR | Status: DC | PRN
  Administered 2018-05-15: 25 ug via INTRAVENOUS
  Administered 2018-05-15: 50 ug via INTRAVENOUS

## 2018-05-15 MED ORDER — KETOROLAC TROMETHAMINE 30 MG/ML IJ SOLN
30.0000 mg | Freq: Once | INTRAMUSCULAR | Status: AC
  Administered 2018-05-15: 30 mg via INTRAVENOUS
  Filled 2018-05-15: qty 1

## 2018-05-15 MED ORDER — ASPIRIN EC 81 MG PO TBEC
81.0000 mg | DELAYED_RELEASE_TABLET | Freq: Every day | ORAL | Status: DC
  Administered 2018-05-16 – 2018-05-18 (×3): 81 mg via ORAL
  Filled 2018-05-15 (×3): qty 1

## 2018-05-15 MED ORDER — FENTANYL CITRATE (PF) 100 MCG/2ML IJ SOLN
INTRAMUSCULAR | Status: AC
  Filled 2018-05-15: qty 2

## 2018-05-15 MED ORDER — HEPARIN (PORCINE) IN NACL 1000-0.9 UT/500ML-% IV SOLN
INTRAVENOUS | Status: AC
  Filled 2018-05-15: qty 500

## 2018-05-15 MED ORDER — HYDRALAZINE HCL 20 MG/ML IJ SOLN: 5.0000 mg | INTRAMUSCULAR | Status: DC | PRN

## 2018-05-15 MED ORDER — SODIUM CHLORIDE 0.9% FLUSH
3.0000 mL | Freq: Two times a day (BID) | INTRAVENOUS | Status: DC
  Administered 2018-05-15 – 2018-05-17 (×3): 3 mL via INTRAVENOUS

## 2018-05-15 MED ORDER — SODIUM CHLORIDE 0.9 % IV SOLN
250.0000 mL | INTRAVENOUS | Status: DC | PRN
  Administered 2018-05-17: 250 mL via INTRAVENOUS

## 2018-05-15 MED ORDER — ONDANSETRON HCL 4 MG/2ML IJ SOLN: 4.0000 mg | Freq: Four times a day (QID) | INTRAMUSCULAR | Status: DC | PRN

## 2018-05-15 MED ORDER — MIDAZOLAM HCL 2 MG/2ML IJ SOLN
INTRAMUSCULAR | Status: DC | PRN
  Administered 2018-05-15 (×2): 1 mg via INTRAVENOUS

## 2018-05-15 MED ORDER — LABETALOL HCL 5 MG/ML IV SOLN: 10.0000 mg | INTRAVENOUS | Status: DC | PRN

## 2018-05-15 MED ORDER — LIDOCAINE HCL (PF) 1 % IJ SOLN
INTRAMUSCULAR | Status: DC | PRN
  Administered 2018-05-15: 15 mL via INTRADERMAL

## 2018-05-15 MED ORDER — ACETAMINOPHEN 325 MG PO TABS
ORAL_TABLET | ORAL | Status: AC
  Filled 2018-05-15: qty 2

## 2018-05-15 MED ORDER — HEPARIN (PORCINE) IN NACL 1000-0.9 UT/500ML-% IV SOLN
INTRAVENOUS | Status: DC | PRN
  Administered 2018-05-15: 500 mL

## 2018-05-15 MED ORDER — SODIUM CHLORIDE 0.9 % IV SOLN
INTRAVENOUS | Status: AC
  Administered 2018-05-15: 12:00:00 via INTRAVENOUS

## 2018-05-15 MED ORDER — SODIUM CHLORIDE 0.9 % IV SOLN
INTRAVENOUS | Status: DC
  Administered 2018-05-15: 06:00:00 via INTRAVENOUS

## 2018-05-15 MED ORDER — MIDAZOLAM HCL 2 MG/2ML IJ SOLN
INTRAMUSCULAR | Status: AC
  Filled 2018-05-15: qty 2

## 2018-05-15 MED ORDER — HEPARIN SODIUM (PORCINE) 1000 UNIT/ML IJ SOLN
INTRAMUSCULAR | Status: AC
  Filled 2018-05-15: qty 1

## 2018-05-15 MED ORDER — ACETAMINOPHEN 325 MG PO TABS
650.0000 mg | ORAL_TABLET | ORAL | Status: DC | PRN
  Administered 2018-05-15: 650 mg via ORAL

## 2018-05-15 MED ORDER — HEPARIN (PORCINE) 25000 UT/250ML-% IV SOLN
1400.0000 [IU]/h | INTRAVENOUS | Status: DC
  Administered 2018-05-15 (×2): 1200 [IU]/h via INTRAVENOUS
  Administered 2018-05-16: 1400 [IU]/h via INTRAVENOUS
  Filled 2018-05-15: qty 250

## 2018-05-15 SURGICAL SUPPLY — 15 items
BAG SNAP BAND KOVER 36X36 (MISCELLANEOUS) ×2 IMPLANT
BALLN MUSTANG 12X80X75 (BALLOONS) ×2
BALLOON MUSTANG 12X80X75 (BALLOONS) ×1 IMPLANT
CATH ANGIO 5F BER2 100CM (CATHETERS) ×2 IMPLANT
CATH RETRIEVER CLOT 16MMX105CM (CATHETERS) ×2 IMPLANT
CATH VISIONS PV .035 IVUS (CATHETERS) ×2 IMPLANT
COVER DOME SNAP 22 D (MISCELLANEOUS) ×2 IMPLANT
GLIDEWIRE ADV .035X260CM (WIRE) ×2 IMPLANT
KIT ENCORE 26 ADVANTAGE (KITS) ×4 IMPLANT
KIT MICROPUNCTURE NIT STIFF (SHEATH) ×2 IMPLANT
SHEATH CLOT RETRIEVER (SHEATH) ×2 IMPLANT
SHEATH PINNACLE 8F 10CM (SHEATH) ×2 IMPLANT
SHEATH PROBE COVER 6X72 (BAG) ×2 IMPLANT
STENT VICI VENOUS 14X120 (Permanent Stent) ×2 IMPLANT
TRAY PV CATH (CUSTOM PROCEDURE TRAY) ×2 IMPLANT

## 2018-05-15 NOTE — Progress Notes (Addendum)
Paged Dr. Donzetta Matters, patient's bedrest was up, patient stood up from bed, cath site started bleeding. Pressure held,  Waiting for call back.   Received orders to hold heparin for 4 more hours and continue bedrest for 4 more hours, until 19:45.

## 2018-05-15 NOTE — Progress Notes (Signed)
ANTICOAGULATION CONSULT NOTE - Follow Up Consult  Pharmacy Consult for Heparin Indication: pulmonary embolus  No Known Allergies  Patient Measurements: Height: 5\' 3"  (160 cm) Weight: 146 lb 12.8 oz (66.6 kg) IBW/kg (Calculated) : 52.4 Heparin Dosing Weight: 66.6 kg  Vital Signs: Temp: 98.6 F (37 C) (02/17 1241) Temp Source: Oral (02/17 1241) BP: 117/84 (02/17 1300) Pulse Rate: 73 (02/17 1300)  Labs: Recent Labs    05/13/18 0234 05/14/18 0701 05/15/18 0216  HGB 8.8* 8.9* 9.3*  HCT 28.6* 28.6* 30.0*  PLT 363 423* 470*  HEPARINUNFRC 0.34 0.58 0.48    Estimated Creatinine Clearance: 87.5 mL/min (by C-G formula based on SCr of 0.76 mg/dL).  Assessment:  38yo female c/o swelling to LLE since 1400 Sun followed by SOB and chest tightness, CT reveals bilateral PE w/ evidence of right heart strain. No evidence of right heart strain on echo 2/10. 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  Pharmacy consulted for IV heparin dosing. S/p Vascular - catheter-directed thrombectomy of left iliac vein Monday, 05/15/18. Restart heparin drip 4hr after sheath pulled - sheath out 10am, restart heparin at 2pm  Abnormal uterine bleeding, Megace begun 2/13 per OB/Gyn.     Goal of Therapy:  Heparin level 0.3-0.7 units/ml Monitor platelets by anticoagulation protocol: Yes   Plan:  Restart heparin drip at 1200 units/hr Heparin level 6hr after restart Daily heparin level and CBC.    Bonnita Nasuti Pharm.D. CPP, BCPS Clinical Pharmacist [phone] 05/15/2018 1:36 PM

## 2018-05-15 NOTE — Progress Notes (Signed)
Received patient from PACU, CCMD verified x2, telemetry applied, VSS, will continue to monitor and provide care.

## 2018-05-15 NOTE — Progress Notes (Signed)
PROGRESS NOTE    Ellisyn Hoggard  FKC:[id number] DOB: June 21, 1980 DOA: 05/08/2018 PCP: Patient, No Pcp Per     Brief Narrative:  Addisen Hoggard is a 38 y.o.femalewithout significant personal medical history, positive family history of DVT, who presented to the ED with progressive dyspnea and left leg swelling found to have PE with cor pulmonale confirmed by CTA chest. Left iliac vein thrombosis was noted. Pelvic U/S demonstrated a large uterine fibroid with MRI recommended.  She was started on IV heparin.  Vascular surgery, OB/GYN also consulted.  New events last 24 hours / Subjective: Nausea overnight and this morning. Awaiting thrombectomy procedure today.   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 -IR consulted, felt pulmonary thrombolytics not indicated based on hemodynamic stability and no echocardiographic evidence of RV failure -Continue IV heparin  Left iliac vein thrombosis -Appreciate vascular surgery, planning for catheter-directed lytic therapy today   Uterine fibroids, abnormal uterine bleeding -OB/GYN consulted, patient started on Megace 80 mg twice daily -Follow-up appointment 06/01/2018   DVT prophylaxis: IV heparin Code Status: Full code Family Communication: No family at bedside  Disposition Plan: Planning for thrombectomy today    Consultants:   Vascular surgery  OB/GYN  Procedures:   None  Antimicrobials:  Anti-infectives (From admission, onward)   None       Objective: Vitals:   05/14/18 1650 05/14/18 2249 05/15/18 0735 05/15/18 1024  BP: 108/64 124/86 106/61 135/79  Pulse: 72 80 65 71  Resp: 17 19 18 12   Temp: 98.2 F (36.8 C) 98.7 F (37.1 C) 98.8 F (37.1 C)   TempSrc: Oral Oral Oral   SpO2: 100% 100% 100%   Weight:      Height:       No intake or output  data in the 24 hours ending 05/15/18 1040 Filed Weights   05/08/18 0059 05/08/18 1109  Weight: 63.5 kg 66.6 kg   Examination: General exam: Appears calm and comfortable  Respiratory system: Clear to auscultation. Respiratory effort normal. Cardiovascular system: S1 & S2 heard, RRR. No JVD, murmurs, rubs, gallops or clicks. No pedal edema. Gastrointestinal system: Abdomen is nondistended, soft and nontender. No organomegaly or masses felt. Normal bowel sounds heard. Central nervous system: Alert and oriented. No focal neurological deficits. Extremities: Symmetric 5 x 5 power. Skin: No rashes, lesions or ulcers Psychiatry: Judgement and insight appear normal. Mood & affect appropriate.    Data Reviewed: I have personally reviewed following labs and imaging studies  CBC: Recent Labs  Lab 05/11/18 0244 05/12/18 0237 05/13/18 0234 05/14/18 0701 05/15/18 0216  WBC 8.2 9.0 10.0 10.0 11.0*  HGB 9.7* 9.2* 8.8* 8.9* 9.3*  HCT 30.9* 29.1* 28.6* 28.6* 30.0*  MCV 87.3 88.2 86.9 86.9 85.5  PLT 346 355 363 423* 749*   Basic Metabolic Panel: Recent Labs  Lab 05/09/18 0247 05/10/18 0319 05/11/18 0244  NA 137 135 137  K 3.3* 4.3 4.1  CL 104 102 106  CO2 24 27 24   GLUCOSE 103* 108* 84  BUN <5* <5* <5*  CREATININE 0.75 0.69 0.76  CALCIUM 8.6* 8.9 8.9  MG 1.8  --   --    GFR: Estimated Creatinine Clearance: 87.5 mL/min (by C-G formula based on SCr of 0.76 mg/dL). Liver Function Tests: Recent Labs  Lab 05/10/18 0319  AST 38  ALT 16  ALKPHOS 88  BILITOT 0.8  PROT 6.3*  ALBUMIN 3.1*   No results for input(s): LIPASE, AMYLASE in the last 168 hours. No results for input(s): AMMONIA in the last 168 hours. Coagulation Profile: No results for input(s): INR, PROTIME in the last 168 hours. Cardiac Enzymes: No results for input(s): CKTOTAL, CKMB, CKMBINDEX, TROPONINI in the last 168 hours. BNP (last 3 results) No results for input(s): PROBNP in the last 8760 hours. HbA1C: No  results for input(s): HGBA1C in the last 72 hours. CBG: No results for input(s): GLUCAP in the last 168 hours. Lipid Profile: No results for input(s): CHOL, HDL, LDLCALC, TRIG, CHOLHDL, LDLDIRECT in the last 72 hours. Thyroid Function Tests: No results for input(s): TSH, T4TOTAL, FREET4, T3FREE, THYROIDAB in the last 72 hours. Anemia Panel: No results for input(s): VITAMINB12, FOLATE, FERRITIN, TIBC, IRON, RETICCTPCT in the last 72 hours. Sepsis Labs: No results for input(s): PROCALCITON, LATICACIDVEN in the last 168 hours.  Recent Results (from the past 240 hour(s))  MRSA PCR Screening     Status: None   Collection Time: 05/09/18  9:14 AM  Result Value Ref Range Status   MRSA by PCR NEGATIVE NEGATIVE Final    Comment:        The GeneXpert MRSA Assay (FDA approved for NASAL specimens only), is one component of a comprehensive MRSA colonization surveillance program. It is not intended to diagnose MRSA infection nor to guide or monitor treatment for MRSA infections. Performed at [facility], [place] [address] [place], [place] [address]        Radiology Studies: No results found.    Scheduled Meds: . [MAR Hold] megestrol  80 mg Oral BID  . [MAR Hold] metoprolol tartrate  12.5 mg Oral BID  . [MAR Hold] multivitamin with minerals  1 tablet Oral Daily   Continuous Infusions: . sodium chloride 100 mL/hr at 05/15/18 0612  . sodium chloride    . heparin 1,200 Units/hr (05/14/18 1849)     LOS: 7 days    Time spent: 20 minutes   Dessa Phi, DO [facility] [email or web address] 05/15/2018, 10:40 AM

## 2018-05-15 NOTE — Progress Notes (Signed)
ANTICOAGULATION CONSULT NOTE - Follow Up Consult  Pharmacy Consult for Heparin Indication: pulmonary embolus  No Known Allergies  Patient Measurements: Height: 5\' 3"  (160 cm) Weight: 146 lb 12.8 oz (66.6 kg) IBW/kg (Calculated) : 52.4 Heparin Dosing Weight: 66.6 kg  Vital Signs: Temp: 98 F (36.7 C) (02/17 2000) Temp Source: Oral (02/17 2000) BP: 138/89 (02/17 2000) Pulse Rate: 86 (02/17 2000)  Labs: Recent Labs    05/13/18 0234 05/14/18 0701 05/15/18 0216 05/15/18 1950  HGB 8.8* 8.9* 9.3*  --   HCT 28.6* 28.6* 30.0*  --   PLT 363 423* 470*  --   HEPARINUNFRC 0.34 0.58 0.48 <0.10*    Estimated Creatinine Clearance: 87.5 mL/min (by C-G formula based on SCr of 0.76 mg/dL).  Assessment:  38yo female c/o swelling to LLE since 1400 Sun followed by SOB and chest tightness, CT reveals bilateral PE w/ evidence of right heart strain. No evidence of right heart strain on echo 2/10. 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  Pharmacy consulted for IV heparin dosing. S/p Vascular - catheter-directed thrombectomy of left iliac vein Monday, 05/15/18. Restart heparin drip 4hr after sheath pulled - sheath out 10am, restart heparin at 2pm  PM heparin level < 0.10  Abnormal uterine bleeding, Megace begun 2/13 per OB/Gyn.     Goal of Therapy:  Heparin level 0.3-0.7 units/ml Monitor platelets by anticoagulation protocol: Yes   Plan:  Increase heparin to 1450 units / hr Daily heparin level and CBC.  Thank you Anette Guarneri, PharmD [phone]  05/15/2018 8:34 PM

## 2018-05-15 NOTE — Progress Notes (Signed)
  Progress Note    05/15/2018 9:13 AM Day of Surgery  Subjective; no overnight complaints  Vitals:   05/14/18 2249 05/15/18 0735  BP: 124/86 106/61  Pulse: 80 65  Resp: 19 18  Temp: 98.7 F (37.1 C) 98.8 F (37.1 C)  SpO2: 100% 100%    Physical Exam: aaox3 Non labored respirations Left leg edema improved  CBC    Component Value Date/Time   WBC 11.0 (H) 05/15/2018 0216   RBC 3.51 (L) 05/15/2018 0216   HGB 9.3 (L) 05/15/2018 0216   HCT 30.0 (L) 05/15/2018 0216   PLT 470 (H) 05/15/2018 0216   MCV 85.5 05/15/2018 0216   MCH 26.5 05/15/2018 0216   MCHC 31.0 05/15/2018 0216   RDW 18.0 (H) 05/15/2018 0216    BMET    Component Value Date/Time   NA 137 05/11/2018 0244   K 4.1 05/11/2018 0244   CL 106 05/11/2018 0244   CO2 24 05/11/2018 0244   GLUCOSE 84 05/11/2018 0244   BUN <5 (L) 05/11/2018 0244   CREATININE 0.76 05/11/2018 0244   CALCIUM 8.9 05/11/2018 0244   GFRNONAA >60 05/11/2018 0244   GFRAA >60 05/11/2018 0244    INR No results found for: INR  No intake or output data in the 24 hours ending 05/15/18 0913   Assessment:  38 y.o. female is here with left common iliac/external iliac/common femoral vein dvt  Plan: pv lab today for ivus, venogram and possible thrombectomy and possible stenting.   Brandon C. Donzetta Matters, MD [facility] Office: [phone] Pager: [phone]  05/15/2018 9:13 AM

## 2018-05-15 NOTE — Op Note (Signed)
    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 Donzetta Matters, MD Procedure Performed: 1.  Ultrasound-guided cannulation left popliteal vein 2.  Intravascular ultrasound left femoral vein, left common femoral vein, left common external iliac veins and IVC 3.  Stent of left common external iliac veins with 14 x 120 mm Vici 4.  Central venogram 5.  Mechanical thrombectomy of left common, external iliac veins and common femoral vein with Inari Clottriever 6.  Moderate sedation with fentanyl Versed for 49 minutes  Indications: 38 year old female presented with left lower extremity swelling was found to have may Thurner syndrome with acute appearing clot in her left common extremity leg veins.  She is indicated for intravascular ultrasound with thrombectomy possible stenting.  Findings: By intravascular ultrasound there was acute and chronic appearing thrombus at the confluence of the common external leg veins and she was occluded at the confluence of the IVC.  At completion the left common iliac vein had transverse diameter of 12 mm were previously was occluded the stent appears patent all the way through its course the external eye vein measures approximately 9 mm but is fully deployed.  There is no further rouleaux flow that was present in the common femoral and femoral veins prior to mechanical thrombectomy and stenting.   Procedure:  The patient was identified in the holding area and taken to room 8.  The patient was then placed supine on the table and prepped and draped in the usual sterile fashion.  A time out was called.  Ultrasound was used to evaluate the left popliteal vein this was cannulated with micropuncture needle followed by wire sheath with direct ultrasound visualization.  An image was saved the permanent record.  An 8 French sheath was placed over  Glidewire advantage.  The patient was fully heparinized.  We used bare catheter and Glidewire advanced reverse into the IVC and placed the wire to the level of the SVC and marked it on the back table.  Vascular ultrasound was performed from the femoral vein to the IVC with the above findings.  There was rouleaux flow in the left femoral vein no acute appearing clot but there was acute and chronic appearing clot in the common and external iliac veins on the left.  We elected to perform mechanical thrombectomy which was done with Inari.  We made 3 passes returned some acute appearing clot but mostly chronic appearing thrombus.  Intravascular ultrasound after this demonstrated a channel there was no further rouleaux flow.  Satisfied with this we elected to stent placed a 14 x 120 mm Vici stent.  This was postdilated with 12 mm balloon.  Completion intravascular ultrasound demonstrated patency were previously we were occluded there was a 12 mm lumen now in anterior posterior view.  We performed venography from the common femoral vein which demonstrated brisk flow no further hold-ups and there was no collateralization.  Satisfied with this we removed our catheter wire and sheath was pulled pressure held until hemostasis obtained.  She tolerated procedure without immediate complication.  Contrast: 10 cc   Rayann Jolley C. Donzetta Matters, MD [facility] Office: [phone] Pager: [phone]

## 2018-05-16 ENCOUNTER — Inpatient Hospital Stay (HOSPITAL_COMMUNITY): Payer: Medicaid Other

## 2018-05-16 LAB — CBC
HCT: 27.9 % — ABNORMAL LOW (ref 36.0–46.0)
Hemoglobin: 8.8 g/dL — ABNORMAL LOW (ref 12.0–15.0)
MCH: 27 pg (ref 26.0–34.0)
MCHC: 31.5 g/dL (ref 30.0–36.0)
MCV: 85.6 fL (ref 80.0–100.0)
Platelets: 454 10*3/uL — ABNORMAL HIGH (ref 150–400)
RBC: 3.26 MIL/uL — ABNORMAL LOW (ref 3.87–5.11)
RDW: 17.7 % — ABNORMAL HIGH (ref 11.5–15.5)
WBC: 11 10*3/uL — ABNORMAL HIGH (ref 4.0–10.5)
nRBC: 0 % (ref 0.0–0.2)

## 2018-05-16 LAB — BASIC METABOLIC PANEL
Anion gap: 11 (ref 5–15)
BUN: 6 mg/dL (ref 6–20)
CO2: 20 mmol/L — ABNORMAL LOW (ref 22–32)
Calcium: 9.1 mg/dL (ref 8.9–10.3)
Chloride: 104 mmol/L (ref 98–111)
Creatinine, Ser: 0.74 mg/dL (ref 0.44–1.00)
GFR calc Af Amer: >60 mL/min (ref >60)
GFR calc non Af Amer: >60 mL/min (ref >60)
Glucose, Bld: 114 mg/dL — ABNORMAL HIGH (ref 70–99)
Potassium: 4 mmol/L (ref 3.5–5.1)
Sodium: 135 mmol/L (ref 135–145)

## 2018-05-16 LAB — HEPARIN LEVEL (UNFRACTIONATED): Heparin Unfractionated: 0.49 IU/mL (ref 0.30–0.70)

## 2018-05-16 MED ORDER — RIVAROXABAN 15 MG PO TABS
15.0000 mg | ORAL_TABLET | Freq: Two times a day (BID) | ORAL | Status: DC
  Administered 2018-05-16 – 2018-05-18 (×5): 15 mg via ORAL
  Filled 2018-05-16 (×5): qty 1

## 2018-05-16 MED ORDER — IBUPROFEN 600 MG PO TABS
600.0000 mg | ORAL_TABLET | Freq: Four times a day (QID) | ORAL | Status: DC | PRN
  Administered 2018-05-16 – 2018-05-18 (×5): 600 mg via ORAL
  Filled 2018-05-16 (×5): qty 1

## 2018-05-16 MED ORDER — RIVAROXABAN (XARELTO) VTE STARTER PACK (15 & 20 MG): ORAL_TABLET | ORAL | 0 refills | Status: DC

## 2018-05-16 MED ORDER — SODIUM CHLORIDE 0.9 % IV SOLN
INTRAVENOUS | Status: AC
  Administered 2018-05-16 (×2): via INTRAVENOUS

## 2018-05-16 MED ORDER — RIVAROXABAN 20 MG PO TABS: 20.0000 mg | ORAL_TABLET | Freq: Every day | ORAL | Status: DC

## 2018-05-16 NOTE — Progress Notes (Signed)
ANTICOAGULATION CONSULT NOTE - Follow Up Consult  Pharmacy Consult for Heparin Indication: pulmonary embolus  No Known Allergies  Patient Measurements: Height: 5\' 3"  (160 cm) Weight: 146 lb 12.8 oz (66.6 kg) IBW/kg (Calculated) : 52.4 Heparin Dosing Weight: 66.6 kg  Vital Signs: Temp: 98 F (36.7 C) (02/17 2000) Temp Source: Oral (02/17 2000) BP: 138/89 (02/17 2000) Pulse Rate: 86 (02/17 2000)  Labs: Recent Labs    05/14/18 0701 05/15/18 0216 05/15/18 1950 05/16/18 0322  HGB 8.9* 9.3*  --  8.8*  HCT 28.6* 30.0*  --  27.9*  PLT 423* 470*  --  454*  HEPARINUNFRC 0.58 0.48 <0.10* 0.49  CREATININE  --   --   --  0.74    Estimated Creatinine Clearance: 87.5 mL/min (by C-G formula based on SCr of 0.74 mg/dL).  Assessment:  38yo female c/o swelling to LLE since 1400 Sun followed by SOB and chest tightness, CT reveals bilateral PE w/ evidence of right heart strain. No evidence of right heart strain on echo 2/10. 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  Pharmacy consulted for IV heparin dosing. S/p Vascular - catheter-directed thrombectomy of left iliac vein Monday, 05/15/18. Restart heparin drip 4hr after sheath pulled - sheath out 10am, restart heparin at 2pm  PM heparin level < 0.10  Abnormal uterine bleeding, Megace begun 2/13 per OB/Gyn.   2/18 AM update: heparin level therapeutic x 1   Goal of Therapy:  Heparin level 0.3-0.7 units/ml Monitor platelets by anticoagulation protocol: Yes   Plan: Cont heparin at 1400 units/hr 1200 heparin level  Narda Bonds, PharmD, BCPS Clinical Pharmacist Phone: [phone]

## 2018-05-16 NOTE — Progress Notes (Signed)
PROGRESS NOTE    Holly Graham  IDP:[id number] DOB: 07-15-1980 DOA: 05/08/2018 PCP: Patient, No Pcp Per   Brief Narrative: 38 year old female no significant PMH with family history of DVT in brother, aunt presented to ER with progressive dyspnea, left leg swelling on 05/08/18.  Found to have acute PE with right heart strain CT DVT-Patient was started on IV heparin gtt.  Also with uterine fibroid on ultrasound/MRI.  Patient was seen by vascular surgery, OB/GYN.    Subjective: Complaints of pain on left flank.  Saturating well on room air, has chest pain on deep breath. Denies nausea, vomiting, no overt blood loss  Assessment & Plan:   Acute bilateral PE with cor pulmonale on the CT imaging : Hemodynamically stable.  On room air.  IR was consulted and thrombolytics were not indicated based on hemodynamics and no echo evidence of RV failure.Discussed Coumadin versus DOAC risks/benefits alternatives.Patient prefers to be on DOAC, understands reversal agent may need to be readily available. Encouarge ambulation. Add pr ibuprofen for chest pain. Check CXR for CP.  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  Popliteal axis stable, hemoglobin overall stable.  Discussed with Dr. Carlis Abbott - okay with DOAC,initiate  DOAC today monitor overnight and if stable plan on d/c home tomorrow.  Abnormal uterine bleeding in the setting of uterine fibroids and anticoagulant use. Seen by OB/GYN and started on Megace to help slow down the bleeding, advised follow-up as outpatient scheduled for 06/01/18  DVT prophylaxis: XARELTO Code Status: FULL Family Communication:  NONE AT BEDSIDE Disposition Plan: remains inpatient, possible discharge tomorrow if remains clinically stable.  Discussed with vascular surgery regarding plan  Consultants:  Vascular surgery Ob/Gyn IR  Procedures: 05/15/18 Left iliac system mechanical  thrombectomy  CT ANGIO CHEST  05/08/18 Positive for acute PE with CTevidence of right heart strain (RV/LV Ratio = 1.5) consistent with at least submassive (intermediate risk) PE. The presence of right heart strain has been associated with an increased risk of morbidity and mortality.  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 If surgical intervention is not planned, further evaluation with contrast-enhanced MRI of the pelvis may be considered  MRI pelvis w/ AND w/o 05/10/2018 1. Acute nonocclusive deep venous thrombosis in the left pelvis involving the left common and external iliac and left common femoral veins. Apparent focal narrowing of the left common iliac vein, which does not appear to correlate with extrinsic compression from the enlarged myomatous uterus. 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 2. Enlarged myomatous uterus as detailed. Please note that MRI can not reliably distinguish benign uterine fibroids from leiomyosarcoma. Although there are no overtly aggressive features associated with these uterine fibroids on this MRI study, clinical follow-up is necessary.   Antimicrobials: Anti-infectives (From admission, onward)   None     Objective: Vitals:   05/15/18 2000 05/16/18 0502 05/16/18 0952 05/16/18 0953  BP: 138/89 114/68 113/74   Pulse: 86 70 76 77  Resp: 19 19    Temp: 98 F (36.7 C) 98 F (36.7 C)    TempSrc: Oral Oral    SpO2: 100% 99%    Weight:      Height:        Intake/Output Summary (Last 24 hours) at 05/16/2018 1041 Last data filed at 05/16/2018 0835 Gross per 24 hour  Intake 756.56 ml  Output 1 ml  Net  755.56 ml   Filed Weights   05/08/18 0059 05/08/18 1109  Weight: 63.5 kg 66.6 kg   Weight change:   Body mass index is 26 kg/m.  Intake/Output from previous day: 02/17 0701 - 02/18 0700 In: 576.6 [P.O.:360; I.V.:216.6] Out: 1  [Urine:1] Intake/Output this shift: Total I/O In: 180 [P.O.:180] Out: -   Examination:  General exam: Appears to be in pain. NAD. On RA. HEENT:PERRL,Oral mucosa moist, Ear/Nose normal on gross exam Respiratory system: Bilateral equal air entry, normal vesicular breath sounds, no wheezes or crackles  Cardiovascular system: S1 & S2 heard,No JVD, murmurs. Gastrointestinal system: Abdomen is  soft, non tender, non distended, BS +  Nervous System:Alert and oriented. No focal neurological deficits/moving extremities, sensation intact. Extremities: No edema, no clubbing, distal peripheral pulses palpable. Skin: No rashes, lesions, no icterus.  Left popliteal vein access with dressing, no hematoma MSK: Normal muscle bulk,tone ,power  Medications:  Scheduled Meds: . aspirin EC  81 mg Oral Daily  . megestrol  80 mg Oral BID  . metoprolol tartrate  12.5 mg Oral BID  . multivitamin with minerals  1 tablet Oral Daily  . rivaroxaban  15 mg Oral BID   Followed by  . [START ON 06/06/2018] rivaroxaban  20 mg Oral Q supper  . sodium chloride flush  3 mL Intravenous Q12H   Continuous Infusions: . sodium chloride      Data Reviewed: I have personally reviewed following labs and imaging studies  CBC: Recent Labs  Lab 05/12/18 0237 05/13/18 0234 05/14/18 0701 05/15/18 0216 05/16/18 0322  WBC 9.0 10.0 10.0 11.0* 11.0*  HGB 9.2* 8.8* 8.9* 9.3* 8.8*  HCT 29.1* 28.6* 28.6* 30.0* 27.9*  MCV 88.2 86.9 86.9 85.5 85.6  PLT 355 363 423* 470* 944*   Basic Metabolic Panel: Recent Labs  Lab 05/10/18 0319 05/11/18 0244 05/16/18 0322  NA 135 137 135  K 4.3 4.1 4.0  CL 102 106 104  CO2 27 24 20*  GLUCOSE 108* 84 114*  BUN <5* <5* 6  CREATININE 0.69 0.76 0.74  CALCIUM 8.9 8.9 9.1   GFR: Estimated Creatinine Clearance: 87.5 mL/min (by C-G formula based on SCr of 0.74 mg/dL). Liver Function Tests: Recent Labs  Lab 05/10/18 0319  AST 38  ALT 16  ALKPHOS 88  BILITOT 0.8  PROT 6.3*   ALBUMIN 3.1*   No results for input(s): LIPASE, AMYLASE in the last 168 hours. No results for input(s): AMMONIA in the last 168 hours. Coagulation Profile: No results for input(s): INR, PROTIME in the last 168 hours. Cardiac Enzymes: No results for input(s): CKTOTAL, CKMB, CKMBINDEX, TROPONINI in the last 168 hours. BNP (last 3 results) No results for input(s): PROBNP in the last 8760 hours. HbA1C: No results for input(s): HGBA1C in the last 72 hours. CBG: No results for input(s): GLUCAP in the last 168 hours. Lipid Profile: No results for input(s): CHOL, HDL, LDLCALC, TRIG, CHOLHDL, LDLDIRECT in the last 72 hours. Thyroid Function Tests: No results for input(s): TSH, T4TOTAL, FREET4, T3FREE, THYROIDAB in the last 72 hours. Anemia Panel: No results for input(s): VITAMINB12, FOLATE, FERRITIN, TIBC, IRON, RETICCTPCT in the last 72 hours. Sepsis Labs: No results for input(s): PROCALCITON, LATICACIDVEN in the last 168 hours.  Recent Results (from the past 240 hour(s))  MRSA PCR Screening     Status: None   Collection Time: 05/09/18  9:14 AM  Result Value Ref Range Status   MRSA by PCR NEGATIVE NEGATIVE Final    Comment:  The GeneXpert MRSA Assay (FDA approved for NASAL specimens only), is one component of a comprehensive MRSA colonization surveillance program. It is not intended to diagnose MRSA infection nor to guide or monitor treatment for MRSA infections. Performed at [facility], [place] [address], [place], [place] [address]       Radiology Studies:    LOS: 8 days   Time spent: More than 50% of that time was spent in counseling and/or coordination of care.  Antonieta Pert, MD [facility]  05/16/2018, 10:41 AM

## 2018-05-16 NOTE — Discharge Instructions (Signed)
Information on my medicine - XARELTO (rivaroxaban)  This medication education was reviewed with me or my healthcare representative as part of my discharge preparation.  The pharmacist that spoke with me during my hospital stay was:  Einar Grad, [facility]  WHY WAS [place]? Xarelto was prescribed to treat blood clots that may have been found in the veins of your legs (deep vein thrombosis) or in your lungs (pulmonary embolism) and to reduce the risk of them occurring again.  What do you need to know about Xarelto? The starting dose is one 15 mg tablet taken TWICE daily with food for the FIRST 21 DAYS then on (enter date)  06/06/18  the dose is changed to one 20 mg tablet taken ONCE A DAY with your evening meal.  DO NOT stop taking Xarelto without talking to the health care provider who prescribed the medication.  Refill your prescription for 20 mg tablets before you run out.  After discharge, you should have regular check-up appointments with your healthcare provider that is prescribing your Xarelto.  In the future your dose may need to be changed if your kidney function changes by a significant amount.  What do you do if you miss a dose? If you are taking Xarelto TWICE DAILY and you miss a dose, take it as soon as you remember. You may take two 15 mg tablets (total 30 mg) at the same time then resume your regularly scheduled 15 mg twice daily the next day.  If you are taking Xarelto ONCE DAILY and you miss a dose, take it as soon as you remember on the same day then continue your regularly scheduled once daily regimen the next day. Do not take two doses of Xarelto at the same time.   Important Safety Information Xarelto is a blood thinner medicine that can cause bleeding. You should call your healthcare provider right away if you experience any of the following: ? Bleeding from an injury or your nose that does not stop. ? Unusual colored urine (red or dark brown) or  unusual colored stools (red or black). ? Unusual bruising for unknown reasons. ? A serious fall or if you hit your head (even if there is no bleeding).  Some medicines may interact with Xarelto and might increase your risk of bleeding while on Xarelto. To help avoid this, consult your healthcare provider or pharmacist prior to using any new prescription or non-prescription medications, including herbals, vitamins, non-steroidal anti-inflammatory drugs (NSAIDs) and supplements.  This website has more information on Xarelto: [email or web address]

## 2018-05-16 NOTE — Progress Notes (Signed)
[facility]   Subjective  - Left flank pain overnight.  Heparin restarted, left popliteal access looks ok.   Objective 114/68 70 98 F (36.7 C) (Oral) 19 99%  Intake/Output Summary (Last 24 hours) at 05/16/2018 0810 Last data filed at 05/16/2018 0300 Gross per 24 hour  Intake 576.56 ml  Output 1 ml  Net 575.56 ml    Left popliteal vein access no hematoma Left leg no marked edema or swelling  Laboratory Lab Results: Recent Labs    05/15/18 0216 05/16/18 0322  WBC 11.0* 11.0*  HGB 9.3* 8.8*  HCT 30.0* 27.9*  PLT 470* 454*   BMET Recent Labs    05/16/18 0322  NA 135  K 4.0  CL 104  CO2 20*  GLUCOSE 114*  BUN 6  CREATININE 0.74  CALCIUM 9.1    COAG No results found for: INR, PROTIME No results found for: PTT  Assessment/Planning:  POD#1 s/p mechanical thrombectomy left iliac system 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  Heparin restarted and popliteal access behind left knee looks ok and Hgb relatively stable.  Will need 6 months anticoagulation today.  Can transition to oral anticoagulant.  Will arrange follow-up in one month with iliac vein duplex.    Marty Heck 05/16/2018 8:10 AM --

## 2018-05-16 NOTE — Plan of Care (Signed)

## 2018-05-16 NOTE — Progress Notes (Signed)
Nutrition Follow-up  DOCUMENTATION CODES:   Not applicable  INTERVENTION:  Provide nourishment snacks. (RD has ordered).  Encourage adequate PO intake.   NUTRITION DIAGNOSIS:   Inadequate oral intake related to poor appetite, vomiting as evidenced by per patient/family report; ongoing  GOAL:   Patient will meet greater than or equal to 90% of their needs; progressing  MONITOR:   PO intake, Labs, Weight trends  REASON FOR ASSESSMENT:   Malnutrition Screening Tool    ASSESSMENT:   38 year old female who presented to the ED on 2/10 with SOB, chest pain, and leg swelling. No significant PMH. Pt admitted with acute PE. 2/17 Thrombectomy, stent of left common external iliac veins   Meal completion has been varied from 10-100%. Pt reports appetite has been fine, however has bouts of nausea and vomiting. Medication for nausea in place. Pt has been consuming his nourishment snacks of fresh fruit. Pt requesting sandwiches in addition to the fruit during snack. RD to order. Pt encouraged to eat her food at meals. Labs and medications reviewed.   Diet Order:   Diet Order            Diet regular Room service appropriate? Yes; Fluid consistency: Thin  Diet effective now              EDUCATION NEEDS:   Education needs have been addressed  Skin:  Skin Assessment: Skin Integrity Issues: Skin Integrity Issues:: Incisions Incisions: L knee  Last BM:  2/10  Height:   Ht Readings from Last 1 Encounters:  05/08/18 5\' 3"  (1.6 m)    Weight:   Wt Readings from Last 1 Encounters:  05/08/18 66.6 kg    Ideal Body Weight:  52.3 kg  BMI:  Body mass index is 26 kg/m.  Estimated Nutritional Needs:   Kcal:  1700-1900  Protein:  80-95 grams  Fluid:  1.7-1.9 L    Corrin Parker, MS, RD, LDN Pager # [phone] After hours/ weekend pager # [phone]

## 2018-05-16 NOTE — Progress Notes (Signed)
Patient complained of chest tightness, worse with inhale. BP 107/67 HR 81 oxygen 100% on room air. Patient is in NSR, without any telemetry changes. MD notified, will continue to monitor.

## 2018-05-16 NOTE — Progress Notes (Signed)
ANTICOAGULATION CONSULT NOTE - Follow Up Consult  Pharmacy Consult for Heparin Indication: pulmonary embolus  No Known Allergies  Patient Measurements: Height: 5\' 3"  (160 cm) Weight: 146 lb 12.8 oz (66.6 kg) IBW/kg (Calculated) : 52.4 Heparin Dosing Weight: 66.6 kg  Vital Signs: Temp: 98 F (36.7 C) (02/18 0502) Temp Source: Oral (02/18 0502) BP: 114/68 (02/18 0502) Pulse Rate: 70 (02/18 0502)  Labs: Recent Labs    05/14/18 0701 05/15/18 0216 05/15/18 1950 05/16/18 0322  HGB 8.9* 9.3*  --  8.8*  HCT 28.6* 30.0*  --  27.9*  PLT 423* 470*  --  454*  HEPARINUNFRC 0.58 0.48 <0.10* 0.49  CREATININE  --   --   --  0.74    Estimated Creatinine Clearance: 87.5 mL/min (by C-G formula based on SCr of 0.74 mg/dL).  Assessment: 38yo female c/o swelling to LLE and SOB found to have bilateral PE. Pt also noted to have L iliac vein thrombus s/p thrombectomy on 2/17. Pt has been maintained on IV heparin, now to transition to Xarelto.    Goal of Therapy:  Heparin level 0.3-0.7 units/ml Monitor platelets by anticoagulation protocol: Yes   Plan: -Stop heparin -Initiate Xarelto 15mg  BID x21 days until 3/10 then start 20mg  daily with supper  Arrie Senate, PharmD, BCPS Clinical Pharmacist [phone] Please check AMION for all [place] numbers 05/16/2018

## 2018-05-17 DIAGNOSIS — N939 Abnormal uterine and vaginal bleeding, unspecified: Secondary | ICD-10-CM

## 2018-05-17 DIAGNOSIS — R079 Chest pain, unspecified: Secondary | ICD-10-CM

## 2018-05-17 LAB — URINALYSIS, ROUTINE W REFLEX MICROSCOPIC
Bacteria, UA: NONE SEEN
Bilirubin Urine: NEGATIVE
Glucose, UA: NEGATIVE mg/dL
Ketones, ur: NEGATIVE mg/dL
Leukocytes,Ua: NEGATIVE
Nitrite: NEGATIVE
Protein, ur: NEGATIVE mg/dL
Specific Gravity, Urine: 1.005 (ref 1.005–1.030)
pH: 6 (ref 5.0–8.0)

## 2018-05-17 LAB — PREPARE RBC (CROSSMATCH)

## 2018-05-17 LAB — CBC
HCT: 24.3 % — ABNORMAL LOW (ref 36.0–46.0)
Hemoglobin: 7.7 g/dL — ABNORMAL LOW (ref 12.0–15.0)
MCH: 27.8 pg (ref 26.0–34.0)
MCHC: 31.7 g/dL (ref 30.0–36.0)
MCV: 87.7 fL (ref 80.0–100.0)
Platelets: 440 10*3/uL — ABNORMAL HIGH (ref 150–400)
RBC: 2.77 MIL/uL — ABNORMAL LOW (ref 3.87–5.11)
RDW: 18 % — AB (ref 11.5–15.5)
WBC: 10.8 10*3/uL — ABNORMAL HIGH (ref 4.0–10.5)
nRBC: 0 % (ref 0.0–0.2)

## 2018-05-17 LAB — ABO/RH: ABO/RH(D): B POS

## 2018-05-17 MED ORDER — BISACODYL 5 MG PO TBEC: 5.0000 mg | DELAYED_RELEASE_TABLET | Freq: Every day | ORAL | Status: DC | PRN

## 2018-05-17 MED ORDER — SENNOSIDES-DOCUSATE SODIUM 8.6-50 MG PO TABS
2.0000 | ORAL_TABLET | Freq: Two times a day (BID) | ORAL | Status: DC
  Administered 2018-05-17 – 2018-05-18 (×3): 2 via ORAL
  Filled 2018-05-17 (×3): qty 2

## 2018-05-17 MED ORDER — SODIUM CHLORIDE 0.9% IV SOLUTION
Freq: Once | INTRAVENOUS | Status: AC
  Administered 2018-05-17: 14:00:00 via INTRAVENOUS

## 2018-05-17 MED ORDER — SODIUM CHLORIDE 0.9% IV SOLUTION: Freq: Once | INTRAVENOUS | Status: DC

## 2018-05-17 MED ORDER — POLYETHYLENE GLYCOL 3350 17 G PO PACK
17.0000 g | PACK | Freq: Every day | ORAL | Status: DC
  Administered 2018-05-17: 17 g via ORAL
  Filled 2018-05-17: qty 1

## 2018-05-17 NOTE — Plan of Care (Signed)
Care plans reviewed and patient is progressing.  

## 2018-05-17 NOTE — Progress Notes (Addendum)
[facility] [place]  Subjective  - Compliant of burning with urination , abdominal pain, and mild right thigh pain.   Objective 121/75 80 98.4 F (36.9 C) (Oral) 14 100%  Intake/Output Summary (Last 24 hours) at 05/17/2018 0743 Last data filed at 05/16/2018 1500 Gross per 24 hour  Intake 451.24 ml  Output -  Net 451.24 ml    Palpable DP B , left LE edema almost absent when compared to right LE. Left popliteal stick site soft.  Abdominal discomfort since yesterday, soft to palpation with mild discomfort. 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  Will need 6 months anticoagulation today.  Can transition to oral anticoagulant.  Dr. Carlis Abbott will arrange follow-up in one month with iliac vein duplex.   Will order UA and dulcolax PRN no BM for 8 days.   Roxy Horseman 05/17/2018 7:43 AM --  Laboratory Lab Results: Recent Labs    05/16/18 0322 05/17/18 0252  WBC 11.0* 10.8*  HGB 8.8* 7.7*  HCT 27.9* 24.3*  PLT 454* 440*   BMET Recent Labs    05/16/18 0322  NA 135  K 4.0  CL 104  CO2 20*  GLUCOSE 114*  BUN 6  CREATININE 0.74  CALCIUM 9.1    COAG No results found for: INR, PROTIME No results found for: PTT  I have seen and evaluated the patient. I agree with the PA note as documented above. Hgb 8.8 --> 7.7 today and getting 1 upRBC.  Will monitor response.  Leg feels ok since left iliac vein intervention.  Having a lot of supra-pubic pain.  Has large fibroid that ob-gyn has seen.  Will monitor response to transfusion.  Marty Heck, MD [facility] [place] Office: [phone] Pager: [phone]

## 2018-05-17 NOTE — Progress Notes (Signed)
PROGRESS NOTE  Holly Graham GEX:[id number] DOB: [birth year] DOA: 05/08/2018 PCP: Patient, No Pcp Per   LOS: 9 days   Brief Narrative / Interim history: 38 year old female no significant PMH with family history of DVT in brother, aunt presented to ER with progressive dyspnea, left leg swelling on 05/08/18.  Found to have acute PE with right heart strain CT DVT-Patient was started on IV heparin gtt.  Also with uterine fibroid on ultrasound/MRI.  Patient was seen by vascular surgery, OB/GYN.    Subjective: -Complains of lower abdominal cramping, also with shortness of breath, also has chest pain with deep breathing.   Assessment & Plan: Principal Problem:   Bilateral pulmonary embolism (HCC) Active Problems:   Hypokalemia   Abnormal uterine bleeding (AUB)   Fibroids   Principal Problem Acute bilateral PE with cor pulmonale on CT imaging -Hemodynamically stable, she is on room air.  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 sodium chloride   Intravenous Once  . sodium chloride   Intravenous Once  . aspirin EC  81 mg Oral Daily  . megestrol  80 mg Oral BID  . metoprolol tartrate  12.5 mg Oral BID  . multivitamin with minerals  1 tablet Oral Daily    . polyethylene glycol  17 g Oral Daily  . rivaroxaban  15 mg Oral BID   Followed by  . [START ON 06/06/2018] rivaroxaban  20 mg Oral Q supper  . senna-docusate  2 tablet Oral BID  . sodium chloride flush  3 mL Intravenous Q12H   Continuous Infusions: . sodium chloride     PRN Meds:.sodium chloride, acetaminophen, acetaminophen, hydrALAZINE, hydrALAZINE, ibuprofen, labetalol, ondansetron **OR** ondansetron (ZOFRAN) IV, oxyCODONE, sodium chloride flush, traMADol  DVT prophylaxis: Xarelto Code Status: Full code Family Communication: No family at bedside Disposition Plan: Possibly home in 1 day  Consultants:   Vascular surgery  Procedures:  Left iliac system mechanical thrombectomy on 2/17  Antimicrobials:  None  Objective: Vitals:   05/16/18 1307 05/16/18 1938 05/17/18 0600 05/17/18 1107  BP: 107/67 124/85 121/75 109/65  Pulse: 81 91 80 88  Resp: 16 19 14    Temp:  98.3 F (36.8 C) 98.4 F (36.9 C)   TempSrc:  Oral Oral   SpO2: 100% 100% 100%   Weight:      Height:        Intake/Output Summary (Last 24 hours) at 05/17/2018 1223 Last data filed at 05/17/2018 0843 Gross per 24 hour  Intake 372.97 ml  Output -  Net 372.97 ml   Filed Weights   05/08/18 0059 05/08/18 1109  Weight: 63.5 kg 66.6 kg    Examination:  Constitutional: NAD Eyes: lids and conjunctivae normal ENMT: Mucous membranes are moist. No oropharyngeal exudates Respiratory: clear to auscultation bilaterally, no wheezing, no crackles.  Cardiovascular: Regular rate and rhythm, no murmurs / rubs / gallops. No LE edema. Abdomen: no tenderness. Bowel sounds positive.  Musculoskeletal: no clubbing / cyanosis.  Skin: no rashes Neurologic: no focal deficits Psychiatric: Normal judgment and insight. Alert and oriented x 3. Normal mood.    Data Reviewed: I have independently reviewed following labs and imaging studies   CBC: Recent Labs  Lab 05/13/18 0234 05/14/18 0701 05/15/18 0216  05/16/18 0322 05/17/18 0252  WBC 10.0 10.0 11.0* 11.0* 10.8*  HGB 8.8* 8.9* 9.3* 8.8* 7.7*  HCT 28.6* 28.6* 30.0* 27.9* 24.3*  MCV 86.9 86.9 85.5 85.6 87.7  PLT 363 423* 470* 454* 494*   Basic Metabolic Panel: Recent Labs  Lab 05/11/18 0244 05/16/18 0322  NA 137 135  K 4.1 4.0  CL 106 104  CO2 24 20*  GLUCOSE 84 114*  BUN <5* 6  CREATININE 0.76 0.74  CALCIUM 8.9 9.1   GFR: Estimated Creatinine Clearance: 87.5 mL/min (by C-G formula based on SCr of 0.74 mg/dL). Liver Function Tests: No results for input(s): AST, ALT, ALKPHOS, BILITOT, PROT, ALBUMIN in the last 168 hours. No results for input(s): LIPASE, AMYLASE in the last 168 hours. No results for input(s): AMMONIA in the last 168 hours. Coagulation Profile: No results for input(s): INR, PROTIME in the last 168 hours. Cardiac Enzymes: No results for input(s): CKTOTAL, CKMB, CKMBINDEX, TROPONINI in the last 168 hours. BNP (last 3 results) No results for input(s): PROBNP in the last 8760 hours. HbA1C: No results for input(s): HGBA1C in the last 72 hours. CBG: No results for input(s): GLUCAP in the last 168 hours. Lipid Profile: No results for input(s): CHOL, HDL, LDLCALC, TRIG, CHOLHDL, LDLDIRECT in the last 72 hours. Thyroid Function Tests: No results for input(s): TSH, T4TOTAL, FREET4, T3FREE, THYROIDAB in the last 72 hours. Anemia Panel: No results for input(s): VITAMINB12, FOLATE, FERRITIN, TIBC, IRON, RETICCTPCT in the last 72 hours. Urine analysis: No results found for: COLORURINE, APPEARANCEUR, LABSPEC, PHURINE, GLUCOSEU, HGBUR, BILIRUBINUR, KETONESUR, PROTEINUR, UROBILINOGEN, NITRITE, LEUKOCYTESUR Sepsis Labs: Invalid input(s): PROCALCITONIN, LACTICIDVEN  Recent Results (from the past 240 hour(s))  MRSA PCR Screening     Status: None   Collection Time: 05/09/18  9:14 AM  Result Value Ref Range Status   MRSA by PCR NEGATIVE NEGATIVE Final    Comment:        The GeneXpert MRSA Assay (FDA approved for NASAL  specimens only), is one component of a comprehensive MRSA colonization surveillance program. It is not intended to diagnose MRSA infection nor to guide or monitor treatment for MRSA infections. Performed at [facility], [place] [address] [place], [place] [address]       Radiology Studies: Dg Chest Port 1 View  Result Date: 05/16/2018 CLINICAL DATA:  38 y/o  F; chest pain. EXAM: PORTABLE CHEST 1 VIEW COMPARISON:  05/08/2018 chest radiograph and CT chest. FINDINGS: Stable heart size and mediastinal contours are within normal limits. Both lungs are clear. The visualized skeletal structures are unremarkable. IMPRESSION: Clear lungs. Electronically Signed   By: Kristine Garbe M.D.   On: 05/16/2018 14:19    Marzetta Board, MD, PhD [facility]  Contact via  [email or web address]  [place] P: [phone]  F: [phone]

## 2018-05-18 LAB — TYPE AND SCREEN
ABO/RH(D): B POS
Antibody Screen: NEGATIVE
Unit division: 0

## 2018-05-18 LAB — BPAM RBC
Blood Product Expiration Date: 202003012359
ISSUE DATE / TIME: 202002191330
UNIT TYPE AND RH: 7300

## 2018-05-18 LAB — CBC
HEMATOCRIT: 28.5 % — AB (ref 36.0–46.0)
Hemoglobin: 9.2 g/dL — ABNORMAL LOW (ref 12.0–15.0)
MCH: 27.5 pg (ref 26.0–34.0)
MCHC: 32.3 g/dL (ref 30.0–36.0)
MCV: 85.3 fL (ref 80.0–100.0)
Platelets: 520 10*3/uL — ABNORMAL HIGH (ref 150–400)
RBC: 3.34 MIL/uL — ABNORMAL LOW (ref 3.87–5.11)
RDW: 17.5 % — ABNORMAL HIGH (ref 11.5–15.5)
WBC: 12.7 10*3/uL — ABNORMAL HIGH (ref 4.0–10.5)
nRBC: 0 % (ref 0.0–0.2)

## 2018-05-18 MED ORDER — METOPROLOL TARTRATE 25 MG PO TABS: 12.5000 mg | ORAL_TABLET | Freq: Two times a day (BID) | ORAL | 1 refills | Status: DC

## 2018-05-18 MED ORDER — MEGESTROL ACETATE 40 MG PO TABS: 80.0000 mg | ORAL_TABLET | Freq: Two times a day (BID) | ORAL | 1 refills | Status: DC

## 2018-05-18 MED ORDER — OXYCODONE HCL 5 MG PO TABS: 5.0000 mg | ORAL_TABLET | Freq: Four times a day (QID) | ORAL | 0 refills | Status: AC | PRN

## 2018-05-18 MED ORDER — OXYCODONE HCL 5 MG PO TABS: 5.0000 mg | ORAL_TABLET | Freq: Four times a day (QID) | ORAL | 0 refills | Status: DC | PRN

## 2018-05-18 NOTE — Discharge Summary (Signed)
Physician Discharge Summary  Holly Graham MEQ:[id number] DOB: [birth year] DOA: 05/08/2018  PCP: Patient, No Pcp Per  Admit date: 05/08/2018 Discharge date: 05/18/2018  Admitted From: home Disposition:  home  Recommendations for Outpatient Follow-up:  1. Follow up with PCP in 1-2 weeks  Home Health: none Equipment/Devices: none  Discharge Condition: stable CODE STATUS: Full code Diet recommendation: regular  HPI: Per admitting MD, Holly Graham is a 38 y.o. female with medical history significant of previously healthy.  Patient presents to the ED with worsening SOB, onset some 3-4 weeks ago.  No CP, no pain with breathing, no cough, no fever, no N/V.  No h/o asthma, non-smoker. Today she noticed left upper leg began to swell, this extended down leg.  No leg pain.  This prompted her to come to ED. Not on OCP.  Does have family h/o PE in her brother. ED Course: D.Dimer is positive.  CTA confirms PE with R heart strain.  HR 130, RR 30.  BP 150/100.  AG 17.  Trop neg.  Hospital Course: Principal Problem Acute bilateral PE with cor pulmonale on CT imaging -patient was admitted to the hospital and placed on anticoagulation.  Interventional radiology was consulted and she is on the thrombolytics based on hemodynamics and without any echo evidence of RV failure.  She was later transitioned to Xarelto, she tolerates this well without difficulties, she is on room air, hemodynamically stable, and will be discharged home in stable condition.  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 Continue Xarelto Abnormal uterine bleeding in the setting of uterine fibroids and anticoagulant use -Seen by OB/GYN while hospitalized ago and started on Megace, also has an outpatient appointment.  Acute blood loss anemia -Likely in the setting of abnormal uterine bleeding, hemoglobin slowly trending  down in the 7 range today, she was transfused a unit of packed red blood cells with appropriate increase.  Recommend further monitoring as an outpatient.   Discharge Diagnoses:  Principal Problem:   Bilateral pulmonary embolism (HCC) Active Problems:   Hypokalemia   Abnormal uterine bleeding (AUB)   Fibroids     Discharge Instructions   Allergies as of 05/18/2018   No Known Allergies     Medication List    TAKE these medications   megestrol 40 MG tablet Commonly known as:  MEGACE Take 2 tablets (80 mg total) by mouth 2 (two) times daily.   metoprolol tartrate 25 MG tablet Commonly known as:  LOPRESSOR Take 0.5 tablets (12.5 mg total) by mouth 2 (two) times daily.   oxyCODONE 5 MG immediate release tablet Commonly known as:  Oxy IR/ROXICODONE Take 1-2 tablets (5-10 mg total) by mouth every 6 (six) hours as needed for up to 3 days for severe pain.   Rivaroxaban 15 & 20 MG Tbpk Take as directed on package-Start with one 15mg  tablet by mouth twice a day with food. On Day 22, switch to one 20mg    daily with food.      Follow-up Information    [facility] Follow up on 05/31/2018.   Why:  2:30 for hospital follow up Contact information: [place] [address] [facility] up.   Why:  you can use the pharmacy here for discounts on medications Contact information: [place] [address]  [phone]       [facility] Follow up on 06/01/2018.   Specialty:  Obstetrics and Gynecology Why:  1:55 pm for follow up appointment Contact information: [place] [phone]       Heath Lark, MD Follow up.   Specialty:  Hematology and Oncology Why:  call to establish care with hematology Contact information: [place] [address] [phone]           Consultations:  OB/GYN  Vascular surgery  Procedures/Studies:  2D echo  IMPRESSIONS   1. The left ventricle has normal systolic function of 96-75%. The cavity size was normal. There is no increased left ventricular wall thickness. Echo evidence of impaired diastolic relaxation.  2. The right ventricle has normal systolic function. The cavity was normal. There is no increase in right ventricular wall thickness.  3. The mitral valve is normal in structure. There is mild thickening.  4. The tricuspid valve is normal in structure. Tricuspid valve regurgitation is moderate.  5. The aortic valve is normal in structure.  6. The pulmonic valve was normal in structure.  7. The inferior vena cava was normal in size with <50% respiratory variability.   Left iliac system mechanical thrombectomy on 2/17  Dg Chest 2 View  Result Date: 05/08/2018 CLINICAL DATA:  Chest pain, shortness of Breath EXAM: CHEST - 2 VIEW COMPARISON:  04/25/2018 FINDINGS: Heart and mediastinal contours are within normal limits. No focal opacities or effusions. No acute bony abnormality. IMPRESSION: No active cardiopulmonary disease. Electronically Signed   By: Rolm Baptise M.D.   On: 05/08/2018 01:24   Dg Chest 2 View  Result Date: 04/25/2018 CLINICAL DATA:  Nausea and fatigue for 8 months. EXAM: CHEST - 2 VIEW COMPARISON:  None. FINDINGS: The heart size and mediastinal contours are within normal limits. Both lungs are clear. The visualized skeletal structures are unremarkable. IMPRESSION: No active cardiopulmonary disease. Electronically Signed   By: Ashley Royalty M.D.   On: 04/25/2018 01:02   Ct Angio Chest Pe W And/or Wo Contrast  Result Date: 05/08/2018 CLINICAL DATA:  38 y/o F; swelling of left leg with no known injury. Shortness of breath and chest tightness. EXAM: CT ANGIOGRAPHY CHEST WITH CONTRAST TECHNIQUE: Multidetector CT imaging of the chest was performed using the  standard protocol during bolus administration of intravenous contrast. Multiplanar CT image reconstructions and MIPs were obtained to evaluate the vascular anatomy. CONTRAST:  55 cc Isovue 370 COMPARISON:  05/08/2018 chest radiograph. FINDINGS: Cardiovascular: Acute right lobar and bilateral segmental pulmonary emboli. RV/LV = 1.5. Normal caliber thoracic aorta. Mediastinum/Nodes: No enlarged mediastinal, hilar, or axillary lymph nodes. Thyroid gland, trachea, and esophagus demonstrate no significant findings. Lungs/Pleura: Lungs are clear. No pleural effusion or pneumothorax. Upper Abdomen: No acute abnormality. Musculoskeletal: No chest wall abnormality. No acute or significant osseous findings. Review of the MIP images confirms the above findings. IMPRESSION: Positive for acute PE with CTevidence of right heart strain (RV/LV Ratio = 1.5) consistent with at least submassive (intermediate risk) PE. The presence of right heart strain has been associated with an increased risk of morbidity and mortality. Critical Value/emergent results were called by telephone at the time of interpretation on 05/08/2018 at 4:29 am to Dr. Pryor Curia , who verbally acknowledged these results. Electronically Signed   By: Kristine Garbe M.D.   On: 05/08/2018 04:30   Mr Pelvis W FF Contrast  Result Date: 05/10/2018 CLINICAL DATA:  38 year old  female inpatient admitted with pulmonary embolism and enlarged myomatous uterus. History of tubal ligation. EXAM: MRI PELVIS WITHOUT AND WITH CONTRAST TECHNIQUE: Multiplanar multisequence MR imaging of the pelvis was performed both before and after administration of intravenous contrast. CONTRAST:  6 cc Gadavist IV COMPARISON:  05/08/2018 pelvic sonogram. FINDINGS: Urinary Tract:  Normal nondistended bladder.  Normal urethra. Bowel: Visualized small and large bowel are normal caliber with no bowel wall thickening. Vascular/Lymphatic: No pathologically enlarged lymph nodes in the  pelvis. Small nonocclusive thrombus in the left common femoral vein (series 10/image 24). Nonocclusive tubular acute thrombi throughout the left common and external iliac veins. Apparent focal narrowing of the left common iliac vein (series 10/image 4), although this does not correlate with extrinsic compression from the enlarged myomatous uterus on these images. Asymmetric edema surrounding the length of the left common and external iliac veins. No evidence of right pelvic deep venous thrombosis. Reproductive: Uterus: The enlarged myomatous uterus measures 16.0 x 10.2 x 11.5 cm (volume = 983 cm^3). There are multiple enhancing uterine fibroids (greater than 10), with representative fibroids as follows: -right fundal intramural 6.7 x 4.8 x 4.9 cm (volume = 83 cm^3) fibroid -left uterine body submucosal 1.8 x 1.3 x 1.2 cm (volume = 1.5 cm^3) fibroid -posterior left upper uterine body 4.7 x 3.8 x 3.3 cm (volume = 31 cm^3) fibroid with approximately 40% submucosal component A few of the fibroids demonstrate heterogeneous T2 hyperintensity indicative of partial cystic degeneration. No intracavitary or pedunculated fibroids are evident. Inner myometrium (junctional zone) is poorly delineated and measures approximately 7 mm in thickness, which is within normal limits. Endometrium is distorted by surrounding fibroids and measures approximately 6 mm in bilayer thickness, which is within normal limits. No endometrial cavity fluid or focal endometrial mass. Scattered small nabothian cysts in the cervix. Fibrous cervical stroma is intact. Ovaries and Adnexa: The right ovary measures 5.8 x 3.5 x 5.2 cm and is normal with several simple follicular cysts measuring up to the 3.2 cm. The left ovary measures 4.5 x 3.0 x 3.1 cm and is normal with a small hemorrhagic corpus luteum (series 4/image 10). There are no suspicious ovarian or adnexal masses. Other: No abnormal free fluid in the pelvis. No focal pelvic fluid collection.  Musculoskeletal: No aggressive appearing focal osseous lesions. IMPRESSION: 1. Acute nonocclusive deep venous thrombosis in the left pelvis involving the left common and external iliac and left common femoral veins. Apparent focal narrowing of the left common iliac vein, which does not appear to correlate with extrinsic compression from the enlarged myomatous uterus. 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 2. Enlarged myomatous uterus as detailed. Please note that MRI can not reliably distinguish benign uterine fibroids from leiomyosarcoma. Although there are no overtly aggressive features associated with these uterine fibroids on this MRI study, clinical follow-up is necessary. 3. No ovarian or adnexal abnormality. Electronically Signed   By: Ilona Sorrel M.D.   On: 05/10/2018 20:57   US Pelvis (transabdominal Only)  Result Date: 05/08/2018 CLINICAL DATA:  Clinical concern for a large fibroid. EXAM: TRANSABDOMINAL ULTRASOUND OF PELVIS TECHNIQUE: Transabdominal ultrasound examination of the pelvis was performed including evaluation of the uterus, ovaries, adnexal regions, and pelvic cul-de-sac. COMPARISON:  None. FINDINGS: Uterus Measurements: 13.4 x 8.5 x 10.1 cm = volume: 602 mL. There is a large intramural myometrial mass within the fundus of the uterus measuring 9.5 by 11.2 by 8.2 cm. Endometrium The endometrium is poorly visualized. Right ovary Measurements: 5.5 x 5.2 x 3.3 cm = volume:  49.3 mL. Several benign-appearing cysts, the largest measuring 2.7 cm. Left ovary Measurements: 3.6 x 4.3 x 2.6 cm = volume: 21 mL. Normal appearance/no adnexal mass. Other findings:  No abnormal free fluid. IMPRESSION: Enlarged uterus containing a large myometrial mass within the fundus which measures 11.2 cm in greatest dimension. If surgical intervention is not planned, further evaluation with contrast-enhanced MRI of the pelvis may be considered. Normal appearance of the ovaries. Electronically Signed   By: Fidela Salisbury  M.D.   On: 05/08/2018 22:23   Dg Chest Port 1 View  Result Date: 05/16/2018 CLINICAL DATA:  38 y/o  F; chest pain. EXAM: PORTABLE CHEST 1 VIEW COMPARISON:  05/08/2018 chest radiograph and CT chest. FINDINGS: Stable heart size and mediastinal contours are within normal limits. Both lungs are clear. The visualized skeletal structures are unremarkable. IMPRESSION: Clear lungs. Electronically Signed   By: Kristine Garbe M.D.   On: 05/16/2018 14:19   Vas [place] Lower Extremity Venous (dvt)  Result Date: 05/08/2018  Lower Venous Study Risk Factors: Confirmed PE. Comparison Study: No prior study on file for comparison Performing Technologist: Sharion Dove RVS Supporting Technologist: June Leap RDMS, RVT  Examination Guidelines: A complete evaluation includes B-mode imaging, spectral Doppler, color Doppler, and power Doppler as needed of all accessible portions of each vessel. Bilateral testing is considered an integral part of a complete examination. Limited examinations for reoccurring indications may be performed as noted.  Right Venous Findings: +---------+---------------+---------+-----------+----------+-------+          CompressibilityPhasicitySpontaneityPropertiesSummary +---------+---------------+---------+-----------+----------+-------+ CFV      Full           Yes      Yes                          +---------+---------------+---------+-----------+----------+-------+ SFJ      Full                                                 +---------+---------------+---------+-----------+----------+-------+ FV Prox  Full                                                 +---------+---------------+---------+-----------+----------+-------+ FV Mid   Full                                                 +---------+---------------+---------+-----------+----------+-------+ FV DistalFull                                                  +---------+---------------+---------+-----------+----------+-------+ PFV      Full                                                 +---------+---------------+---------+-----------+----------+-------+ POP      Full  Yes      Yes                          +---------+---------------+---------+-----------+----------+-------+ PTV      Full                                                 +---------+---------------+---------+-----------+----------+-------+ PERO     Full                                                 +---------+---------------+---------+-----------+----------+-------+  Left Venous Findings: +---------+---------------+---------+-----------+----------+-------+          CompressibilityPhasicitySpontaneityPropertiesSummary +---------+---------------+---------+-----------+----------+-------+ CFV      Full           No       No                           +---------+---------------+---------+-----------+----------+-------+ SFJ      Full           No       No                           +---------+---------------+---------+-----------+----------+-------+ FV Prox  Full                                                 +---------+---------------+---------+-----------+----------+-------+ FV Mid   Full                                                 +---------+---------------+---------+-----------+----------+-------+ FV DistalFull                                                 +---------+---------------+---------+-----------+----------+-------+ PFV      Full           No       No                           +---------+---------------+---------+-----------+----------+-------+ POP      Full           No       No                           +---------+---------------+---------+-----------+----------+-------+ PTV      Full                                                  +---------+---------------+---------+-----------+----------+-------+ PERO     Full                                                 +---------+---------------+---------+-----------+----------+-------+  iliac    None           No       No                   Acute   +---------+---------------+---------+-----------+----------+-------+ Continuous waveforms throughout with no evidence of lower extremity DVT    Summary: Right: There is no evidence of deep vein thrombosis in the lower extremity. Left: There is no evidence of deep vein thrombosis in the lower extremity. Continuous waveforms throughout with no evidence of lower extremity DVT. Thrombus noted in the left iliac but does not extend into the IVC  *See table(s) above for measurements and observations. Electronically signed by Ruta Hinds MD on 05/08/2018 at 5:17:53 PM.    Final       Subjective: - no chest pain, shortness of breath, no abdominal pain, nausea or vomiting.   Discharge Exam: Vitals:   05/18/18 0500 05/18/18 0848  BP: 103/60 120/70  Pulse: 70 74  Resp: 20 17  Temp: 98.7 F (37.1 C) 98.2 F (36.8 C)  SpO2: 100% 100%    General: Pt is alert, awake, not in acute distress Cardiovascular: RRR, S1/S2 +, no rubs, no gallops Respiratory: CTA bilaterally, no wheezing, no rhonchi Abdominal: Soft, NT, ND, bowel sounds + Extremities: no edema, no cyanosis   The results of significant diagnostics from this hospitalization (including imaging, microbiology, ancillary and laboratory) are listed below for reference.     Microbiology: Recent Results (from the past 240 hour(s))  MRSA PCR Screening     Status: None   Collection Time: 05/09/18  9:14 AM  Result Value Ref Range Status   MRSA by PCR NEGATIVE NEGATIVE Final    Comment:        The GeneXpert MRSA Assay (FDA approved for NASAL specimens only), is one component of a comprehensive MRSA colonization surveillance program. It is not intended to diagnose  MRSA infection nor to guide or monitor treatment for MRSA infections. Performed at [facility], [place] [address] [place], [place] [address]      Labs: BNP (last 3 results) No results for input(s): BNP in the last 8760 hours. Basic Metabolic Panel: Recent Labs  Lab 05/16/18 0322  NA 135  K 4.0  CL 104  CO2 20*  GLUCOSE 114*  BUN 6  CREATININE 0.74  CALCIUM 9.1   Liver Function Tests: No results for input(s): AST, ALT, ALKPHOS, BILITOT, PROT, ALBUMIN in the last 168 hours. No results for input(s): LIPASE, AMYLASE in the last 168 hours. No results for input(s): AMMONIA in the last 168 hours. CBC: Recent Labs  Lab 05/14/18 0701 05/15/18 0216 05/16/18 0322 05/17/18 0252 05/18/18 0256  WBC 10.0 11.0* 11.0* 10.8* 12.7*  HGB 8.9* 9.3* 8.8* 7.7* 9.2*  HCT 28.6* 30.0* 27.9* 24.3* 28.5*  MCV 86.9 85.5 85.6 87.7 85.3  PLT 423* 470* 454* 440* 520*   Cardiac Enzymes: No results for input(s): CKTOTAL, CKMB, CKMBINDEX, TROPONINI in the last 168 hours. BNP: Invalid input(s): POCBNP CBG: No results for input(s): GLUCAP in the last 168 hours. D-Dimer No results for input(s): DDIMER in the last 72 hours. Hgb A1c No results for input(s): HGBA1C in the last 72 hours. Lipid Profile No results for input(s): CHOL, HDL, LDLCALC, TRIG, CHOLHDL, LDLDIRECT in the last 72 hours. Thyroid function studies No results for input(s): TSH, T4TOTAL, T3FREE, THYROIDAB in the last 72 hours.  Invalid input(s): FREET3 Anemia work up No results for input(s): VITAMINB12, FOLATE, FERRITIN,  TIBC, IRON, RETICCTPCT in the last 72 hours. Urinalysis    Component Value Date/Time   COLORURINE STRAW (A) 05/17/2018 1236   APPEARANCEUR CLEAR 05/17/2018 1236   LABSPEC 1.005 05/17/2018 1236   PHURINE 6.0 05/17/2018 1236   GLUCOSEU NEGATIVE 05/17/2018 1236   HGBUR SMALL (A) 05/17/2018 1236   BILIRUBINUR NEGATIVE 05/17/2018 1236   KETONESUR NEGATIVE 05/17/2018 1236   PROTEINUR NEGATIVE 05/17/2018  1236   NITRITE NEGATIVE 05/17/2018 1236   LEUKOCYTESUR NEGATIVE 05/17/2018 1236   Sepsis Labs Invalid input(s): PROCALCITONIN,  WBC,  LACTICIDVEN   Time coordinating discharge: 35 minutes  SIGNED:  Marzetta Board, MD  [facility] 05/18/2018, 2:07 PM

## 2018-05-18 NOTE — Progress Notes (Signed)
Spoke with Nicole Kindred at [facility]- they will plan to fill pt's d/c medications under [facility] program and deliver to bedside prior to discharge.

## 2018-05-18 NOTE — Progress Notes (Signed)
[facility]  Subjective  - Still having supra-pubic pain.  1upRBC transfusion yesterday.   Objective 103/60 70 98.7 F (37.1 C) (Oral) 20 100%  Intake/Output Summary (Last 24 hours) at 05/18/2018 0729 Last data filed at 05/17/2018 1800 Gross per 24 hour  Intake 1735 ml  Output 490 ml  Net 1245 ml    Palpable DP B , left LE edema almost absent when compared to right LE. Left popliteal stick site soft.  Abdominal discomfort since yesterday, soft to palpation with mild discomfort. Heart RRR Lungs non labored breathing Left groin c/d/i  Assessment/Planning: POD s/p mechanical thrombectomy left iliac system 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  Will need 6 months anticoagulation with DOAC.  Responded to 1 upRBC yesterday.    Can be discharged from our standpoint.  Will arrange follow-up in one month with iliac vein duplex.    Marty Heck 05/18/2018 7:29 AM --  Laboratory Lab Results: Recent Labs    05/17/18 0252 05/18/18 0256  WBC 10.8* 12.7*  HGB 7.7* 9.2*  HCT 24.3* 28.5*  PLT 440* 520*   BMET Recent Labs    05/16/18 0322  NA 135  K 4.0  CL 104  CO2 20*  GLUCOSE 114*  BUN 6  CREATININE 0.74  CALCIUM 9.1    COAG No results found for: INR, PROTIME No results found for: PTT

## 2018-05-19 ENCOUNTER — Telehealth: Payer: Self-pay | Admitting: Vascular Surgery

## 2018-05-19 NOTE — Telephone Encounter (Signed)
-----   Message from Marty Heck, MD sent at 05/16/2018  8:12 AM EST ----- Can you arrange one month follow-up for Ms. Hoggard with me with left iliac venous duplex to evaluate stent prior to appointment?  Thanks,  Gerald Stabs

## 2018-05-19 NOTE — Telephone Encounter (Signed)
sch appt spk to pt 06/19/2018 8am ivc/iliac 06/20/2018 830am p/o MD

## 2018-05-21 ENCOUNTER — Emergency Department (HOSPITAL_COMMUNITY): Admission: EM | Admit: 2018-05-21 | Discharge: 2018-05-21 | Discharge status: Absent without leave | Payer: Self-pay

## 2018-05-21 NOTE — ED Notes (Signed)
No answer for triage.

## 2018-05-21 NOTE — ED Notes (Signed)
NO answer for triage

## 2018-05-22 ENCOUNTER — Telehealth: Payer: Self-pay | Admitting: Hematology and Oncology

## 2018-05-22 ENCOUNTER — Telehealth: Payer: Self-pay

## 2018-05-22 NOTE — Telephone Encounter (Signed)
Spoke with pt by phone regarding f/u on msg from after hours call center reporting pt c/o vag bleeding, SHOB and CP.  Pt was referred to the ED by call center.  Pt states that she did go to the ED last night but decided not to stay.  Pt states that she is no longer having SHOB or CP and only vag spotting.  Pt called our call center d/t she was discharged from hospital on 05/18/18 and instructed to f/u with Dr Alvy Bimler for hematology.  Pt has not yet been seen by Dr Alvy Bimler.  Pt educated on the I/o going to the ED to be evaluated if she has anymore SHOB or CP.  Pt verbalizes understanding.  This RN has contacted our new pt referral department to f/u on getting appt with Dr Alvy Bimler for this pt.

## 2018-05-22 NOTE — Telephone Encounter (Signed)
A new patient appt has been scheduled for the pt to see Dr. Alvy Bimler tomorrow on 2/25 at 2pm. Ms. Holly Graham has been cld and made aware to arrive 30 minutes early.

## 2018-05-23 ENCOUNTER — Inpatient Hospital Stay: Payer: Medicaid Other | Attending: Hematology and Oncology | Admitting: Hematology and Oncology

## 2018-05-23 ENCOUNTER — Inpatient Hospital Stay: Payer: Medicaid Other

## 2018-05-23 ENCOUNTER — Encounter: Payer: Self-pay | Admitting: Hematology and Oncology

## 2018-05-23 VITALS — BP 119/82 | HR 71 | Temp 98.0°F | Resp 18 | Ht 63.0 in | Wt 146.2 lb

## 2018-05-23 DIAGNOSIS — K5909 Other constipation: Secondary | ICD-10-CM | POA: Insufficient documentation

## 2018-05-23 DIAGNOSIS — D62 Acute posthemorrhagic anemia: Secondary | ICD-10-CM | POA: Insufficient documentation

## 2018-05-23 DIAGNOSIS — N859 Noninflammatory disorder of uterus, unspecified: Secondary | ICD-10-CM | POA: Diagnosis not present

## 2018-05-23 DIAGNOSIS — N858 Other specified noninflammatory disorders of uterus: Secondary | ICD-10-CM | POA: Insufficient documentation

## 2018-05-23 DIAGNOSIS — I2699 Other pulmonary embolism without acute cor pulmonale: Secondary | ICD-10-CM | POA: Diagnosis present

## 2018-05-23 DIAGNOSIS — R634 Abnormal weight loss: Secondary | ICD-10-CM | POA: Diagnosis not present

## 2018-05-23 LAB — COMPREHENSIVE METABOLIC PANEL
ALT: 30 U/L (ref 0–44)
ANION GAP: 11 (ref 5–15)
AST: 17 U/L (ref 15–41)
Albumin: 3.4 g/dL — ABNORMAL LOW (ref 3.5–5.0)
Alkaline Phosphatase: 190 U/L — ABNORMAL HIGH (ref 38–126)
BUN: 8 mg/dL (ref 6–20)
CHLORIDE: 101 mmol/L (ref 98–111)
CO2: 26 mmol/L (ref 22–32)
Calcium: 10.2 mg/dL (ref 8.9–10.3)
Creatinine, Ser: 0.74 mg/dL (ref 0.44–1.00)
GFR calc Af Amer: >60 mL/min (ref >60)
GFR calc non Af Amer: >60 mL/min (ref >60)
Glucose, Bld: 90 mg/dL (ref 70–99)
POTASSIUM: 4 mmol/L (ref 3.5–5.1)
Sodium: 138 mmol/L (ref 135–145)
Total Bilirubin: 0.3 mg/dL (ref 0.3–1.2)
Total Protein: 7.9 g/dL (ref 6.5–8.1)

## 2018-05-23 LAB — CBC WITH DIFFERENTIAL/PLATELET
Abs Immature Granulocytes: 0.04 10*3/uL (ref 0.00–0.07)
Basophils Absolute: 0 10*3/uL (ref 0.0–0.1)
Basophils Relative: 0 %
Eosinophils Absolute: 0.3 10*3/uL (ref 0.0–0.5)
Eosinophils Relative: 3 %
HCT: 28.9 % — ABNORMAL LOW (ref 36.0–46.0)
Hemoglobin: 9.1 g/dL — ABNORMAL LOW (ref 12.0–15.0)
Immature Granulocytes: 0 %
Lymphocytes Relative: 28 %
Lymphs Abs: 2.8 10*3/uL (ref 0.7–4.0)
MCH: 27.5 pg (ref 26.0–34.0)
MCHC: 31.5 g/dL (ref 30.0–36.0)
MCV: 87.3 fL (ref 80.0–100.0)
Monocytes Absolute: 0.8 10*3/uL (ref 0.1–1.0)
Monocytes Relative: 9 %
Neutro Abs: 5.9 10*3/uL (ref 1.7–7.7)
Neutrophils Relative %: 60 %
Platelets: 827 10*3/uL — ABNORMAL HIGH (ref 150–400)
RBC: 3.31 MIL/uL — AB (ref 3.87–5.11)
RDW: 17.3 % — AB (ref 11.5–15.5)
WBC: 9.8 10*3/uL (ref 4.0–10.5)
nRBC: 0 % (ref 0.0–0.2)

## 2018-05-23 LAB — IRON AND TIBC
Iron: 21 ug/dL — ABNORMAL LOW (ref 41–142)
Saturation Ratios: 6 % — ABNORMAL LOW (ref 21–57)
TIBC: 354 ug/dL (ref 236–444)
UIBC: 333 ug/dL (ref 120–384)

## 2018-05-23 LAB — SAMPLE TO BLOOD BANK

## 2018-05-23 LAB — FERRITIN: FERRITIN: 49 ng/mL (ref 11–307)

## 2018-05-23 LAB — SEDIMENTATION RATE: Sed Rate: 116 mm/hr — ABNORMAL HIGH (ref 0–22)

## 2018-05-23 NOTE — Assessment & Plan Note (Signed)
She has new onset of bowel habit changes The cause is unknown I recommend MiraLAX I am concerned about the pelvic mass as a cause of constipation. Oral iron supplement might exacerbate constipation. I will call her next week for update.  If she cannot tolerate oral iron, we will prescribe IV iron infusion.

## 2018-05-23 NOTE — Progress Notes (Signed)
[name] CONSULT NOTE  Patient Care Team: Patient, No Pcp Per as PCP - General (General Practice)   ASSESSMENT:   Bilateral pulmonary embolism ([place]) I have reviewed with the patient about the plan for care for severe DVT/PE.  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  I am also concerned for possible undiagnosed uterine malignancy. There is no benefit of ordering thrombophilic disorder in this situation She is appropriately anticoagulated at this point. She has appointment to see vascular surgery for further management.   Uterine mass She was evaluated by gynecologist She will likely need endometrial biopsy to exclude malignancy at some point.  Anemia associated with acute blood loss She was transfused.  However, she has excessive fatigue I will order blood work and iron studies If blood count is too low, I might consider blood transfusion I recommend oral iron supplement If she cannot tolerate that, she might need IV iron.  Other constipation She has new onset of bowel habit changes The cause is unknown I recommend MiraLAX I am concerned about the pelvic mass as a cause of constipation. Oral iron supplement might exacerbate constipation. I will call her next week for update.  If she cannot tolerate oral iron, we will prescribe IV iron infusion.  Weight loss, abnormal She has 30 pounds unintentional weight loss over the past year As above, malignancy cannot be excluded I will wait for her to get GYN evaluation next week  Orders Placed This Encounter  Procedures  . Comprehensive metabolic panel    Standing Status:   Future    Standing Expiration Date:   06/27/2019  . CBC with Differential/Platelet    Standing Status:   Future    Standing Expiration Date:   06/27/2019  . Ferritin    Standing Status:   Future    Standing Expiration Date:   05/23/2019  .  Iron and TIBC    Standing Status:   Future    Standing Expiration Date:   06/27/2019  . Reticulocytes ([place])    Standing Status:   Future    Standing Expiration Date:   06/27/2019  . Sedimentation rate    Standing Status:   Future    Standing Expiration Date:   06/27/2019  . Sample to Blood Bank    Standing Status:   Standing    Number of Occurrences:   33    Standing Expiration Date:   05/24/2019   All questions were answered. The patient knows to call the clinic with any problems, questions or concerns. I spent 55 minutes counseling the patient face to face. The total time spent in the appointment was 60 minutes and more than 50% was on counseling.  Heath Lark, MD 05/23/2018    CHIEF COMPLAINTS/PURPOSE OF CONSULTATION:  Severe DVT and bilateral PE s/p Thrombectomy Abnormal uterine mass  HISTORY OF PRESENTING ILLNESS:  Holly Graham 38 y.o. female is here because of recent diagnosis of DVT and PE  She initially presented to the emergency department at the end of January for symptom of chest pain but subsequently left without further evaluation She represented again to the emergency department on May 08, 2018 and was admitted and hospitalized until 05/18/2018. Her presentation was associated with chest pain, shortness of breath and leg swelling According to her, she has been having chest pain and shortness of breath since Christmas.  Her leg swelling was new since after the  new year  She underwent multiple investigation including CT angiogram on 05/08/2018 which revealed bilateral PE with acute right ventricular strain. Urgent echocardiogram did not reveal any signs of heart damage.  Ejection fraction is preserved.  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 She subsequently underwent ultrasound pelvis as it was thought that her uterine fibroid was compressing on the left iliac vein.  Ultrasound pelvis revealed endometrial mass She subsequently underwent MRI of  the pelvis which showed large uterine mass/fibroid.  She was evaluated by gynecologist and was placed on Megace. 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  Vascular surgery and interventional radiologist were consulted.  On 05/15/2018, she underwent the following procedures: Procedure Performed: 1.  Ultrasound-guided cannulation left popliteal vein 2.  Intravascular ultrasound left femoral vein, left common femoral vein, left common external iliac veins and IVC 3.  Stent of left common external iliac veins with 14 x 120 mm Vici 4.  Central venogram 5.  Mechanical thrombectomy of left common, external iliac veins and common femoral vein with Inari Clottriever  She was anticoagulated initially with heparin and then transition to oral anticoagulation therapy. During her hospital stay, she also received a unit of blood transfusion. With her last menstrual.,  She has been bleeding since April 30, 2018.  Recently, she passed a huge clot with the size of a cantaloupe.  She show me pictures that she took. She [place] at the age of 77, her usual cycle is 3 to 4 days, for cycle 28 days, usually nonheavy Over the last 6 months, she is bleeding heavier than usual, 5 to 7 days of a cycle of every 28 days She has been placed on Megace since hospitalization She has significant lower abdominal cramp She complaining of significant fatigue despite recent blood transfusion She also complained of anorexia and occasional postprandial nausea and vomiting. She also new onset of constipation.  She takes prune juice because without that, her bowel movement will be every 3 to 4 days She still have intermittent chest pain and shortness of breath but her leg swelling has mildly improved  She denies recent history of trauma, long distance travel, dehydration, recent surgery, smoking or prolonged immobilization. She had no prior history or diagnosis of cancer.  Her last Pap smear is more than 12 years ago.   She has not have any examination since her last child was born  The patient had been exposed to birth control pills and surgeries and never had thrombotic events. The patient had been pregnant before and denies history of peripartum thromboembolic event or history of recurrent miscarriages. There is no family history of blood clots or miscarriages.  MEDICAL HISTORY:  Past Medical History:  Diagnosis Date  . Fibroids 05/11/2018    SURGICAL HISTORY: Past Surgical History:  Procedure Laterality Date  . INTRAVASCULAR ULTRASOUND/IVUS N/A 05/15/2018   Procedure: INTRAVASCULAR ULTRASOUND/IVUS;  Surgeon: Waynetta Sandy, MD;  Location: [facility];  Service: Cardiovascular;  Laterality: N/A;  . PERIPHERAL VASCULAR THROMBECTOMY N/A 05/15/2018   Procedure: PERIPHERAL VASCULAR THROMBECTOMY;  Surgeon: Waynetta Sandy, MD;  Location: [facility];  Service: Cardiovascular;  Laterality: N/A;  . TUBAL LIGATION      SOCIAL HISTORY: Social History   Socioeconomic History  . Marital status: Single    Spouse name: Not on file  . Number of children: Not on file  . Years of education: Not on file  . Highest education level: Not on file  Occupational History  .  Not on file  Social Needs  . Financial resource strain: Not on file  . Food insecurity:    Worry: Not on file    Inability: Not on file  . Transportation needs:    Medical: Not on file    Non-medical: Not on file  Tobacco Use  . Smoking status: Never Smoker  . Smokeless tobacco: Never Used  Substance and Sexual Activity  . Alcohol use: Yes  . Drug use: Never  . Sexual activity: Yes  Lifestyle  . Physical activity:    Days per week: Not on file    Minutes per session: Not on file  . Stress: Not on file  Relationships  . Social connections:    Talks on phone: Not on file    Gets together: Not on file    Attends religious service: Not on file    Active member of club or organization: Not on file     Attends meetings of clubs or organizations: Not on file    Relationship status: Not on file  . Intimate partner violence:    Fear of current or ex partner: Not on file    Emotionally abused: Not on file    Physically abused: Not on file    Forced sexual activity: Not on file  Other Topics Concern  . Not on file  Social History Narrative  . Not on file    FAMILY HISTORY: Family History  Problem Relation Age of Onset  . Pulmonary embolism Brother     ALLERGIES:  has No Known Allergies.  MEDICATIONS:  Current Outpatient Medications  Medication Sig Dispense Refill  . megestrol (MEGACE) 40 MG tablet Take 2 tablets (80 mg total) by mouth 2 (two) times daily. 60 tablet 1  . Rivaroxaban 15 & 20 MG TBPK Take as directed on package-Start with one 15mg  tablet by mouth twice a day with food. On Day 22, switch to one 20mg    daily with food. 51 each 0   No current facility-administered medications for this visit.     REVIEW OF SYSTEMS:   Constitutional: Denies fevers, chills or abnormal night sweats Eyes: Denies blurriness of vision, double vision or watery eyes Ears, nose, mouth, throat, and face: Denies mucositis or sore throat Skin: Denies abnormal skin rashes Lymphatics: Denies new lymphadenopathy or easy bruising Neurological:Denies numbness, tingling or new weaknesses Behavioral/Psych: Mood is stable, no new changes  All other systems were reviewed with the patient and are negative.  PHYSICAL EXAMINATION: ECOG PERFORMANCE STATUS: 1 - Symptomatic but completely ambulatory  Vitals:   05/23/18 1412  BP: 119/82  Pulse: 71  Resp: 18  Temp: 98 F (36.7 C)  SpO2: 100%   Filed Weights   05/23/18 1412  Weight: 146 lb 3.2 oz (66.3 kg)    GENERAL:alert, no distress and comfortable SKIN: skin color, texture, turgor are normal, no rashes or significant lesions EYES: normal, conjunctiva are pink and non-injected, sclera clear OROPHARYNX:no exudate, no erythema and lips,  buccal mucosa, and tongue normal  NECK: supple, thyroid normal size, non-tender, without nodularity LYMPH:  no palpable lymphadenopathy in the cervical, axillary or inguinal LUNGS: clear to auscultation and percussion with normal breathing effort HEART: regular rate & rhythm and no murmurs and no lower extremity edema ABDOMEN:abdomen soft, probable uterine mass up until mid umbilical region, nontender Musculoskeletal:no cyanosis of digits and no clubbing  PSYCH: alert & oriented x 3 with fluent speech NEURO: no focal motor/sensory deficits  LABORATORY DATA:  I have reviewed  the data as listed Lab Results  Component Value Date   WBC 12.7 (H) 05/18/2018   HGB 9.2 (L) 05/18/2018   HCT 28.5 (L) 05/18/2018   MCV 85.3 05/18/2018   PLT 520 (H) 05/18/2018    RADIOGRAPHIC STUDIES: I have reviewed recent MRI with the patient I have personally reviewed the radiological images as listed and agreed with the findings in the report. Dg Chest 2 View  Result Date: 05/08/2018 CLINICAL DATA:  Chest pain, shortness of Breath EXAM: CHEST - 2 VIEW COMPARISON:  04/25/2018 FINDINGS: Heart and mediastinal contours are within normal limits. No focal opacities or effusions. No acute bony abnormality. IMPRESSION: No active cardiopulmonary disease. Electronically Signed   By: Rolm Baptise M.D.   On: 05/08/2018 01:24   Dg Chest 2 View  Result Date: 04/25/2018 CLINICAL DATA:  Nausea and fatigue for 8 months. EXAM: CHEST - 2 VIEW COMPARISON:  None. FINDINGS: The heart size and mediastinal contours are within normal limits. Both lungs are clear. The visualized skeletal structures are unremarkable. IMPRESSION: No active cardiopulmonary disease. Electronically Signed   By: Ashley Royalty M.D.   On: 04/25/2018 01:02   Ct Angio Chest Pe W And/or Wo Contrast  Result Date: 05/08/2018 CLINICAL DATA:  38 y/o F; swelling of left leg with no known injury. Shortness of breath and chest tightness. EXAM: CT ANGIOGRAPHY CHEST WITH  CONTRAST TECHNIQUE: Multidetector CT imaging of the chest was performed using the standard protocol during bolus administration of intravenous contrast. Multiplanar CT image reconstructions and MIPs were obtained to evaluate the vascular anatomy. CONTRAST:  55 cc Isovue 370 COMPARISON:  05/08/2018 chest radiograph. FINDINGS: Cardiovascular: Acute right lobar and bilateral segmental pulmonary emboli. RV/LV = 1.5. Normal caliber thoracic aorta. Mediastinum/Nodes: No enlarged mediastinal, hilar, or axillary lymph nodes. Thyroid gland, trachea, and esophagus demonstrate no significant findings. Lungs/Pleura: Lungs are clear. No pleural effusion or pneumothorax. Upper Abdomen: No acute abnormality. Musculoskeletal: No chest wall abnormality. No acute or significant osseous findings. Review of the MIP images confirms the above findings. IMPRESSION: Positive for acute PE with CTevidence of right heart strain (RV/LV Ratio = 1.5) consistent with at least submassive (intermediate risk) PE. The presence of right heart strain has been associated with an increased risk of morbidity and mortality. Critical Value/emergent results were called by telephone at the time of interpretation on 05/08/2018 at 4:29 am to Dr. Pryor Curia , who verbally acknowledged these results. Electronically Signed   By: Kristine Garbe M.D.   On: 05/08/2018 04:30   Mr Pelvis W Wo Contrast  Result Date: 05/10/2018 CLINICAL DATA:  38 year old female inpatient admitted with pulmonary embolism and enlarged myomatous uterus. History of tubal ligation. EXAM: MRI PELVIS WITHOUT AND WITH CONTRAST TECHNIQUE: Multiplanar multisequence MR imaging of the pelvis was performed both before and after administration of intravenous contrast. CONTRAST:  6 cc Gadavist IV COMPARISON:  05/08/2018 pelvic sonogram. FINDINGS: Urinary Tract:  Normal nondistended bladder.  Normal urethra. Bowel: Visualized small and large bowel are normal caliber with no bowel wall  thickening. Vascular/Lymphatic: No pathologically enlarged lymph nodes in the pelvis. Small nonocclusive thrombus in the left common femoral vein (series 10/image 24). Nonocclusive tubular acute thrombi throughout the left common and external iliac veins. Apparent focal narrowing of the left common iliac vein (series 10/image 4), although this does not correlate with extrinsic compression from the enlarged myomatous uterus on these images. Asymmetric edema surrounding the length of the left common and external iliac veins. No evidence of right pelvic  deep venous thrombosis. Reproductive: Uterus: The enlarged myomatous uterus measures 16.0 x 10.2 x 11.5 cm (volume = 983 cm^3). There are multiple enhancing uterine fibroids (greater than 10), with representative fibroids as follows: -right fundal intramural 6.7 x 4.8 x 4.9 cm (volume = 83 cm^3) fibroid -left uterine body submucosal 1.8 x 1.3 x 1.2 cm (volume = 1.5 cm^3) fibroid -posterior left upper uterine body 4.7 x 3.8 x 3.3 cm (volume = 31 cm^3) fibroid with approximately 40% submucosal component A few of the fibroids demonstrate heterogeneous T2 hyperintensity indicative of partial cystic degeneration. No intracavitary or pedunculated fibroids are evident. Inner myometrium (junctional zone) is poorly delineated and measures approximately 7 mm in thickness, which is within normal limits. Endometrium is distorted by surrounding fibroids and measures approximately 6 mm in bilayer thickness, which is within normal limits. No endometrial cavity fluid or focal endometrial mass. Scattered small nabothian cysts in the cervix. Fibrous cervical stroma is intact. Ovaries and Adnexa: The right ovary measures 5.8 x 3.5 x 5.2 cm and is normal with several simple follicular cysts measuring up to the 3.2 cm. The left ovary measures 4.5 x 3.0 x 3.1 cm and is normal with a small hemorrhagic corpus luteum (series 4/image 10). There are no suspicious ovarian or adnexal masses. Other:  No abnormal free fluid in the pelvis. No focal pelvic fluid collection. Musculoskeletal: No aggressive appearing focal osseous lesions. IMPRESSION: 1. Acute nonocclusive deep venous thrombosis in the left pelvis involving the left common and external iliac and left common femoral veins. Apparent focal narrowing of the left common iliac vein, which does not appear to correlate with extrinsic compression from the enlarged myomatous uterus. 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 2. Enlarged myomatous uterus as detailed. Please note that MRI can not reliably distinguish benign uterine fibroids from leiomyosarcoma. Although there are no overtly aggressive features associated with these uterine fibroids on this MRI study, clinical follow-up is necessary. 3. No ovarian or adnexal abnormality. Electronically Signed   By: Ilona Sorrel M.D.   On: 05/10/2018 20:57   US Pelvis (transabdominal Only)  Result Date: 05/08/2018 CLINICAL DATA:  Clinical concern for a large fibroid. EXAM: TRANSABDOMINAL ULTRASOUND OF PELVIS TECHNIQUE: Transabdominal ultrasound examination of the pelvis was performed including evaluation of the uterus, ovaries, adnexal regions, and pelvic cul-de-sac. COMPARISON:  None. FINDINGS: Uterus Measurements: 13.4 x 8.5 x 10.1 cm = volume: 602 mL. There is a large intramural myometrial mass within the fundus of the uterus measuring 9.5 by 11.2 by 8.2 cm. Endometrium The endometrium is poorly visualized. Right ovary Measurements: 5.5 x 5.2 x 3.3 cm = volume: 49.3 mL. Several benign-appearing cysts, the largest measuring 2.7 cm. Left ovary Measurements: 3.6 x 4.3 x 2.6 cm = volume: 21 mL. Normal appearance/no adnexal mass. Other findings:  No abnormal free fluid. IMPRESSION: Enlarged uterus containing a large myometrial mass within the fundus which measures 11.2 cm in greatest dimension. If surgical intervention is not planned, further evaluation with contrast-enhanced MRI of the pelvis may be considered. Normal  appearance of the ovaries. Electronically Signed   By: Fidela Salisbury M.D.   On: 05/08/2018 22:23   Dg Chest Port 1 View  Result Date: 05/16/2018 CLINICAL DATA:  38 y/o  F; chest pain. EXAM: PORTABLE CHEST 1 VIEW COMPARISON:  05/08/2018 chest radiograph and CT chest. FINDINGS: Stable heart size and mediastinal contours are within normal limits. Both lungs are clear. The visualized skeletal structures are unremarkable. IMPRESSION: Clear lungs. Electronically Signed   By: Kristine Garbe  M.D.   On: 05/16/2018 14:19   Vas [place] Lower Extremity Venous (dvt)  Result Date: 05/08/2018  Lower Venous Study Risk Factors: Confirmed PE. Comparison Study: No prior study on file for comparison Performing Technologist: Sharion Dove RVS Supporting Technologist: June Leap RDMS, RVT  Examination Guidelines: A complete evaluation includes B-mode imaging, spectral Doppler, color Doppler, and power Doppler as needed of all accessible portions of each vessel. Bilateral testing is considered an integral part of a complete examination. Limited examinations for reoccurring indications may be performed as noted.  Right Venous Findings: +---------+---------------+---------+-----------+----------+-------+          CompressibilityPhasicitySpontaneityPropertiesSummary +---------+---------------+---------+-----------+----------+-------+ CFV      Full           Yes      Yes                          +---------+---------------+---------+-----------+----------+-------+ SFJ      Full                                                 +---------+---------------+---------+-----------+----------+-------+ FV Prox  Full                                                 +---------+---------------+---------+-----------+----------+-------+ FV Mid   Full                                                 +---------+---------------+---------+-----------+----------+-------+ FV DistalFull                                                  +---------+---------------+---------+-----------+----------+-------+ PFV      Full                                                 +---------+---------------+---------+-----------+----------+-------+ POP      Full           Yes      Yes                          +---------+---------------+---------+-----------+----------+-------+ PTV      Full                                                 +---------+---------------+---------+-----------+----------+-------+ PERO     Full                                                 +---------+---------------+---------+-----------+----------+-------+  Left Venous Findings: +---------+---------------+---------+-----------+----------+-------+  CompressibilityPhasicitySpontaneityPropertiesSummary +---------+---------------+---------+-----------+----------+-------+ CFV      Full           No       No                           +---------+---------------+---------+-----------+----------+-------+ SFJ      Full           No       No                           +---------+---------------+---------+-----------+----------+-------+ FV Prox  Full                                                 +---------+---------------+---------+-----------+----------+-------+ FV Mid   Full                                                 +---------+---------------+---------+-----------+----------+-------+ FV DistalFull                                                 +---------+---------------+---------+-----------+----------+-------+ PFV      Full           No       No                           +---------+---------------+---------+-----------+----------+-------+ POP      Full           No       No                           +---------+---------------+---------+-----------+----------+-------+ PTV      Full                                                  +---------+---------------+---------+-----------+----------+-------+ PERO     Full                                                 +---------+---------------+---------+-----------+----------+-------+ iliac    None           No       No                   Acute   +---------+---------------+---------+-----------+----------+-------+ Continuous waveforms throughout with no evidence of lower extremity DVT    Summary: Right: There is no evidence of deep vein thrombosis in the lower extremity. Left: There is no evidence of deep vein thrombosis in the lower extremity. Continuous waveforms throughout with no evidence of lower extremity DVT. Thrombus noted in the left iliac but does not extend into the IVC  *See table(s) above for measurements and observations. Electronically signed by Juanda Crumble  Fields MD on 05/08/2018 at 5:17:53 PM.    Final

## 2018-05-23 NOTE — Assessment & Plan Note (Addendum)
She was transfused.  However, she has excessive fatigue I will order blood work and iron studies If blood count is too low, I might consider blood transfusion I recommend oral iron supplement If she cannot tolerate that, she might need IV iron.

## 2018-05-23 NOTE — Assessment & Plan Note (Signed)
She has 30 pounds unintentional weight loss over the past year As above, malignancy cannot be excluded I will wait for her to get GYN evaluation next week

## 2018-05-23 NOTE — Assessment & Plan Note (Signed)
She was evaluated by gynecologist She will likely need endometrial biopsy to exclude malignancy at some point.

## 2018-05-23 NOTE — Assessment & Plan Note (Signed)
I have reviewed with the patient about the plan for care for severe DVT/PE.  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  I am also concerned for possible undiagnosed uterine malignancy. There is no benefit of ordering thrombophilic disorder in this situation She is appropriately anticoagulated at this point. She has appointment to see vascular surgery for further management.

## 2018-05-24 ENCOUNTER — Telehealth: Payer: Self-pay

## 2018-05-24 NOTE — Telephone Encounter (Signed)
Called and left below message, ask her to call the office if needed.

## 2018-05-24 NOTE — Telephone Encounter (Signed)
-----   Message from Heath Lark, MD sent at 05/24/2018  7:50 AM EST ----- Regarding: iron def Pls call her and let her know test results showed iron def Continue taking iron supplement as discussed ----- Message ----- From: Interface, Lab In [facility] Sent: 05/23/2018   3:10 PM EST To: Heath Lark, MD

## 2018-05-31 ENCOUNTER — Ambulatory Visit (INDEPENDENT_AMBULATORY_CARE_PROVIDER_SITE_OTHER): Payer: Medicaid Other | Admitting: Primary Care

## 2018-05-31 ENCOUNTER — Other Ambulatory Visit: Payer: Self-pay

## 2018-05-31 ENCOUNTER — Encounter (INDEPENDENT_AMBULATORY_CARE_PROVIDER_SITE_OTHER): Payer: Self-pay | Admitting: Primary Care

## 2018-05-31 VITALS — BP 138/94 | HR 92 | Temp 98.1°F | Ht 63.0 in | Wt 141.0 lb

## 2018-05-31 DIAGNOSIS — I2699 Other pulmonary embolism without acute cor pulmonale: Secondary | ICD-10-CM | POA: Diagnosis not present

## 2018-05-31 DIAGNOSIS — R112 Nausea with vomiting, unspecified: Secondary | ICD-10-CM | POA: Diagnosis not present

## 2018-05-31 DIAGNOSIS — Z7689 Persons encountering health services in other specified circumstances: Secondary | ICD-10-CM

## 2018-05-31 DIAGNOSIS — N939 Abnormal uterine and vaginal bleeding, unspecified: Secondary | ICD-10-CM | POA: Diagnosis not present

## 2018-05-31 DIAGNOSIS — Z09 Encounter for follow-up examination after completed treatment for conditions other than malignant neoplasm: Secondary | ICD-10-CM | POA: Diagnosis not present

## 2018-05-31 DIAGNOSIS — Z23 Encounter for immunization: Secondary | ICD-10-CM

## 2018-05-31 MED ORDER — ONDANSETRON HCL 4 MG PO TABS: 4.0000 mg | ORAL_TABLET | Freq: Three times a day (TID) | ORAL | 0 refills | Status: DC | PRN

## 2018-05-31 NOTE — Progress Notes (Signed)
Established Patient Office Visit  Subjective:  Patient ID: Holly Graham, female    DOB: 1980-08-05  Age: 38 y.o. MRN: [id number]  CC:  Chief Complaint  Patient presents with  . New Patient (Initial Visit)    SOB    HPI Holly Graham presents to establish care and hospital follow. She was hospitalized for 10 days , she went to the ED for chest pain and shortness of breath. Discharged on [place]. [name] is experiencing passing numerous clots ranging in size the largest is a tennis ball. My thought was to seen her back to ED but patient declines states she has a follow up tomorrow with GYN.  Past Medical History:  Diagnosis Date  . Fibroids 05/11/2018    Past Surgical History:  Procedure Laterality Date  . INTRAVASCULAR ULTRASOUND/IVUS N/A 05/15/2018   Procedure: INTRAVASCULAR ULTRASOUND/IVUS;  Surgeon: Waynetta Sandy, MD;  Location: [facility];  Service: Cardiovascular;  Laterality: N/A;  . PERIPHERAL VASCULAR THROMBECTOMY N/A 05/15/2018   Procedure: PERIPHERAL VASCULAR THROMBECTOMY;  Surgeon: Waynetta Sandy, MD;  Location: [facility];  Service: Cardiovascular;  Laterality: N/A;  . TUBAL LIGATION      Family History  Problem Relation Age of Onset  . Pulmonary embolism Brother     Social History   Socioeconomic History  . Marital status: Single    Spouse name: Not on file  . Number of children: Not on file  . Years of education: Not on file  . Highest education level: Not on file  Occupational History  . Not on file  Social Needs  . Financial resource strain: Not on file  . Food insecurity:    Worry: Not on file    Inability: Not on file  . Transportation needs:    Medical: Not on file    Non-medical: Not on file  Tobacco Use  . Smoking status: Never Smoker  . Smokeless tobacco: Never Used  Substance and Sexual Activity  . Alcohol use: Yes  . Drug use: Never  . Sexual activity: Yes  Lifestyle  . Physical activity:     Days per week: Not on file    Minutes per session: Not on file  . Stress: Not on file  Relationships  . Social connections:    Talks on phone: Not on file    Gets together: Not on file    Attends religious service: Not on file    Active member of club or organization: Not on file    Attends meetings of clubs or organizations: Not on file    Relationship status: Not on file  . Intimate partner violence:    Fear of current or ex partner: Not on file    Emotionally abused: Not on file    Physically abused: Not on file    Forced sexual activity: Not on file  Other Topics Concern  . Not on file  Social History Narrative  . Not on file    Outpatient Medications Prior to Visit  Medication Sig Dispense Refill  . megestrol (MEGACE) 40 MG tablet Take 2 tablets (80 mg total) by mouth 2 (two) times daily. 60 tablet 1  . Rivaroxaban 15 & 20 MG TBPK Take as directed on package-Start with one 15mg  tablet by mouth twice a day with food. On Day 22, switch to one 20mg    daily with food. 51 each 0   No facility-administered medications prior to visit.     No Known Allergies  ROS Review of Systems  Constitutional: Positive for appetite change and fatigue.  HENT: Negative.   Eyes: Negative.   Respiratory: Positive for shortness of breath.   Cardiovascular: Negative.   Gastrointestinal: Positive for nausea.  Endocrine: Negative.   Genitourinary: Positive for pelvic pain and vaginal bleeding.  Skin: Negative.   Allergic/Immunologic: Negative.   Neurological: Negative.   Hematological: Negative.   Psychiatric/Behavioral: Positive for agitation.      Objective:    Physical Exam  Constitutional: She is oriented to person, place, and time. She appears well-developed.  Eyes: Pupils are equal, round, and reactive to light.  Cardiovascular: Normal rate and regular rhythm.  Pulmonary/Chest: Effort normal.  Shortness of breath cont's  Abdominal: Soft. There is abdominal tenderness.   Musculoskeletal: Normal range of motion.  Neurological: She is alert and oriented to person, place, and time.  Skin: Skin is warm and dry.  Psychiatric: She has a normal mood and affect.    BP (!) 138/94 (BP Location: Right Arm, Patient Position: Sitting, Cuff Size: Normal)   Pulse 92   Temp 98.1 F (36.7 C) (Oral)   Ht 5\' 3"  (1.6 m)   Wt 141 lb (64 kg)   LMP 05/04/2018   SpO2 100%   BMI 24.98 kg/m  Wt Readings from Last 3 Encounters:  05/31/18 141 lb (64 kg)  05/23/18 146 lb 3.2 oz (66.3 kg)  05/08/18 146 lb 12.8 oz (66.6 kg)     Health Maintenance Due  Topic Date Due  . TETANUS/TDAP  12/02/1998  . PAP SMEAR-Modifier  12/01/2000    There are no preventive care reminders to display for this patient.  Lab Results  Component Value Date   TSH 1.301 05/11/2018   Lab Results  Component Value Date   WBC 9.8 05/23/2018   HGB 9.1 (L) 05/23/2018   HCT 28.9 (L) 05/23/2018   MCV 87.3 05/23/2018   PLT 827 (H) 05/23/2018      Assessment & Plan:   Problem List Items Addressed This Visit    Bilateral pulmonary embolism (HCC) - Primary   Abnormal uterine bleeding (AUB)   Uterine bleeding   Nausea with vomiting    Other Visit Diagnoses    Encounter to establish care        Sanjuana was seen today for new patient (initial visit).  Diagnoses and all orders for this visit:  Bilateral pulmonary embolism ([place])  went to ED for shortness of breath   Abnormal uterine bleeding (AUB) Has a GYN visit tomorrow   Encounter to establish care  Uterine bleeding Patient has a GYN appt see HPI  Nausea and vomiting, intractability of vomiting not specified, unspecified vomiting type Sent in zofran unable to eat even small amounts with out n/v   Follow-up: Return in about 3 months (around 08/31/2018).    Holly Perna, NP

## 2018-06-01 ENCOUNTER — Encounter: Payer: Self-pay | Admitting: Obstetrics and Gynecology

## 2018-06-01 ENCOUNTER — Other Ambulatory Visit (HOSPITAL_COMMUNITY)
Admission: RE | Admit: 2018-06-01 | Discharge: 2018-06-01 | Disposition: A | Discharge status: Home / Self Care | Payer: Medicaid Other | Source: Ambulatory Visit | Attending: Obstetrics and Gynecology | Admitting: Obstetrics and Gynecology

## 2018-06-01 ENCOUNTER — Ambulatory Visit (INDEPENDENT_AMBULATORY_CARE_PROVIDER_SITE_OTHER): Payer: Medicaid Other | Admitting: Obstetrics and Gynecology

## 2018-06-01 ENCOUNTER — Telehealth: Payer: Self-pay

## 2018-06-01 VITALS — BP 145/91 | Wt 140.2 lb

## 2018-06-01 DIAGNOSIS — N939 Abnormal uterine and vaginal bleeding, unspecified: Secondary | ICD-10-CM | POA: Insufficient documentation

## 2018-06-01 DIAGNOSIS — Z124 Encounter for screening for malignant neoplasm of cervix: Secondary | ICD-10-CM | POA: Diagnosis not present

## 2018-06-01 DIAGNOSIS — Z113 Encounter for screening for infections with a predominantly sexual mode of transmission: Secondary | ICD-10-CM

## 2018-06-01 DIAGNOSIS — D250 Submucous leiomyoma of uterus: Secondary | ICD-10-CM | POA: Diagnosis not present

## 2018-06-01 MED ORDER — NORETHINDRONE ACETATE 5 MG PO TABS: 5.0000 mg | ORAL_TABLET | Freq: Every day | ORAL | 2 refills | Status: DC

## 2018-06-01 NOTE — Progress Notes (Addendum)
GYNECOLOGY OFFICE FOLLOW UP NOTE  History:  38 y.o. [id number] here today for follow up from hospital for PE with bleeding. Last 6 months, periods have become heavier, she is using a pad and tampon. Having periods 7-10 days now, was having periods 3 days and lighter. She is still having periods monthly. Occasional intermenstrual spotting but nothing major. No clotting disorder that she is aware of.   Now taking Megace 80 BID as prescribed in hospital. Has not made bleeding any lighter. Last period started 05/04/18. Is taking Xarelto as prescribed. Also complains of fatigue and weight loss, having nausea and feels like she is throwing up intermittently when she eats, no known cause  Last pap: 12 years ago, has never had abnormal pap   Past Medical History:  Diagnosis Date  . Fibroids 05/11/2018  . Pulmonary emboli [facility])     Past Surgical History:  Procedure Laterality Date  . DILATION AND CURETTAGE OF UTERUS    . INTRAVASCULAR ULTRASOUND/IVUS N/A 05/15/2018   Procedure: INTRAVASCULAR ULTRASOUND/IVUS;  Surgeon: Waynetta Sandy, MD;  Location: [facility];  Service: Cardiovascular;  Laterality: N/A;  . PERIPHERAL VASCULAR THROMBECTOMY N/A 05/15/2018   Procedure: PERIPHERAL VASCULAR THROMBECTOMY;  Surgeon: Waynetta Sandy, MD;  Location: [facility];  Service: Cardiovascular;  Laterality: N/A;  . TUBAL LIGATION       Current Outpatient Medications:  .  megestrol (MEGACE) 40 MG tablet, Take 2 tablets (80 mg total) by mouth 2 (two) times daily., Disp: 60 tablet, Rfl: 1 .  Rivaroxaban 15 & 20 MG TBPK, Take as directed on package-Start with one 15mg  tablet by mouth twice a day with food. On Day 22, switch to one 20mg    daily with food., Disp: 51 each, Rfl: 0 .  norethindrone (AYGESTIN) 5 MG tablet, Take 1 tablet (5 mg total) by mouth daily., Disp: 30 tablet, Rfl: 2 .  ondansetron (ZOFRAN) 4 MG tablet, Take 1 tablet (4 mg total) by mouth every 8 (eight) hours as  needed for nausea or vomiting. (Patient not taking: Reported on 06/01/2018), Disp: 20 tablet, Rfl: 0  The following portions of the patient's history were reviewed and updated as appropriate: allergies, current medications, past family history, past medical history, past social history, past surgical history and problem list.   Review of Systems:  Pertinent items noted in HPI and remainder of comprehensive ROS otherwise negative.   Objective:  Physical Exam BP (!) 145/91   Wt 140 lb 3.2 oz (63.6 kg)   LMP 05/04/2018   BMI 24.84 kg/m  CONSTITUTIONAL: Well-developed, well-nourished female in no acute distress.  HENT:  Normocephalic, atraumatic. External right and left ear normal. Oropharynx is clear and moist EYES: Conjunctivae and EOM are normal. Pupils are equal, round, and reactive to light. No scleral icterus.  NECK: Normal range of motion, supple, no masses SKIN: Skin is warm and dry. No rash noted. Not diaphoretic. No erythema. No pallor. NEUROLOGIC: Alert and oriented to person, place, and time. Normal reflexes, muscle tone coordination. No cranial nerve deficit noted. PSYCHIATRIC: Normal mood and affect. Normal behavior. Normal judgment and thought content. CARDIOVASCULAR: Normal heart rate noted RESPIRATORY: Effort normal, no problems with respiration noted ABDOMEN: Soft, no distention noted.   PELVIC: Normal appearing external genitalia; normal appearing vaginal mucosa and cervix, cervix tilted posteriorly, moderare dark red clot in vagina. No abnormal discharge noted.  Pap smear obtained.  pelvic cultures obtained. Enlarged uterine size, up to 20 weeks sized, no other palpable  masses, mild uterine tenderness, no adnexal tenderness. MUSCULOSKELETAL: Normal range of motion. No edema noted.   Assessment & Plan:   1. Abnormal uterine bleeding (AUB) - No improvement on megace - Not surgical candidate due to PE for now -will plan for hysterectomy at some point once stable given  enlarged, fibroid uterus - switch to aygestin 5 mg daily, patient to call if no improvement in bleeding and will increase dose - return 3 weeks for f/u and EMB  2. Cervical cancer screening - Cytology - PAP  3. Submucous leiomyoma of uterus See above  4. STI screen - GC/CT done with pap  Routine preventative health maintenance measures emphasized. Please refer to After Visit Summary for other counseling recommendations.   Return in about 3 weeks (around 06/22/2018).   Feliz Beam, M.D. Attending [facility] for [employer] [facility])

## 2018-06-01 NOTE — Telephone Encounter (Signed)
She called and left a message. She is requesting a letter to go back to work on 3/9 with limitations. She is still having breathing issues, chest tightness, shortness of breath issues and weakness.

## 2018-06-02 ENCOUNTER — Inpatient Hospital Stay (HOSPITAL_BASED_OUTPATIENT_CLINIC_OR_DEPARTMENT_OTHER): Payer: Medicaid Other | Admitting: Hematology and Oncology

## 2018-06-02 ENCOUNTER — Encounter: Payer: Self-pay | Admitting: Hematology and Oncology

## 2018-06-02 ENCOUNTER — Other Ambulatory Visit: Payer: Self-pay | Admitting: *Deleted

## 2018-06-02 ENCOUNTER — Other Ambulatory Visit: Payer: Self-pay | Admitting: Hematology and Oncology

## 2018-06-02 ENCOUNTER — Telehealth: Payer: Self-pay

## 2018-06-02 ENCOUNTER — Inpatient Hospital Stay: Payer: Medicaid Other | Attending: Hematology and Oncology

## 2018-06-02 VITALS — BP 140/93 | HR 97 | Temp 98.3°F | Resp 18 | Ht 63.0 in | Wt 143.4 lb

## 2018-06-02 DIAGNOSIS — N939 Abnormal uterine and vaginal bleeding, unspecified: Secondary | ICD-10-CM

## 2018-06-02 DIAGNOSIS — I2699 Other pulmonary embolism without acute cor pulmonale: Secondary | ICD-10-CM

## 2018-06-02 DIAGNOSIS — Z7901 Long term (current) use of anticoagulants: Secondary | ICD-10-CM | POA: Diagnosis not present

## 2018-06-02 DIAGNOSIS — D62 Acute posthemorrhagic anemia: Secondary | ICD-10-CM

## 2018-06-02 DIAGNOSIS — N858 Other specified noninflammatory disorders of uterus: Secondary | ICD-10-CM | POA: Insufficient documentation

## 2018-06-02 LAB — CBC WITH DIFFERENTIAL/PLATELET
Abs Immature Granulocytes: 0.06 K/uL (ref 0.00–0.07)
Basophils Absolute: 0 K/uL (ref 0.0–0.1)
Basophils Relative: 0 %
Eosinophils Absolute: 0.2 K/uL (ref 0.0–0.5)
Eosinophils Relative: 2 %
HCT: 26.2 % — ABNORMAL LOW (ref 36.0–46.0)
Hemoglobin: 8 g/dL — ABNORMAL LOW (ref 12.0–15.0)
Immature Granulocytes: 1 %
Lymphocytes Relative: 24 %
Lymphs Abs: 2.5 K/uL (ref 0.7–4.0)
MCH: 27.6 pg (ref 26.0–34.0)
MCHC: 30.5 g/dL (ref 30.0–36.0)
MCV: 90.3 fL (ref 80.0–100.0)
Monocytes Absolute: 0.6 K/uL (ref 0.1–1.0)
Monocytes Relative: 6 %
Neutro Abs: 6.8 K/uL (ref 1.7–7.7)
Neutrophils Relative %: 67 %
Platelets: 493 K/uL — ABNORMAL HIGH (ref 150–400)
RBC: 2.9 MIL/uL — ABNORMAL LOW (ref 3.87–5.11)
RDW: 20.4 % — ABNORMAL HIGH (ref 11.5–15.5)
WBC: 10.2 K/uL (ref 4.0–10.5)
nRBC: 0.5 % — ABNORMAL HIGH (ref 0.0–0.2)

## 2018-06-02 LAB — PREPARE RBC (CROSSMATCH)

## 2018-06-02 LAB — SAMPLE TO BLOOD BANK

## 2018-06-02 NOTE — Assessment & Plan Note (Signed)
She is currently being treated by GYN group with follow-up appointment in 3 weeks I will defer to them for further management

## 2018-06-02 NOTE — Addendum Note (Signed)
Addended by: Paulla Dolly on: 06/02/2018 01:52 PM   Modules accepted: Orders

## 2018-06-02 NOTE — Assessment & Plan Note (Signed)
I have reviewed with the patient about the plan for care for severe DVT/PE.  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  I am also concerned for possible undiagnosed uterine malignancy. There is no benefit of ordering thrombophilic disorder in this situation She is appropriately anticoagulated at this point. She has appointment to see vascular surgery for further management. She needs to remain on anticoagulation therapy for minimum 1 year

## 2018-06-02 NOTE — Progress Notes (Signed)
[facility] OFFICE PROGRESS NOTE  [name], NP  ASSESSMENT & PLAN:  Bilateral pulmonary embolism ([place]) I have reviewed with the patient about the plan for care for severe DVT/PE.  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  I am also concerned for possible undiagnosed uterine malignancy. There is no benefit of ordering thrombophilic disorder in this situation She is appropriately anticoagulated at this point. She has appointment to see vascular surgery for further management. She needs to remain on anticoagulation therapy for minimum 1 year   Anemia associated with acute blood loss We discussed some of the risks, benefits, and alternatives of blood transfusions. The patient is symptomatic from anemia and the hemoglobin level is critically low.  Some of the side-effects to be expected including risks of transfusion reactions, chills, infection, syndrome of volume overload and risk of hospitalization from various reasons and the patient is willing to proceed and went ahead to sign consent today. We will proceed with 2 units of blood tomorrow She will return at the end of the month for repeat blood count check  Uterine mass She is currently being treated by GYN group with follow-up appointment in 3 weeks I will defer to them for further management   No orders of the defined types were placed in this encounter.   INTERVAL HISTORY: Holly Graham 38 y.o. female returns for urgent follow-up She has excessive fatigue and shortness of breath She continues to have heavy menstruation.  Denies hematuria or hematochezia  SUMMARY OF HEMATOLOGIC HISTORY:  Holly Graham 38 y.o. female is here because of recent diagnosis of DVT and PE  She initially presented to the emergency department at the end of January for symptom of chest pain but subsequently left without further  evaluation She represented again to the emergency department on May 08, 2018 and was admitted and hospitalized until 05/18/2018. Her presentation was associated with chest pain, shortness of breath and leg swelling According to her, she has been having chest pain and shortness of breath since Christmas.  Her leg swelling was new since after the new year  She underwent multiple investigation including CT angiogram on 05/08/2018 which revealed bilateral PE with acute right ventricular strain. Urgent echocardiogram did not reveal any signs of heart damage.  Ejection fraction is preserved.  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 She subsequently underwent ultrasound pelvis as it was thought that her uterine fibroid was compressing on the left iliac vein.  Ultrasound pelvis revealed endometrial mass She subsequently underwent MRI of the pelvis which showed large uterine mass/fibroid.  She was evaluated by gynecologist and was placed on Megace. 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  Vascular surgery and interventional radiologist were consulted.  On 05/15/2018, she underwent the following procedures: Procedure Performed: 1.  Ultrasound-guided cannulation left popliteal vein 2.  Intravascular ultrasound left femoral vein, left common femoral vein, left common external iliac veins and IVC 3.  Stent of left common external iliac veins with 14 x 120 mm Vici 4.  Central venogram 5.  Mechanical thrombectomy of left common, external iliac veins and common femoral vein with Inari Clottriever  She was anticoagulated initially with heparin and then transition to oral anticoagulation therapy. During her hospital stay, she also received a unit of blood transfusion. With her last menstrual.,  She has been bleeding since April 30, 2018.  Recently, she passed a huge clot  with the size of a cantaloupe.  She show me pictures that she took. She [place] at the age of 32, her  usual cycle is 3 to 4 days, for cycle 28 days, usually nonheavy Over the last 6 months, she is bleeding heavier than usual, 5 to 7 days of a cycle of every 28 days She has been placed on Megace since hospitalization She has significant lower abdominal cramp She complaining of significant fatigue despite recent blood transfusion She also complained of anorexia and occasional postprandial nausea and vomiting. She also new onset of constipation.  She takes prune juice because without that, her bowel movement will be every 3 to 4 days She still have intermittent chest pain and shortness of breath but her leg swelling has mildly improved  She denies recent history of trauma, long distance travel, dehydration, recent surgery, smoking or prolonged immobilization. She had no prior history or diagnosis of cancer.  Her last Pap smear is more than 12 years ago.  She has not have any examination since her last child was born  The patient had been exposed to birth control pills and surgeries and never had thrombotic events. The patient had been pregnant before and denies history of peripartum thromboembolic event or history of recurrent miscarriages. There is no family history of blood clots or miscarriages.  I have reviewed the past medical history, past surgical history, social history and family history with the patient and they are unchanged from previous note.  ALLERGIES:  has No Known Allergies.  MEDICATIONS:  Current Outpatient Medications  Medication Sig Dispense Refill  . megestrol (MEGACE) 40 MG tablet Take 2 tablets (80 mg total) by mouth 2 (two) times daily. 60 tablet 1  . norethindrone (AYGESTIN) 5 MG tablet Take 1 tablet (5 mg total) by mouth daily. 30 tablet 2  . ondansetron (ZOFRAN) 4 MG tablet Take 1 tablet (4 mg total) by mouth every 8 (eight) hours as needed for nausea or vomiting. (Patient not taking: Reported on 06/01/2018) 20 tablet 0  . Rivaroxaban 15 & 20 MG TBPK Take as directed  on package-Start with one 15mg  tablet by mouth twice a day with food. On Day 22, switch to one 20mg    daily with food. 51 each 0   No current facility-administered medications for this visit.      REVIEW OF SYSTEMS:   Constitutional: Denies fevers, chills or night sweats Eyes: Denies blurriness of vision Ears, nose, mouth, throat, and face: Denies mucositis or sore throat Cardiovascular: Denies palpitation, chest discomfort or lower extremity swelling Gastrointestinal:  Denies nausea, heartburn or change in bowel habits Skin: Denies abnormal skin rashes Lymphatics: Denies new lymphadenopathy or easy bruising Neurological:Denies numbness, tingling or new weaknesses Behavioral/Psych: Mood is stable, no new changes  All other systems were reviewed with the patient and are negative.  PHYSICAL EXAMINATION: ECOG PERFORMANCE STATUS: 1 - Symptomatic but completely ambulatory  Vitals:   06/02/18 1300  BP: (!) 140/93  Pulse: 97  Resp: 18  Temp: 98.3 F (36.8 C)  SpO2: 100%   Filed Weights   06/02/18 1300  Weight: 143 lb 6.4 oz (65 kg)    GENERAL:alert, no distress and comfortable SKIN: skin color, texture, turgor are normal, no rashes or significant lesions EYES: normal, Conjunctiva are pink and non-injected, sclera clear OROPHARYNX:no exudate, no erythema and lips, buccal mucosa, and tongue normal  NECK: supple, thyroid normal size, non-tender, without nodularity LYMPH:  no palpable lymphadenopathy in the cervical, axillary or inguinal LUNGS: clear  to auscultation and percussion with normal breathing effort HEART: regular rate & rhythm and no murmurs and no lower extremity edema ABDOMEN:abdomen soft, non-tender and normal bowel sounds Musculoskeletal:no cyanosis of digits and no clubbing  NEURO: alert & oriented x 3 with fluent speech, no focal motor/sensory deficits  LABORATORY DATA:  I have reviewed the data as listed     Component Value Date/Time   NA 138 05/23/2018 1453    K 4.0 05/23/2018 1453   CL 101 05/23/2018 1453   CO2 26 05/23/2018 1453   GLUCOSE 90 05/23/2018 1453   BUN 8 05/23/2018 1453   CREATININE 0.74 05/23/2018 1453   CALCIUM 10.2 05/23/2018 1453   PROT 7.9 05/23/2018 1453   ALBUMIN 3.4 (L) 05/23/2018 1453   AST 17 05/23/2018 1453   ALT 30 05/23/2018 1453   ALKPHOS 190 (H) 05/23/2018 1453   BILITOT 0.3 05/23/2018 1453   GFRNONAA >60 05/23/2018 1453   GFRAA >60 05/23/2018 1453    No results found for: SPEP, UPEP  Lab Results  Component Value Date   WBC 10.2 06/02/2018   NEUTROABS 6.8 06/02/2018   HGB 8.0 (L) 06/02/2018   HCT 26.2 (L) 06/02/2018   MCV 90.3 06/02/2018   PLT 493 (H) 06/02/2018      Chemistry      Component Value Date/Time   NA 138 05/23/2018 1453   K 4.0 05/23/2018 1453   CL 101 05/23/2018 1453   CO2 26 05/23/2018 1453   BUN 8 05/23/2018 1453   CREATININE 0.74 05/23/2018 1453      Component Value Date/Time   CALCIUM 10.2 05/23/2018 1453   ALKPHOS 190 (H) 05/23/2018 1453   AST 17 05/23/2018 1453   ALT 30 05/23/2018 1453   BILITOT 0.3 05/23/2018 1453     I spent 15 minutes counseling the patient face to face. The total time spent in the appointment was 20 minutes and more than 50% was on counseling.   All questions were answered. The patient knows to call the clinic with any problems, questions or concerns. No barriers to learning was detected.    Heath Lark, MD 3/6/20201:40 PM

## 2018-06-02 NOTE — Assessment & Plan Note (Signed)
We discussed some of the risks, benefits, and alternatives of blood transfusions. The patient is symptomatic from anemia and the hemoglobin level is critically low.  Some of the side-effects to be expected including risks of transfusion reactions, chills, infection, syndrome of volume overload and risk of hospitalization from various reasons and the patient is willing to proceed and went ahead to sign consent today. We will proceed with 2 units of blood tomorrow She will return at the end of the month for repeat blood count check

## 2018-06-02 NOTE — Telephone Encounter (Signed)
Prepare and Type and Screen orders released.  Blood bank aware pt comes in 3/7 for 2 units of PRBCs

## 2018-06-03 ENCOUNTER — Inpatient Hospital Stay: Payer: Medicaid Other

## 2018-06-03 DIAGNOSIS — I2699 Other pulmonary embolism without acute cor pulmonale: Secondary | ICD-10-CM | POA: Diagnosis not present

## 2018-06-03 DIAGNOSIS — N939 Abnormal uterine and vaginal bleeding, unspecified: Secondary | ICD-10-CM

## 2018-06-03 DIAGNOSIS — D62 Acute posthemorrhagic anemia: Secondary | ICD-10-CM

## 2018-06-03 MED ORDER — ACETAMINOPHEN 325 MG PO TABS
650.0000 mg | ORAL_TABLET | Freq: Once | ORAL | Status: AC
  Administered 2018-06-03: 650 mg via ORAL

## 2018-06-03 MED ORDER — DIPHENHYDRAMINE HCL 25 MG PO CAPS
25.0000 mg | ORAL_CAPSULE | Freq: Once | ORAL | Status: AC
  Administered 2018-06-03: 25 mg via ORAL

## 2018-06-03 MED ORDER — SODIUM CHLORIDE 0.9% IV SOLUTION
250.0000 mL | Freq: Once | INTRAVENOUS | Status: AC
  Administered 2018-06-03: 250 mL via INTRAVENOUS
  Filled 2018-06-03: qty 250

## 2018-06-03 NOTE — Patient Instructions (Addendum)
Blood Transfusion, Adult, Care After This sheet gives you information about how to care for yourself after your procedure. Your doctor may also give you more specific instructions. If you have problems or questions, contact your doctor. Follow these instructions at home:   Take over-the-counter and prescription medicines only as told by your doctor.  Go back to your normal activities as told by your doctor.  Follow instructions from your doctor about how to take care of the area where an IV tube was put into your vein (insertion site). Make sure you: ? Wash your hands with soap and water before you change your bandage (dressing). If there is no soap and water, use hand sanitizer. ? Change your bandage as told by your doctor.  Check your IV insertion site every day for signs of infection. Check for: ? More redness, swelling, or pain. ? More fluid or blood. ? Warmth. ? Pus or a bad smell. Contact a doctor if:  You have more redness, swelling, or pain around the IV insertion site.  You have more fluid or blood coming from the IV insertion site.  Your IV insertion site feels warm to the touch.  You have pus or a bad smell coming from the IV insertion site.  Your pee (urine) turns pink, red, or brown.  You feel weak after doing your normal activities. Get help right away if:  You have signs of a serious allergic or body defense (immune) system reaction, including: ? Itchiness. ? Hives. ? Trouble breathing. ? Anxiety. ? Pain in your chest or lower back. ? Fever, flushing, and chills. ? Fast pulse. ? Rash. ? Watery poop (diarrhea). ? Throwing up (vomiting). ? Dark pee. ? Serious headache. ? Dizziness. ? Stiff neck. ? Yellow color in your face or the white parts of your eyes (jaundice). Summary  After a blood transfusion, return to your normal activities as told by your doctor.  Every day, check for signs of infection where the IV tube was put into your vein.  Some  signs of infection are warm skin, more redness and pain, more fluid or blood, and pus or a bad smell where the needle went in.  Contact your doctor if you feel weak or have any unusual symptoms. This information is not intended to replace advice given to you by your health care provider. Make sure you discuss any questions you have with your health care provider. Document Released: 04/05/2014 Document Revised: 11/07/2015 Document Reviewed: 11/07/2015 Elsevier Interactive Patient Education  2019 [employer]  

## 2018-06-04 LAB — TYPE AND SCREEN
ABO/RH(D): B POS
Antibody Screen: POSITIVE
UNIT DIVISION: 0
Unit division: 0

## 2018-06-04 LAB — BPAM RBC
Blood Product Expiration Date: 202003272359
Blood Product Expiration Date: 202003272359
ISSUE DATE / TIME: 202003070835
ISSUE DATE / TIME: 202003070835
Unit Type and Rh: 7300
Unit Type and Rh: 7300

## 2018-06-07 LAB — CYTOLOGY - PAP
Chlamydia: NEGATIVE
Diagnosis: NEGATIVE
Diagnosis: REACTIVE
HPV 16/18/45 genotyping: NEGATIVE
HPV: DETECTED — AB
Neisseria Gonorrhea: NEGATIVE

## 2018-06-16 ENCOUNTER — Other Ambulatory Visit: Payer: Self-pay

## 2018-06-16 DIAGNOSIS — R609 Edema, unspecified: Secondary | ICD-10-CM

## 2018-06-19 ENCOUNTER — Ambulatory Visit (HOSPITAL_COMMUNITY)
Admission: RE | Admit: 2018-06-19 | Discharge: 2018-06-19 | Disposition: A | Discharge status: Home / Self Care | Payer: Medicaid Other | Source: Ambulatory Visit | Attending: Family | Admitting: Family

## 2018-06-19 ENCOUNTER — Other Ambulatory Visit: Payer: Self-pay

## 2018-06-19 DIAGNOSIS — R609 Edema, unspecified: Secondary | ICD-10-CM | POA: Insufficient documentation

## 2018-06-20 ENCOUNTER — Other Ambulatory Visit: Payer: Self-pay

## 2018-06-20 ENCOUNTER — Encounter: Payer: Self-pay | Admitting: Vascular Surgery

## 2018-06-20 ENCOUNTER — Ambulatory Visit (INDEPENDENT_AMBULATORY_CARE_PROVIDER_SITE_OTHER): Payer: Medicaid Other | Admitting: Vascular Surgery

## 2018-06-20 DIAGNOSIS — I871 Compression of vein: Secondary | ICD-10-CM

## 2018-06-20 NOTE — Progress Notes (Signed)
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  On follow-up today she reports that her left leg swelling significantly improved.  She is taking her Xarelto as scheduled.  Still feels somewhat SOB at times given hx PE.  She states she is scheduled for a biopsy of her uterus later this week and has very large fibroids as noted by OB/GYN in their consultation.  Past Medical History:  Diagnosis Date  . Fibroids 05/11/2018  . Pulmonary emboli [facility])     Past Surgical History:  Procedure Laterality Date  . DILATION AND CURETTAGE OF UTERUS    . INTRAVASCULAR ULTRASOUND/IVUS N/A 05/15/2018   Procedure: INTRAVASCULAR ULTRASOUND/IVUS;  Surgeon: Waynetta Sandy, MD;  Location: [place] [facility];  Service: Cardiovascular;  Laterality: N/A;  . PERIPHERAL VASCULAR THROMBECTOMY N/A 05/15/2018   Procedure: PERIPHERAL VASCULAR THROMBECTOMY;  Surgeon: Waynetta Sandy, MD;  Location: [place] [facility];  Service: Cardiovascular;  Laterality: N/A;  . TUBAL LIGATION      Family History  Problem Relation Age of Onset  . Pulmonary embolism Brother   . Diabetes Maternal Grandmother     SOCIAL HISTORY: Social History   Tobacco Use  . Smoking status: Never Smoker  . Smokeless tobacco: Never Used  Substance Use Topics  . Alcohol use: Yes    No Known Allergies  Current Outpatient Medications  Medication Sig Dispense Refill  . megestrol (MEGACE) 40 MG tablet Take 2 tablets (80 mg total) by mouth 2 (two) times daily. 60 tablet 1  . norethindrone (AYGESTIN) 5 MG tablet Take 1 tablet (5 mg total) by mouth daily. 30 tablet 2  .  ondansetron (ZOFRAN) 4 MG tablet Take 1 tablet (4 mg total) by mouth every 8 (eight) hours as needed for nausea or vomiting. 20 tablet 0  . Rivaroxaban 15 & 20 MG TBPK Take as directed on package-Start with one 15mg  tablet by mouth twice a day with food. On Day 22, switch to one 20mg    daily with food. 51 each 0   No current facility-administered medications for this visit.     REVIEW OF SYSTEMS:  [X]  denotes positive finding, [ ]  denotes negative finding Cardiac  Comments:  Chest pain or chest pressure:    Shortness of breath upon exertion:    Short of breath when lying flat:    Irregular heart rhythm:        Vascular    Pain in calf, thigh, or hip brought on by ambulation:    Pain in feet at night that wakes you up from your sleep:     Blood clot in your veins:    Leg swelling:         Pulmonary    Oxygen at home:    Productive cough:     Wheezing:         Neurologic    Sudden weakness in arms or legs:     Sudden numbness in arms or legs:     Sudden onset of difficulty speaking or slurred speech:    Temporary loss of vision in one eye:     Problems with dizziness:  Gastrointestinal    Blood in stool:     Vomited blood:         Genitourinary    Burning when urinating:     Blood in urine:        Psychiatric    Major depression:         Hematologic    Bleeding problems:    Problems with blood clotting too easily:        Skin    Rashes or ulcers:        Constitutional    Fever or chills:      PHYSICAL EXAM: Vitals:   06/20/18 0859  BP: 117/73  Pulse: 99  Resp: 14  Temp: (!) 97.1 F (36.2 C)  TempSrc: Oral  SpO2: 100%  Weight: 132 lb (59.9 kg)  Height: 5\' 3"  (1.6 m)    GENERAL: The patient is a well-nourished female, in no acute distress. The vital signs are documented above. CARDIAC: There is a regular rate and rhythm.  VASCULAR:  Left groin c/d/i, no masses No significant left leg swelling. PULMONARY: There is good air exchange  bilaterally without wheezing or rales. ABDOMEN: Soft and non-tender with normal pitched bowel sounds.  MUSCULOSKELETAL: There are no major deformities or cyanosis.   DATA:   I reviewed her IVC iliac vein duplex that shows patent left common and external iliacs vein stents.  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  Stents are patent on duplex today.  She will remain on Xarelto and is being followed by hem-onc.  I will have her come back in 6 months with a repeat IVC iliac vein duplex for ongoing surveillance.  Discussed that she call with any questions or concerns.   Marty Heck, MD [facility] Office: [phone] Pager: [phone]

## 2018-06-22 ENCOUNTER — Other Ambulatory Visit: Payer: Self-pay

## 2018-06-22 ENCOUNTER — Other Ambulatory Visit (HOSPITAL_COMMUNITY)
Admission: RE | Admit: 2018-06-22 | Discharge: 2018-06-22 | Disposition: A | Discharge status: Home / Self Care | Payer: Medicaid Other | Source: Ambulatory Visit | Attending: Obstetrics and Gynecology | Admitting: Obstetrics and Gynecology

## 2018-06-22 ENCOUNTER — Encounter: Payer: Self-pay | Admitting: Obstetrics and Gynecology

## 2018-06-22 ENCOUNTER — Ambulatory Visit (INDEPENDENT_AMBULATORY_CARE_PROVIDER_SITE_OTHER): Payer: Medicaid Other | Admitting: Obstetrics and Gynecology

## 2018-06-22 ENCOUNTER — Telehealth: Payer: Self-pay | Admitting: Obstetrics and Gynecology

## 2018-06-22 VITALS — BP 122/81 | HR 89 | Ht 63.0 in | Wt 135.4 lb

## 2018-06-22 DIAGNOSIS — N939 Abnormal uterine and vaginal bleeding, unspecified: Secondary | ICD-10-CM | POA: Insufficient documentation

## 2018-06-22 DIAGNOSIS — I2699 Other pulmonary embolism without acute cor pulmonale: Secondary | ICD-10-CM | POA: Diagnosis not present

## 2018-06-22 LAB — POCT PREGNANCY, URINE: Preg Test, Ur: NEGATIVE

## 2018-06-22 MED ORDER — NORETHINDRONE ACETATE 5 MG PO TABS: 10.0000 mg | ORAL_TABLET | Freq: Every day | ORAL | 2 refills | Status: DC

## 2018-06-22 NOTE — Progress Notes (Signed)
ENDOMETRIAL BIOPSY      Holly Graham is a 38 y.o. [id number] here for endometrial biopsy.  The indications for endometrial biopsy were reviewed.  Risks of the biopsy including cramping, bleeding, infection, uterine perforation, inadequate specimen and need for additional procedures were discussed. The patient states she understands and agrees to undergo procedure today. Consent was signed. Time out was performed.   Indications: AUB Urine HCG: negative  A bivalve speculum was placed into the vagina and the cervix was easily visualized and was prepped with Betadine x2. A single-toothed tenaculum was placed on the anterior lip of the cervix to stabilize it. The 3 mm pipelle was introduced into the endometrial cavity without difficulty to a depth of 10 cm, and a large amount of tissue was obtained and sent to pathology. This was repeated for a total of 4 passes. The instruments were removed from the patient's vagina. Minimal bleeding from the cervix at the tenaculum was noted.   The patient tolerated the procedure well. Routine post-procedure instructions were given to the patient.     Feliz Beam, M.D. Attending [facility] for [employer] [facility])

## 2018-06-22 NOTE — Telephone Encounter (Signed)
Informed patients of the restrictions.

## 2018-06-23 ENCOUNTER — Encounter: Payer: Self-pay | Admitting: *Deleted

## 2018-06-26 ENCOUNTER — Inpatient Hospital Stay: Payer: Medicaid Other

## 2018-06-26 ENCOUNTER — Encounter: Payer: Self-pay | Admitting: Pharmacist

## 2018-06-26 ENCOUNTER — Inpatient Hospital Stay (HOSPITAL_BASED_OUTPATIENT_CLINIC_OR_DEPARTMENT_OTHER): Payer: Medicaid Other | Admitting: Hematology and Oncology

## 2018-06-26 ENCOUNTER — Other Ambulatory Visit: Payer: Self-pay

## 2018-06-26 ENCOUNTER — Encounter: Payer: Self-pay | Admitting: Hematology and Oncology

## 2018-06-26 DIAGNOSIS — I2699 Other pulmonary embolism without acute cor pulmonale: Secondary | ICD-10-CM

## 2018-06-26 DIAGNOSIS — Z7901 Long term (current) use of anticoagulants: Secondary | ICD-10-CM | POA: Diagnosis not present

## 2018-06-26 DIAGNOSIS — D62 Acute posthemorrhagic anemia: Secondary | ICD-10-CM

## 2018-06-26 DIAGNOSIS — N858 Other specified noninflammatory disorders of uterus: Secondary | ICD-10-CM

## 2018-06-26 DIAGNOSIS — N939 Abnormal uterine and vaginal bleeding, unspecified: Secondary | ICD-10-CM

## 2018-06-26 LAB — CBC WITH DIFFERENTIAL/PLATELET
Abs Immature Granulocytes: 0.02 10*3/uL (ref 0.00–0.07)
BASOS ABS: 0 10*3/uL (ref 0.0–0.1)
Basophils Relative: 0 %
EOS PCT: 1 %
Eosinophils Absolute: 0.1 10*3/uL (ref 0.0–0.5)
HCT: 23.5 % — ABNORMAL LOW (ref 36.0–46.0)
Hemoglobin: 7.1 g/dL — ABNORMAL LOW (ref 12.0–15.0)
Immature Granulocytes: 0 %
Lymphocytes Relative: 30 %
Lymphs Abs: 2.2 10*3/uL (ref 0.7–4.0)
MCH: 26.6 pg (ref 26.0–34.0)
MCHC: 30.2 g/dL (ref 30.0–36.0)
MCV: 88 fL (ref 80.0–100.0)
Monocytes Absolute: 0.5 10*3/uL (ref 0.1–1.0)
Monocytes Relative: 7 %
Neutro Abs: 4.4 10*3/uL (ref 1.7–7.7)
Neutrophils Relative %: 62 %
PLATELETS: 444 10*3/uL — AB (ref 150–400)
RBC: 2.67 MIL/uL — ABNORMAL LOW (ref 3.87–5.11)
RDW: 18.3 % — ABNORMAL HIGH (ref 11.5–15.5)
WBC: 7.2 10*3/uL (ref 4.0–10.5)
nRBC: 0 % (ref 0.0–0.2)

## 2018-06-26 LAB — SAMPLE TO BLOOD BANK

## 2018-06-26 NOTE — Assessment & Plan Note (Signed)
She had recent biopsy, result pending.  I will call her as soon as result is available

## 2018-06-26 NOTE — Progress Notes (Signed)
Oral Chemotherapy Pharmacist Encounter  Received call from MD that patient has 1 remaining Xarelto 20mg  dose that has been prescribed secondary to pulmonary embolism diagnosed during admission 05/08/2018. Patient was discharged on Xarelto at that time.  Patient is uninsured and has already used Radiation protection practitioner for a free 1 month supply of Xarelto. Financial advocate at [facility] is assisting with manufacturer assistance application.  Dispensed samples to patient:  Medication: Xarelto 10mg  tablets Instructions: Take 2 tablets (20mg ) by mouth once daily with largest meal of the day Quantity dispensed: 14 Days supply: 7 Manufacturer: [employer] Lot: [id number] Exp: 01/2019  Johny Drilling, PharmD, BCPS, BCOP  06/26/2018 12:49 PM [facility] [phone]

## 2018-06-26 NOTE — Assessment & Plan Note (Signed)
Her vaginal bleeding has stopped Even though she is profoundly anemic, she is not symptomatic I recommend her to take oral iron supplement as tolerated Hopefully, her blood count will improve gradually over the next few weeks I plan to see her back again next week and transfuse as needed

## 2018-06-26 NOTE — Progress Notes (Signed)
[name] OFFICE PROGRESS NOTE  [name], NP  ASSESSMENT & PLAN:  Bilateral pulmonary embolism ([place]) I have reviewed with the patient about the plan for care for severe DVT/PE.  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  I am also concerned for possible undiagnosed uterine malignancy. There is no benefit of ordering thrombophilic disorder in this situation She is appropriately anticoagulated at this point. She has appointment to see vascular surgery for further management. She needs to remain on anticoagulation therapy for minimum 1 year She is running out of prescription Xarelto and is not able to pay for it I will get her financial assistant and help her complete patient assistant payment form I plan to see her back next week for further follow-up If she is not able to get Xarelto refilled, we will have to switch her to warfarin   Uterine mass She had recent biopsy, result pending.  I will call her as soon as result is available  Anemia associated with acute blood loss Her vaginal bleeding has stopped Even though she is profoundly anemic, she is not symptomatic I recommend her to take oral iron supplement as tolerated Hopefully, her blood count will improve gradually over the next few weeks I plan to see her back again next week and transfuse as needed   No orders of the defined types were placed in this encounter.   INTERVAL HISTORY: Holly Graham 38 y.o. female returns for further follow-up. She underwent endometrial biopsy last week, result pending She has stopped bleeding over the last few days She complains of fatigue but denies dizziness, chest pain or excessive shortness of breath She has difficulties getting Xarelto refill due to lack of insurance She is wondering about work  SUMMARY OF HEMATOLOGIC HISTORY:  Holly Graham 38 y.o.  female is here because of recent diagnosis of DVT and PE  She initially presented to the emergency department at the end of January for symptom of chest pain but subsequently left without further evaluation She represented again to the emergency department on May 08, 2018 and was admitted and hospitalized until 05/18/2018. Her presentation was associated with chest pain, shortness of breath and leg swelling According to her, she has been having chest pain and shortness of breath since Christmas.  Her leg swelling was new since after the new year  She underwent multiple investigation including CT angiogram on 05/08/2018 which revealed bilateral PE with acute right ventricular strain. Urgent echocardiogram did not reveal any signs of heart damage.  Ejection fraction is preserved.  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 She subsequently underwent ultrasound pelvis as it was thought that her uterine fibroid was compressing on the left iliac vein.  Ultrasound pelvis revealed endometrial mass She subsequently underwent MRI of the pelvis which showed large uterine mass/fibroid.  She was evaluated by gynecologist and was placed on Megace. 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  Vascular surgery and interventional radiologist were consulted.  On 05/15/2018, she underwent the following procedures: Procedure Performed: 1.  Ultrasound-guided cannulation left popliteal vein 2.  Intravascular ultrasound left femoral vein, left common femoral vein, left common external iliac veins and IVC 3.  Stent of left common external iliac veins with 14 x 120 mm Vici 4.  Central venogram 5.  Mechanical thrombectomy of left common, external iliac veins and common femoral vein with Inari Clottriever  She was anticoagulated  initially with heparin and then transition to oral anticoagulation therapy. During her hospital stay, she also received a unit of blood transfusion. With her last  menstrual.,  She has been bleeding since April 30, 2018.  Recently, she passed a huge clot with the size of a cantaloupe.  She show me pictures that she took. She [place] at the age of 89, her usual cycle is 3 to 4 days, for cycle 28 days, usually nonheavy Over the last 6 months, she is bleeding heavier than usual, 5 to 7 days of a cycle of every 28 days She has been placed on Megace since hospitalization She has significant lower abdominal cramp She complaining of significant fatigue despite recent blood transfusion She also complained of anorexia and occasional postprandial nausea and vomiting. She also new onset of constipation.  She takes prune juice because without that, her bowel movement will be every 3 to 4 days She still have intermittent chest pain and shortness of breath but her leg swelling has mildly improved  She denies recent history of trauma, long distance travel, dehydration, recent surgery, smoking or prolonged immobilization. She had no prior history or diagnosis of cancer.  Her last Pap smear is more than 12 years ago.  She has not have any examination since her last child was born  The patient had been exposed to birth control pills and surgeries and never had thrombotic events. The patient had been pregnant before and denies history of peripartum thromboembolic event or history of recurrent miscarriages. There is no family history of blood clots or miscarriages. From June 22, 2018, she underwent endometrial biopsy  I have reviewed the past medical history, past surgical history, social history and family history with the patient and they are unchanged from previous note.  ALLERGIES:  has No Known Allergies.  MEDICATIONS:  Current Outpatient Medications  Medication Sig Dispense Refill  . megestrol (MEGACE) 40 MG tablet Take 2 tablets (80 mg total) by mouth 2 (two) times daily. 60 tablet 1  . norethindrone (AYGESTIN) 5 MG tablet Take 2 tablets (10 mg total) by mouth  daily. 30 tablet 2  . ondansetron (ZOFRAN) 4 MG tablet Take 1 tablet (4 mg total) by mouth every 8 (eight) hours as needed for nausea or vomiting. (Patient not taking: Reported on 06/22/2018) 20 tablet 0  . Rivaroxaban 15 & 20 MG TBPK Take as directed on package-Start with one 15mg  tablet by mouth twice a day with food. On Day 22, switch to one 20mg    daily with food. 51 each 0   No current facility-administered medications for this visit.      REVIEW OF SYSTEMS:   Constitutional: Denies fevers, chills or night sweats Eyes: Denies blurriness of vision Ears, nose, mouth, throat, and face: Denies mucositis or sore throat Respiratory: Denies cough, dyspnea or wheezes Cardiovascular: Denies palpitation, chest discomfort or lower extremity swelling Gastrointestinal:  Denies nausea, heartburn or change in bowel habits Skin: Denies abnormal skin rashes Lymphatics: Denies new lymphadenopathy or easy bruising Neurological:Denies numbness, tingling or new weaknesses Behavioral/Psych: Mood is stable, no new changes  All other systems were reviewed with the patient and are negative.  PHYSICAL EXAMINATION: ECOG PERFORMANCE STATUS: 1 - Symptomatic but completely ambulatory  Vitals:   06/26/18 1231  BP: (!) 136/96  Pulse: 85  Resp: 18  Temp: 97.9 F (36.6 C)  SpO2: 100%   Filed Weights   06/26/18 1231  Weight: 140 lb 3.2 oz (63.6 kg)    GENERAL:alert, no distress  and comfortable Musculoskeletal:no cyanosis of digits and no clubbing  NEURO: alert & oriented x 3 with fluent speech, no focal motor/sensory deficits  LABORATORY DATA:  I have reviewed the data as listed     Component Value Date/Time   NA 138 05/23/2018 1453   K 4.0 05/23/2018 1453   CL 101 05/23/2018 1453   CO2 26 05/23/2018 1453   GLUCOSE 90 05/23/2018 1453   BUN 8 05/23/2018 1453   CREATININE 0.74 05/23/2018 1453   CALCIUM 10.2 05/23/2018 1453   PROT 7.9 05/23/2018 1453   ALBUMIN 3.4 (L) 05/23/2018 1453   AST 17  05/23/2018 1453   ALT 30 05/23/2018 1453   ALKPHOS 190 (H) 05/23/2018 1453   BILITOT 0.3 05/23/2018 1453   GFRNONAA >60 05/23/2018 1453   GFRAA >60 05/23/2018 1453    No results found for: SPEP, UPEP  Lab Results  Component Value Date   WBC 7.2 06/26/2018   NEUTROABS 4.4 06/26/2018   HGB 7.1 (L) 06/26/2018   HCT 23.5 (L) 06/26/2018   MCV 88.0 06/26/2018   PLT 444 (H) 06/26/2018      Chemistry      Component Value Date/Time   NA 138 05/23/2018 1453   K 4.0 05/23/2018 1453   CL 101 05/23/2018 1453   CO2 26 05/23/2018 1453   BUN 8 05/23/2018 1453   CREATININE 0.74 05/23/2018 1453      Component Value Date/Time   CALCIUM 10.2 05/23/2018 1453   ALKPHOS 190 (H) 05/23/2018 1453   AST 17 05/23/2018 1453   ALT 30 05/23/2018 1453   BILITOT 0.3 05/23/2018 1453      I spent 15 minutes counseling the patient face to face. The total time spent in the appointment was 20 minutes and more than 50% was on counseling.   All questions were answered. The patient knows to call the clinic with any problems, questions or concerns. No barriers to learning was detected.    Heath Lark, MD 3/30/20202:10 PM

## 2018-06-26 NOTE — Assessment & Plan Note (Signed)
I have reviewed with the patient about the plan for care for severe DVT/PE.  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  I am also concerned for possible undiagnosed uterine malignancy. There is no benefit of ordering thrombophilic disorder in this situation She is appropriately anticoagulated at this point. She has appointment to see vascular surgery for further management. She needs to remain on anticoagulation therapy for minimum 1 year She is running out of prescription Xarelto and is not able to pay for it I will get her financial assistant and help her complete patient assistant payment form I plan to see her back next week for further follow-up If she is not able to get Xarelto refilled, we will have to switch her to warfarin

## 2018-07-05 ENCOUNTER — Inpatient Hospital Stay (HOSPITAL_BASED_OUTPATIENT_CLINIC_OR_DEPARTMENT_OTHER): Payer: Medicaid Other | Admitting: Medical

## 2018-07-05 ENCOUNTER — Ambulatory Visit: Payer: Medicaid Other

## 2018-07-05 ENCOUNTER — Encounter: Payer: Self-pay | Admitting: Hematology and Oncology

## 2018-07-05 ENCOUNTER — Other Ambulatory Visit: Payer: Self-pay

## 2018-07-05 ENCOUNTER — Other Ambulatory Visit: Payer: Self-pay | Admitting: Hematology and Oncology

## 2018-07-05 ENCOUNTER — Inpatient Hospital Stay: Payer: Medicaid Other | Attending: Hematology and Oncology | Admitting: Hematology and Oncology

## 2018-07-05 ENCOUNTER — Inpatient Hospital Stay: Payer: Medicaid Other

## 2018-07-05 VITALS — BP 128/87 | HR 93 | Temp 98.4°F | Resp 18 | Ht 63.0 in | Wt 140.2 lb

## 2018-07-05 VITALS — BP 111/73 | HR 100 | Temp 98.6°F | Resp 20

## 2018-07-05 DIAGNOSIS — D500 Iron deficiency anemia secondary to blood loss (chronic): Secondary | ICD-10-CM

## 2018-07-05 DIAGNOSIS — T8090XA Unspecified complication following infusion and therapeutic injection, initial encounter: Secondary | ICD-10-CM

## 2018-07-05 DIAGNOSIS — I82422 Acute embolism and thrombosis of left iliac vein: Secondary | ICD-10-CM | POA: Insufficient documentation

## 2018-07-05 DIAGNOSIS — D62 Acute posthemorrhagic anemia: Secondary | ICD-10-CM

## 2018-07-05 DIAGNOSIS — I2699 Other pulmonary embolism without acute cor pulmonale: Secondary | ICD-10-CM

## 2018-07-05 DIAGNOSIS — Z7901 Long term (current) use of anticoagulants: Secondary | ICD-10-CM | POA: Insufficient documentation

## 2018-07-05 DIAGNOSIS — N939 Abnormal uterine and vaginal bleeding, unspecified: Secondary | ICD-10-CM

## 2018-07-05 LAB — CBC WITH DIFFERENTIAL/PLATELET
Abs Immature Granulocytes: 0.02 10*3/uL (ref 0.00–0.07)
Basophils Absolute: 0 10*3/uL (ref 0.0–0.1)
Basophils Relative: 0 %
Eosinophils Absolute: 0.1 10*3/uL (ref 0.0–0.5)
Eosinophils Relative: 1 %
HCT: 24 % — ABNORMAL LOW (ref 36.0–46.0)
Hemoglobin: 7.1 g/dL — ABNORMAL LOW (ref 12.0–15.0)
Immature Granulocytes: 0 %
Lymphocytes Relative: 26 %
Lymphs Abs: 1.7 10*3/uL (ref 0.7–4.0)
MCH: 25.4 pg — ABNORMAL LOW (ref 26.0–34.0)
MCHC: 29.6 g/dL — ABNORMAL LOW (ref 30.0–36.0)
MCV: 86 fL (ref 80.0–100.0)
Monocytes Absolute: 0.7 10*3/uL (ref 0.1–1.0)
Monocytes Relative: 10 %
Neutro Abs: 4.2 10*3/uL (ref 1.7–7.7)
Neutrophils Relative %: 63 %
Platelets: 358 10*3/uL (ref 150–400)
RBC: 2.79 MIL/uL — ABNORMAL LOW (ref 3.87–5.11)
RDW: 18.6 % — ABNORMAL HIGH (ref 11.5–15.5)
WBC: 6.8 10*3/uL (ref 4.0–10.5)
nRBC: 0.4 % — ABNORMAL HIGH (ref 0.0–0.2)

## 2018-07-05 LAB — GLUCOSE, CAPILLARY: Glucose-Capillary: 92 mg/dL (ref 70–99)

## 2018-07-05 LAB — SAMPLE TO BLOOD BANK

## 2018-07-05 MED ORDER — DEXAMETHASONE SODIUM PHOSPHATE 10 MG/ML IJ SOLN
10.0000 mg | Freq: Once | INTRAMUSCULAR | Status: AC
  Administered 2018-07-05: 10 mg via INTRAVENOUS

## 2018-07-05 MED ORDER — SODIUM CHLORIDE 0.9 % IV SOLN
Freq: Once | INTRAVENOUS | Status: AC
  Administered 2018-07-05: 10:00:00 via INTRAVENOUS
  Filled 2018-07-05: qty 250

## 2018-07-05 MED ORDER — FAMOTIDINE IN NACL 20-0.9 MG/50ML-% IV SOLN
20.0000 mg | Freq: Once | INTRAVENOUS | Status: AC
  Administered 2018-07-05: 12:00:00 20 mg via INTRAVENOUS

## 2018-07-05 MED ORDER — SODIUM CHLORIDE 0.9 % IV SOLN
510.0000 mg | Freq: Once | INTRAVENOUS | Status: AC
  Administered 2018-07-05: 510 mg via INTRAVENOUS
  Filled 2018-07-05: qty 17

## 2018-07-05 MED ORDER — DEXAMETHASONE SODIUM PHOSPHATE 10 MG/ML IJ SOLN
INTRAMUSCULAR | Status: AC
  Filled 2018-07-05: qty 1

## 2018-07-05 MED ORDER — SODIUM CHLORIDE 0.9 % IV SOLN: 10.0000 mg | Freq: Once | INTRAVENOUS | Status: DC

## 2018-07-05 MED ORDER — WARFARIN SODIUM 4 MG PO TABS: 4.0000 mg | ORAL_TABLET | Freq: Every day | ORAL | 11 refills | Status: DC

## 2018-07-05 NOTE — Assessment & Plan Note (Signed)
Unfortunately, she was not able to afford Xarelto without payment assistance Her last dose taken is today We discussed risks, benefits, side effects of warfarin therapy I educated her about potential drug/food interaction and drug/drug interaction with warfarin I will start her at 4 mg daily She will return next week for blood count monitoring and dose adjustments The goal of treatment is to get her INR between 2-3 She will need to be on warfarin therapy for minimum 6 months, potentially longer We will interrupt her treatment when she is ready to proceed with hysterectomy in the future

## 2018-07-05 NOTE — Assessment & Plan Note (Signed)
Her anemia is persistent but her recent uterine bleeding has stopped She has fatigue She does not need blood transfusion The most likely cause of her anemia is due to chronic blood loss/malabsorption syndrome. We discussed some of the risks, benefits, and alternatives of intravenous iron infusions. The patient is symptomatic from anemia and the iron level is critically low. She tolerated oral iron supplement poorly and desires to achieved higher levels of iron faster for adequate hematopoesis. Some of the side-effects to be expected including risks of infusion reactions, phlebitis, headaches, nausea and fatigue.  The patient is willing to proceed. Patient education material was dispensed.  Goal is to keep ferritin level greater than 50 and normalization of anemia

## 2018-07-05 NOTE — Patient Instructions (Signed)

## 2018-07-05 NOTE — Progress Notes (Signed)
[facility] [facility] OFFICE PROGRESS NOTE  [name], NP  ASSESSMENT & PLAN:  Bilateral pulmonary embolism ([place]) Unfortunately, she was not able to afford Xarelto without payment assistance Her last dose taken is today We discussed risks, benefits, side effects of warfarin therapy I educated her about potential drug/food interaction and drug/drug interaction with warfarin I will start her at 4 mg daily She will return next week for blood count monitoring and dose adjustments The goal of treatment is to get her INR between 2-3 She will need to be on warfarin therapy for minimum 6 months, potentially longer We will interrupt her treatment when she is ready to proceed with hysterectomy in the future  Anemia associated with acute blood loss Her anemia is persistent but her recent uterine bleeding has stopped She has fatigue She does not need blood transfusion The most likely cause of her anemia is due to chronic blood loss/malabsorption syndrome. We discussed some of the risks, benefits, and alternatives of intravenous iron infusions. The patient is symptomatic from anemia and the iron level is critically low. She tolerated oral iron supplement poorly and desires to achieved higher levels of iron faster for adequate hematopoesis. Some of the side-effects to be expected including risks of infusion reactions, phlebitis, headaches, nausea and fatigue.  The patient is willing to proceed. Patient education material was dispensed.  Goal is to keep ferritin level greater than 50 and normalization of anemia     Orders Placed This Encounter  Procedures  . Protime-INR    Standing Status:   Standing    Number of Occurrences:   22    Standing Expiration Date:   07/06/2019    INTERVAL HISTORY: Holly Graham 38 y.o. female returns for further follow-up and discussion about plan of care Her recent endometrial biopsy came back benign She has stopped menstruating She complained of  excessive fatigue Denies recent chest pain, cough or dizziness She ran out of Xarelto this morning.  She is not able to afford Xarelto  SUMMARY OF HEMATOLOGIC HISTORY:  Holly Graham 38 y.o. female is here because of recent diagnosis of DVT and PE  She initially presented to the emergency department at the end of January for symptom of chest pain but subsequently left without further evaluation She represented again to the emergency department on May 08, 2018 and was admitted and hospitalized until 05/18/2018. Her presentation was associated with chest pain, shortness of breath and leg swelling According to her, she has been having chest pain and shortness of breath since Christmas.  Her leg swelling was new since after the new year  She underwent multiple investigation including CT angiogram on 05/08/2018 which revealed bilateral PE with acute right ventricular strain. Urgent echocardiogram did not reveal any signs of heart damage.  Ejection fraction is preserved.  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 She subsequently underwent ultrasound pelvis as it was thought that her uterine fibroid was compressing on the left iliac vein.  Ultrasound pelvis revealed endometrial mass She subsequently underwent MRI of the pelvis which showed large uterine mass/fibroid.  She was evaluated by gynecologist and was placed on Megace. 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  Vascular surgery and interventional radiologist were consulted.  On 05/15/2018, she underwent the following procedures: Procedure Performed: 1.  Ultrasound-guided cannulation left popliteal vein 2.  Intravascular ultrasound left femoral vein, left common femoral vein, left common external iliac veins and IVC 3.  Stent of left common external iliac veins  with 14 x 120 mm Vici 4.  Central venogram 5.  Mechanical thrombectomy of left common, external iliac veins and common femoral vein with Inari  Clottriever  She was anticoagulated initially with heparin and then transition to oral anticoagulation therapy. During her hospital stay, she also received a unit of blood transfusion. With her last menstrual.,  She has been bleeding since April 30, 2018.  Recently, she passed a huge clot with the size of a cantaloupe.  She show me pictures that she took. She [place] at the age of 51, her usual cycle is 3 to 4 days, for cycle 28 days, usually nonheavy Over the last 6 months, she is bleeding heavier than usual, 5 to 7 days of a cycle of every 28 days She has been placed on Megace since hospitalization She has significant lower abdominal cramp She complaining of significant fatigue despite recent blood transfusion She also complained of anorexia and occasional postprandial nausea and vomiting. She also new onset of constipation.  She takes prune juice because without that, her bowel movement will be every 3 to 4 days She still have intermittent chest pain and shortness of breath but her leg swelling has mildly improved  She denies recent history of trauma, long distance travel, dehydration, recent surgery, smoking or prolonged immobilization. She had no prior history or diagnosis of cancer.  Her last Pap smear is more than 12 years ago.  She has not have any examination since her last child was born  The patient had been exposed to birth control pills and surgeries and never had thrombotic events. The patient had been pregnant before and denies history of peripartum thromboembolic event or history of recurrent miscarriages. There is no family history of blood clots or miscarriages. From June 22, 2018, she underwent endometrial biopsy Biopsy [id number] Endometrium, biopsy - SCANT INACTIVE ENDOMETRIUM AND SCANT ENDOCERVICAL MUCOSA. - ABUNDANT BLOOD. - NO ATYPIA OR MALIGNANCY.  I have reviewed the past medical history, past surgical history, social history and family history with the patient  and they are unchanged from previous note.  ALLERGIES:  has No Known Allergies.  MEDICATIONS:  Current Outpatient Medications  Medication Sig Dispense Refill  . megestrol (MEGACE) 40 MG tablet Take 2 tablets (80 mg total) by mouth 2 (two) times daily. 60 tablet 1  . norethindrone (AYGESTIN) 5 MG tablet Take 2 tablets (10 mg total) by mouth daily. 30 tablet 2  . ondansetron (ZOFRAN) 4 MG tablet Take 1 tablet (4 mg total) by mouth every 8 (eight) hours as needed for nausea or vomiting. (Patient not taking: Reported on 06/22/2018) 20 tablet 0  . warfarin (COUMADIN) 4 MG tablet Take 1 tablet (4 mg total) by mouth daily. 30 tablet 11   No current facility-administered medications for this visit.      REVIEW OF SYSTEMS:   Constitutional: Denies fevers, chills or night sweats Eyes: Denies blurriness of vision Ears, nose, mouth, throat, and face: Denies mucositis or sore throat Gastrointestinal:  Denies nausea, heartburn or change in bowel habits Skin: Denies abnormal skin rashes Lymphatics: Denies new lymphadenopathy or easy bruising Neurological:Denies numbness, tingling or new weaknesses Behavioral/Psych: Mood is stable, no new changes  All other systems were reviewed with the patient and are negative.  PHYSICAL EXAMINATION: ECOG PERFORMANCE STATUS: 1 - Symptomatic but completely ambulatory  Vitals:   07/05/18 0937  BP: 128/87  Pulse: 93  Resp: 18  Temp: 98.4 F (36.9 C)  SpO2: 100%   Filed Weights   07/05/18  3007  Weight: 140 lb 3.2 oz (63.6 kg)    GENERAL:alert, no distress and comfortable Musculoskeletal:no cyanosis of digits and no clubbing  NEURO: alert & oriented x 3 with fluent speech, no focal motor/sensory deficits  LABORATORY DATA:  I have reviewed the data as listed     Component Value Date/Time   NA 138 05/23/2018 1453   K 4.0 05/23/2018 1453   CL 101 05/23/2018 1453   CO2 26 05/23/2018 1453   GLUCOSE 90 05/23/2018 1453   BUN 8 05/23/2018 1453    CREATININE 0.74 05/23/2018 1453   CALCIUM 10.2 05/23/2018 1453   PROT 7.9 05/23/2018 1453   ALBUMIN 3.4 (L) 05/23/2018 1453   AST 17 05/23/2018 1453   ALT 30 05/23/2018 1453   ALKPHOS 190 (H) 05/23/2018 1453   BILITOT 0.3 05/23/2018 1453   GFRNONAA >60 05/23/2018 1453   GFRAA >60 05/23/2018 1453    No results found for: SPEP, UPEP  Lab Results  Component Value Date   WBC 6.8 07/05/2018   NEUTROABS 4.2 07/05/2018   HGB 7.1 (L) 07/05/2018   HCT 24.0 (L) 07/05/2018   MCV 86.0 07/05/2018   PLT 358 07/05/2018      Chemistry      Component Value Date/Time   NA 138 05/23/2018 1453   K 4.0 05/23/2018 1453   CL 101 05/23/2018 1453   CO2 26 05/23/2018 1453   BUN 8 05/23/2018 1453   CREATININE 0.74 05/23/2018 1453      Component Value Date/Time   CALCIUM 10.2 05/23/2018 1453   ALKPHOS 190 (H) 05/23/2018 1453   AST 17 05/23/2018 1453   ALT 30 05/23/2018 1453   BILITOT 0.3 05/23/2018 1453      I spent 20 minutes counseling the patient face to face. The total time spent in the appointment was 25 minutes and more than 50% was on counseling.   All questions were answered. The patient knows to call the clinic with any problems, questions or concerns. No barriers to learning was detected.    Heath Lark, MD 4/9/20207:59 AM

## 2018-07-05 NOTE — Progress Notes (Addendum)
1057: Pt reports feeling short of breath during feraheme infusion. Infusion paused. NS 1L infusion started to gravity. V Tanner PA called to bedside. See flowsheet for VS.   1104 Pt reports that she still feels like she is having a hard time breathing RR 34. 02 3LNC per order from V. Tanner. CBG obtained, 92mg /dl. No further orders @ this time.   1120 Pt reports feeling better, but "feels tired" respiratory rate remains in the low 30's. All other VSS  1128 Dexamethasone 10mg  IVP Pepcid 20 mg given IVPB as order per V. Tanner PA. Pt to be challenged 15 minutes after premeds.0 2 d/c'd RR WNL  1200 VSS. Feraheme restarted w/ V. Tanner @ bedside  1217 Pt tolerated remainder of infusion well. Will continue to monitor

## 2018-07-06 ENCOUNTER — Telehealth: Payer: Self-pay | Admitting: Hematology and Oncology

## 2018-07-06 NOTE — Progress Notes (Signed)
    DATE:  07/05/2018                                          X  CHEMO/IMMUNOTHERAPY REACTION             MD:  Dr. Heath Lark   AGENT/BLOOD PRODUCT RECEIVING TODAY:              Feraheme   AGENT/BLOOD PRODUCT RECEIVING IMMEDIATELY PRIOR TO REACTION:          Feraheme   VS: BP:     111/86   P:       106       SPO2:       100% (room air)                  REACTION(S):           vasovagal reaction   PREMEDS:      none   INTERVENTION: Pepcid 20 mg IV and Decadron 10 mg IV   Review of Systems  Review of Systems  Respiratory: Negative for chest tightness and shortness of breath.   Cardiovascular: Negative for chest pain.  Neurological: Positive for light-headedness. Negative for dizziness, tremors, facial asymmetry, speech difficulty and numbness.       Presyncope     Physical Exam  Physical Exam Constitutional:      General: She is not in acute distress.    Appearance: She is not ill-appearing.  HENT:     Head: Normocephalic and atraumatic.  Cardiovascular:     Rate and Rhythm: Normal rate and regular rhythm.     Heart sounds: No murmur. No gallop.   Pulmonary:     Effort: Pulmonary effort is normal. No respiratory distress.     Breath sounds: Normal breath sounds. No wheezing or rales.     OUTCOME:                Feraheme was restarted and completed after the patient received Pepcid and Decadron.  Her symptoms abated.   Sandi Mealy, MHS, PA-C

## 2018-07-06 NOTE — Telephone Encounter (Signed)
Called regarding 4/15

## 2018-07-07 ENCOUNTER — Other Ambulatory Visit: Payer: Self-pay | Admitting: Hematology and Oncology

## 2018-07-07 ENCOUNTER — Ambulatory Visit: Payer: Self-pay | Admitting: Hematology and Oncology

## 2018-07-07 ENCOUNTER — Other Ambulatory Visit: Payer: Self-pay

## 2018-07-12 ENCOUNTER — Telehealth: Payer: Self-pay

## 2018-07-12 ENCOUNTER — Inpatient Hospital Stay: Payer: Medicaid Other

## 2018-07-12 ENCOUNTER — Other Ambulatory Visit: Payer: Self-pay

## 2018-07-12 VITALS — BP 131/91 | HR 87 | Temp 97.9°F | Resp 18

## 2018-07-12 DIAGNOSIS — I2699 Other pulmonary embolism without acute cor pulmonale: Secondary | ICD-10-CM

## 2018-07-12 DIAGNOSIS — D500 Iron deficiency anemia secondary to blood loss (chronic): Secondary | ICD-10-CM

## 2018-07-12 DIAGNOSIS — D62 Acute posthemorrhagic anemia: Secondary | ICD-10-CM

## 2018-07-12 DIAGNOSIS — N939 Abnormal uterine and vaginal bleeding, unspecified: Secondary | ICD-10-CM

## 2018-07-12 LAB — CBC WITH DIFFERENTIAL/PLATELET
Abs Immature Granulocytes: 0.15 10*3/uL — ABNORMAL HIGH (ref 0.00–0.07)
Basophils Absolute: 0 10*3/uL (ref 0.0–0.1)
Basophils Relative: 0 %
Eosinophils Absolute: 0.1 10*3/uL (ref 0.0–0.5)
Eosinophils Relative: 1 %
HCT: 26.8 % — ABNORMAL LOW (ref 36.0–46.0)
Hemoglobin: 7.8 g/dL — ABNORMAL LOW (ref 12.0–15.0)
Immature Granulocytes: 2 %
Lymphocytes Relative: 24 %
Lymphs Abs: 2.4 10*3/uL (ref 0.7–4.0)
MCH: 26.4 pg (ref 26.0–34.0)
MCHC: 29.1 g/dL — ABNORMAL LOW (ref 30.0–36.0)
MCV: 90.5 fL (ref 80.0–100.0)
Monocytes Absolute: 0.8 10*3/uL (ref 0.1–1.0)
Monocytes Relative: 7 %
Neutro Abs: 6.7 10*3/uL (ref 1.7–7.7)
Neutrophils Relative %: 66 %
Platelets: 391 10*3/uL (ref 150–400)
RBC: 2.96 MIL/uL — ABNORMAL LOW (ref 3.87–5.11)
RDW: 23.8 % — ABNORMAL HIGH (ref 11.5–15.5)
WBC: 10.2 10*3/uL (ref 4.0–10.5)
nRBC: 2.3 % — ABNORMAL HIGH (ref 0.0–0.2)

## 2018-07-12 LAB — PROTIME-INR
INR: 1.1 (ref 0.8–1.2)
Prothrombin Time: 13.7 seconds (ref 11.4–15.2)

## 2018-07-12 LAB — SAMPLE TO BLOOD BANK

## 2018-07-12 MED ORDER — DEXAMETHASONE SODIUM PHOSPHATE 10 MG/ML IJ SOLN
10.0000 mg | Freq: Once | INTRAMUSCULAR | Status: AC
  Administered 2018-07-12: 10 mg via INTRAVENOUS

## 2018-07-12 MED ORDER — SODIUM CHLORIDE 0.9 % IV SOLN
Freq: Once | INTRAVENOUS | Status: AC
  Administered 2018-07-12: 13:00:00 via INTRAVENOUS
  Filled 2018-07-12: qty 250

## 2018-07-12 MED ORDER — FAMOTIDINE IN NACL 20-0.9 MG/50ML-% IV SOLN: 20.0000 mg | Freq: Once | INTRAVENOUS | Status: DC

## 2018-07-12 MED ORDER — DEXAMETHASONE SODIUM PHOSPHATE 10 MG/ML IJ SOLN
INTRAMUSCULAR | Status: AC
  Filled 2018-07-12: qty 1

## 2018-07-12 MED ORDER — SODIUM CHLORIDE 0.9 % IV SOLN
510.0000 mg | Freq: Once | INTRAVENOUS | Status: AC
  Administered 2018-07-12: 510 mg via INTRAVENOUS
  Filled 2018-07-12: qty 17

## 2018-07-12 MED ORDER — SODIUM CHLORIDE 0.9 % IV SOLN
20.0000 mg | Freq: Once | INTRAVENOUS | Status: AC
  Administered 2018-07-12: 20 mg via INTRAVENOUS
  Filled 2018-07-12: qty 2

## 2018-07-12 MED ORDER — SODIUM CHLORIDE 0.9 % IV SOLN: 10.0000 mg | Freq: Once | INTRAVENOUS | Status: DC

## 2018-07-12 NOTE — Patient Instructions (Signed)
Coronavirus (COVID-19) Are you at risk?  Are you at risk for the Coronavirus (COVID-19)?  To be considered HIGH RISK for Coronavirus (COVID-19), you have to meet the following criteria:  . Traveled to [place], [place], [place], [place] or [place]; or in the [place] to [place], [place], [place], or [place]; and have fever, cough, and shortness of breath within the last 2 weeks of travel OR . Been in close contact with a person diagnosed with COVID-19 within the last 2 weeks and have fever, cough, and shortness of breath . IF YOU DO NOT MEET THESE CRITERIA, YOU ARE CONSIDERED LOW RISK FOR COVID-19.  What to do if you are HIGH RISK for COVID-19?  . If you are having a medical emergency, call 911. . Seek medical care right away. Before you go to a doctor's office, urgent care or emergency department, call ahead and tell them about your recent travel, contact with someone diagnosed with COVID-19, and your symptoms. You should receive instructions from your physician's office regarding next steps of care.  . When you arrive at healthcare provider, tell the healthcare staff immediately you have returned from visiting [place], [place], [place], [place] or [place]; or traveled in the [place] to [place], [place], [place], or [place]; in the last two weeks or you have been in close contact with a person diagnosed with COVID-19 in the last 2 weeks.   . Tell the health care staff about your symptoms: fever, cough and shortness of breath. . After you have been seen by a medical provider, you will be either: o Tested for (COVID-19) and discharged home on quarantine except to seek medical care if symptoms worsen, and asked to  - Stay home and avoid contact with others until you get your results (4-5 days)  - Avoid travel on public transportation if possible (such as bus, train, or airplane) or o Sent to the Emergency Department by EMS for evaluation, COVID-19 testing, and possible  admission depending on your condition and test results.  What to do if you are LOW RISK for COVID-19?  Reduce your risk of any infection by using the same precautions used for avoiding the common cold or flu:  . Wash your hands often with soap and warm water for at least 20 seconds.  If soap and water are not readily available, use an alcohol-based hand sanitizer with at least 60% alcohol.  . If coughing or sneezing, cover your mouth and nose by coughing or sneezing into the elbow areas of your shirt or coat, into a tissue or into your sleeve (not your hands). . Avoid shaking hands with others and consider head nods or verbal greetings only. . Avoid touching your eyes, nose, or mouth with unwashed hands.  . Avoid close contact with people who are sick. . Avoid places or events with large numbers of people in one location, like concerts or sporting events. . Carefully consider travel plans you have or are making. . If you are planning any travel outside or inside the [place], visit the CDC's Travelers' Health webpage for the latest health notices. . If you have some symptoms but not all symptoms, continue to monitor at home and seek medical attention if your symptoms worsen. . If you are having a medical emergency, call 911.  ADDITIONAL HEALTHCARE OPTIONS FOR PATIENTS  [facility] / e-Visit: [email or web address]         [facility]: [phone]   Urgent   Care: [phone]                   [facility]: [phone]   Ferumoxytol injection What is this medicine? FERUMOXYTOL is an iron complex. Iron is used to make healthy red blood cells, which carry oxygen and nutrients throughout the body. This medicine is used to treat iron deficiency anemia. This medicine may be used for other purposes; ask your health care provider or pharmacist if you have questions. COMMON BRAND NAME(S): Feraheme What should I  tell my health care provider before I take this medicine? They need to know if you have any of these conditions: -anemia not caused by low iron levels -high levels of iron in the blood -magnetic resonance imaging (MRI) test scheduled -an unusual or allergic reaction to iron, other medicines, foods, dyes, or preservatives -pregnant or trying to get pregnant -breast-feeding How should I use this medicine? This medicine is for injection into a vein. It is given by a health care professional in a hospital or clinic setting. Talk to your pediatrician regarding the use of this medicine in children. Special care may be needed. Overdosage: If you think you have taken too much of this medicine contact a poison control center or emergency room at once. NOTE: This medicine is only for you. Do not share this medicine with others. What if I miss a dose? It is important not to miss your dose. Call your doctor or health care professional if you are unable to keep an appointment. What may interact with this medicine? This medicine may interact with the following medications: -other iron products This list may not describe all possible interactions. Give your health care provider a list of all the medicines, herbs, non-prescription drugs, or dietary supplements you use. Also tell them if you smoke, drink alcohol, or use illegal drugs. Some items may interact with your medicine. What should I watch for while using this medicine? Visit your doctor or healthcare professional regularly. Tell your doctor or healthcare professional if your symptoms do not start to get better or if they get worse. You may need blood work done while you are taking this medicine. You may need to follow a special diet. Talk to your doctor. Foods that contain iron include: whole grains/cereals, dried fruits, beans, or peas, leafy green vegetables, and organ meats (liver, kidney). What side effects may I notice from receiving this  medicine? Side effects that you should report to your doctor or health care professional as soon as possible: -allergic reactions like skin rash, itching or hives, swelling of the face, lips, or tongue -breathing problems -changes in blood pressure -feeling faint or lightheaded, falls -fever or chills -flushing, sweating, or hot feelings -swelling of the ankles or feet Side effects that usually do not require medical attention (report to your doctor or health care professional if they continue or are bothersome): -diarrhea -headache -nausea, vomiting -stomach pain This list may not describe all possible side effects. Call your doctor for medical advice about side effects. You may report side effects to FDA at 1-800-FDA-1088. Where should I keep my medicine? This drug is given in a hospital or clinic and will not be stored at home. NOTE: This sheet is a summary. It may not cover all possible information. If you have questions about this medicine, talk to your doctor, pharmacist, or health care provider.  2019 Elsevier/Gold Standard (2016-05-03 20:21:10)  

## 2018-07-12 NOTE — Telephone Encounter (Signed)
Given message in the infusion room per Dr. Alvy Bimler to increase Coumadin to  6 mg daily and recheck labs on Monday 4/20. She verbalized understanding. Scheduling message sent.

## 2018-07-13 ENCOUNTER — Telehealth: Payer: Self-pay | Admitting: Hematology and Oncology

## 2018-07-13 NOTE — Telephone Encounter (Signed)
Spoke with patient re 4/20 lab.

## 2018-07-17 ENCOUNTER — Telehealth: Payer: Self-pay | Admitting: Hematology and Oncology

## 2018-07-17 ENCOUNTER — Inpatient Hospital Stay: Payer: Medicaid Other

## 2018-07-17 ENCOUNTER — Other Ambulatory Visit: Payer: Self-pay | Admitting: Hematology and Oncology

## 2018-07-17 ENCOUNTER — Other Ambulatory Visit: Payer: Self-pay

## 2018-07-17 DIAGNOSIS — I2699 Other pulmonary embolism without acute cor pulmonale: Secondary | ICD-10-CM

## 2018-07-17 DIAGNOSIS — D62 Acute posthemorrhagic anemia: Secondary | ICD-10-CM | POA: Diagnosis not present

## 2018-07-17 DIAGNOSIS — N939 Abnormal uterine and vaginal bleeding, unspecified: Secondary | ICD-10-CM

## 2018-07-17 LAB — CBC WITH DIFFERENTIAL/PLATELET
Abs Immature Granulocytes: 0.06 10*3/uL (ref 0.00–0.07)
Basophils Absolute: 0 10*3/uL (ref 0.0–0.1)
Basophils Relative: 0 %
Eosinophils Absolute: 0.1 10*3/uL (ref 0.0–0.5)
Eosinophils Relative: 1 %
HCT: 31.2 % — ABNORMAL LOW (ref 36.0–46.0)
Hemoglobin: 9.1 g/dL — ABNORMAL LOW (ref 12.0–15.0)
Immature Granulocytes: 1 %
Lymphocytes Relative: 18 %
Lymphs Abs: 1.8 10*3/uL (ref 0.7–4.0)
MCH: 26.9 pg (ref 26.0–34.0)
MCHC: 29.2 g/dL — ABNORMAL LOW (ref 30.0–36.0)
MCV: 92.3 fL (ref 80.0–100.0)
Monocytes Absolute: 0.6 10*3/uL (ref 0.1–1.0)
Monocytes Relative: 6 %
Neutro Abs: 7.8 10*3/uL — ABNORMAL HIGH (ref 1.7–7.7)
Neutrophils Relative %: 74 %
Platelets: 350 10*3/uL (ref 150–400)
RBC: 3.38 MIL/uL — ABNORMAL LOW (ref 3.87–5.11)
RDW: 27.3 % — ABNORMAL HIGH (ref 11.5–15.5)
WBC: 10.3 10*3/uL (ref 4.0–10.5)
nRBC: 0.9 % — ABNORMAL HIGH (ref 0.0–0.2)

## 2018-07-17 LAB — PROTIME-INR
INR: 1.3 — ABNORMAL HIGH (ref 0.8–1.2)
Prothrombin Time: 16.3 seconds — ABNORMAL HIGH (ref 11.4–15.2)

## 2018-07-17 MED ORDER — RIVAROXABAN 20 MG PO TABS: 20.0000 mg | ORAL_TABLET | Freq: Every day | ORAL | 9 refills | Status: DC

## 2018-07-17 NOTE — Telephone Encounter (Signed)
I reviewed her blood counts with the patient She was able to secure financial assistant to get her Xarelto for the next 12 months We will discontinue warfarin therapy I will see her back in a month for further follow-up to recheck her blood count and to give her further iron infusion if needed

## 2018-07-18 ENCOUNTER — Encounter: Payer: Self-pay | Admitting: *Deleted

## 2018-07-26 ENCOUNTER — Encounter: Payer: Self-pay | Admitting: Hematology and Oncology

## 2018-07-26 ENCOUNTER — Inpatient Hospital Stay (HOSPITAL_BASED_OUTPATIENT_CLINIC_OR_DEPARTMENT_OTHER): Payer: Medicaid Other | Admitting: Hematology and Oncology

## 2018-07-26 ENCOUNTER — Other Ambulatory Visit: Payer: Self-pay

## 2018-07-26 ENCOUNTER — Telehealth: Payer: Self-pay

## 2018-07-26 ENCOUNTER — Other Ambulatory Visit: Payer: Self-pay | Admitting: Hematology and Oncology

## 2018-07-26 ENCOUNTER — Inpatient Hospital Stay: Payer: Medicaid Other

## 2018-07-26 VITALS — BP 125/83 | HR 107 | Temp 98.4°F | Resp 20 | Ht 63.0 in | Wt 137.0 lb

## 2018-07-26 DIAGNOSIS — I2699 Other pulmonary embolism without acute cor pulmonale: Secondary | ICD-10-CM

## 2018-07-26 DIAGNOSIS — N939 Abnormal uterine and vaginal bleeding, unspecified: Secondary | ICD-10-CM

## 2018-07-26 DIAGNOSIS — D62 Acute posthemorrhagic anemia: Secondary | ICD-10-CM | POA: Diagnosis not present

## 2018-07-26 DIAGNOSIS — R630 Anorexia: Secondary | ICD-10-CM

## 2018-07-26 DIAGNOSIS — I82422 Acute embolism and thrombosis of left iliac vein: Secondary | ICD-10-CM

## 2018-07-26 DIAGNOSIS — R109 Unspecified abdominal pain: Secondary | ICD-10-CM

## 2018-07-26 DIAGNOSIS — Z7901 Long term (current) use of anticoagulants: Secondary | ICD-10-CM

## 2018-07-26 DIAGNOSIS — I519 Heart disease, unspecified: Secondary | ICD-10-CM

## 2018-07-26 DIAGNOSIS — R079 Chest pain, unspecified: Secondary | ICD-10-CM

## 2018-07-26 DIAGNOSIS — K5900 Constipation, unspecified: Secondary | ICD-10-CM

## 2018-07-26 DIAGNOSIS — N858 Other specified noninflammatory disorders of uterus: Secondary | ICD-10-CM | POA: Diagnosis not present

## 2018-07-26 LAB — CBC WITH DIFFERENTIAL/PLATELET
Abs Immature Granulocytes: 0.03 10*3/uL (ref 0.00–0.07)
Basophils Absolute: 0 10*3/uL (ref 0.0–0.1)
Basophils Relative: 0 %
Eosinophils Absolute: 0.1 10*3/uL (ref 0.0–0.5)
Eosinophils Relative: 1 %
HCT: 24.2 % — ABNORMAL LOW (ref 36.0–46.0)
Hemoglobin: 7.4 g/dL — ABNORMAL LOW (ref 12.0–15.0)
Immature Granulocytes: 0 %
Lymphocytes Relative: 29 %
Lymphs Abs: 2.1 10*3/uL (ref 0.7–4.0)
MCH: 29.1 pg (ref 26.0–34.0)
MCHC: 30.6 g/dL (ref 30.0–36.0)
MCV: 95.3 fL (ref 80.0–100.0)
Monocytes Absolute: 0.5 10*3/uL (ref 0.1–1.0)
Monocytes Relative: 6 %
Neutro Abs: 4.6 10*3/uL (ref 1.7–7.7)
Neutrophils Relative %: 64 %
Platelets: 385 10*3/uL (ref 150–400)
RBC: 2.54 MIL/uL — ABNORMAL LOW (ref 3.87–5.11)
RDW: 29.1 % — ABNORMAL HIGH (ref 11.5–15.5)
WBC: 7.3 10*3/uL (ref 4.0–10.5)
nRBC: 0 % (ref 0.0–0.2)

## 2018-07-26 LAB — IRON AND TIBC
Iron: 67 ug/dL (ref 41–142)
Saturation Ratios: 24 % (ref 21–57)
TIBC: 279 ug/dL (ref 236–444)
UIBC: 212 ug/dL (ref 120–384)

## 2018-07-26 LAB — SAMPLE TO BLOOD BANK

## 2018-07-26 LAB — PREPARE RBC (CROSSMATCH)

## 2018-07-26 LAB — FERRITIN: Ferritin: 265 ng/mL (ref 11–307)

## 2018-07-26 MED ORDER — DIPHENHYDRAMINE HCL 25 MG PO CAPS
ORAL_CAPSULE | ORAL | Status: AC
  Filled 2018-07-26: qty 1

## 2018-07-26 MED ORDER — SODIUM CHLORIDE 0.9% IV SOLUTION
250.0000 mL | Freq: Once | INTRAVENOUS | Status: AC
  Administered 2018-07-26: 250 mL via INTRAVENOUS
  Filled 2018-07-26: qty 250

## 2018-07-26 MED ORDER — ACETAMINOPHEN 325 MG PO TABS
ORAL_TABLET | ORAL | Status: AC
  Filled 2018-07-26: qty 2

## 2018-07-26 MED ORDER — DIPHENHYDRAMINE HCL 25 MG PO CAPS
25.0000 mg | ORAL_CAPSULE | Freq: Once | ORAL | Status: AC
  Administered 2018-07-26: 25 mg via ORAL

## 2018-07-26 MED ORDER — ACETAMINOPHEN 325 MG PO TABS
650.0000 mg | ORAL_TABLET | Freq: Once | ORAL | Status: AC
  Administered 2018-07-26: 650 mg via ORAL

## 2018-07-26 NOTE — Progress Notes (Signed)
[facility] OFFICE PROGRESS NOTE  [name], NP  ASSESSMENT & PLAN:  Bilateral pulmonary embolism ([place]) She started bleeding heavy again on Xarelto I recommend her to call her gynecologist for further evaluation If she needs surgical intervention, I will prescribe bridging therapy  Anemia associated with acute blood loss She has severe anemia and is symptomatic with dizziness  We discussed some of the risks, benefits, and alternatives of blood transfusions. The patient is symptomatic from anemia and the hemoglobin level is critically low.  Some of the side-effects to be expected including risks of transfusion reactions, chills, infection, syndrome of volume overload and risk of hospitalization from various reasons and the patient is willing to proceed and went ahead to sign consent today. She will proceed with 1 unit of blood today I will also schedule II more doses of intravenous iron this week and next week  Uterine mass She has severe menorrhagia and have abnormal uterine mass Previous biopsy was benign  Her current medication is not helping with her menorrhagia I recommend her to call her gynecologist for further evaluation and management   Orders Placed This Encounter  Procedures  . Type and screen    Standing Status:   Future    Number of Occurrences:   1    Standing Expiration Date:   07/26/2019  . Prepare RBC    Standing Status:   Standing    Number of Occurrences:   1    Order Specific Question:   # of Units    Answer:   1 unit    Order Specific Question:   Transfusion Indications    Answer:   Symptomatic Anemia    Order Specific Question:   If emergent release call blood bank    Answer:   [facility] [phone]    INTERVAL HISTORY: Holly Graham 38 y.o. female returns for urgent evaluation due to presyncopal episode and dizziness secondary to menorrhagia Since last time I saw her, she resume Xarelto She started to have heavy  menstruation over the last 4 months with passage of large amount of clots She denies chest pain or shortness of breath  SUMMARY OF HEMATOLOGIC HISTORY:  Holly Graham 38 y.o. female is here because of recent diagnosis of DVT and PE  She initially presented to the emergency department at the end of January for symptom of chest pain but subsequently left without further evaluation She represented again to the emergency department on May 08, 2018 and was admitted and hospitalized until 05/18/2018. Her presentation was associated with chest pain, shortness of breath and leg swelling According to her, she has been having chest pain and shortness of breath since Christmas.  Her leg swelling was new since after the new year  She underwent multiple investigation including CT angiogram on 05/08/2018 which revealed bilateral PE with acute right ventricular strain. Urgent echocardiogram did not reveal any signs of heart damage.  Ejection fraction is preserved.  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 She subsequently underwent ultrasound pelvis as it was thought that her uterine fibroid was compressing on the left iliac vein.  Ultrasound pelvis revealed endometrial mass She subsequently underwent MRI of the pelvis which showed large uterine mass/fibroid.  She was evaluated by gynecologist and was placed on Megace. 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  Vascular surgery and interventional radiologist were consulted.  On 05/15/2018, she underwent the following procedures: Procedure Performed: 1.  Ultrasound-guided cannulation left popliteal vein 2.  Intravascular ultrasound left femoral vein, left common femoral vein, left common external iliac veins and IVC 3.  Stent of left common external iliac veins with 14 x 120 mm Vici 4.  Central venogram 5.  Mechanical thrombectomy of left common, external iliac veins and common femoral vein with Inari Clottriever  She  was anticoagulated initially with heparin and then transition to oral anticoagulation therapy. During her hospital stay, she also received a unit of blood transfusion. With her last menstrual.,  She has been bleeding since April 30, 2018.  Recently, she passed a huge clot with the size of a cantaloupe.  She show me pictures that she took. She [place] at the age of 12, her usual cycle is 3 to 4 days, for cycle 28 days, usually nonheavy Over the last 6 months, she is bleeding heavier than usual, 5 to 7 days of a cycle of every 28 days She has been placed on Megace since hospitalization She has significant lower abdominal cramp She complaining of significant fatigue despite recent blood transfusion She also complained of anorexia and occasional postprandial nausea and vomiting. She also new onset of constipation.  She takes prune juice because without that, her bowel movement will be every 3 to 4 days She still have intermittent chest pain and shortness of breath but her leg swelling has mildly improved  She denies recent history of trauma, long distance travel, dehydration, recent surgery, smoking or prolonged immobilization. She had no prior history or diagnosis of cancer.  Her last Pap smear is more than 12 years ago.  She has not have any examination since her last child was born  The patient had been exposed to birth control pills and surgeries and never had thrombotic events. The patient had been pregnant before and denies history of peripartum thromboembolic event or history of recurrent miscarriages. There is no family history of blood clots or miscarriages. From June 22, 2018, she underwent endometrial biopsy Biopsy [id number] Endometrium, biopsy - SCANT INACTIVE ENDOMETRIUM AND SCANT ENDOCERVICAL MUCOSA. - ABUNDANT BLOOD. - NO ATYPIA OR MALIGNANCY. She was placed on Xarelto  I have reviewed the past medical history, past surgical history, social history and family history with the  patient and they are unchanged from previous note.  ALLERGIES:  has No Known Allergies.  MEDICATIONS:  Current Outpatient Medications  Medication Sig Dispense Refill  . megestrol (MEGACE) 40 MG tablet Take 2 tablets (80 mg total) by mouth 2 (two) times daily. 60 tablet 1  . norethindrone (AYGESTIN) 5 MG tablet Take 2 tablets (10 mg total) by mouth daily. 30 tablet 2  . ondansetron (ZOFRAN) 4 MG tablet Take 1 tablet (4 mg total) by mouth every 8 (eight) hours as needed for nausea or vomiting. (Patient not taking: Reported on 06/22/2018) 20 tablet 0  . rivaroxaban (XARELTO) 20 MG TABS tablet Take 1 tablet (20 mg total) by mouth daily with supper. 30 tablet 9   No current facility-administered medications for this visit.      REVIEW OF SYSTEMS:   Constitutional: Denies fevers, chills or night sweats Eyes: Denies blurriness of vision Ears, nose, mouth, throat, and face: Denies mucositis or sore throat Respiratory: Denies cough, dyspnea or wheezes Cardiovascular: Denies palpitation, chest discomfort or lower extremity swelling Gastrointestinal:  Denies nausea, heartburn or change in bowel habits Skin: Denies abnormal skin rashes Lymphatics: Denies new lymphadenopathy or easy bruising Neurological:Denies numbness, tingling  Behavioral/Psych: Mood is stable, no new changes  All other systems were reviewed with the patient  and are negative.  PHYSICAL EXAMINATION: ECOG PERFORMANCE STATUS: 2 - Symptomatic, <50% confined to bed  Vitals:   07/26/18 1029  BP: 125/83  Pulse: (!) 107  Resp: 20  Temp: 98.4 F (36.9 C)  SpO2: 100%   Filed Weights   07/26/18 1029  Weight: 137 lb (62.1 kg)    GENERAL:alert, no distress and comfortable SKIN: skin color, texture, turgor are normal, no rashes or significant lesions EYES: normal, Conjunctiva are pale and non-injected, sclera clear NEURO: alert & oriented x 3 with fluent speech, no focal motor/sensory deficits  LABORATORY DATA:  I have  reviewed the data as listed     Component Value Date/Time   NA 138 05/23/2018 1453   K 4.0 05/23/2018 1453   CL 101 05/23/2018 1453   CO2 26 05/23/2018 1453   GLUCOSE 90 05/23/2018 1453   BUN 8 05/23/2018 1453   CREATININE 0.74 05/23/2018 1453   CALCIUM 10.2 05/23/2018 1453   PROT 7.9 05/23/2018 1453   ALBUMIN 3.4 (L) 05/23/2018 1453   AST 17 05/23/2018 1453   ALT 30 05/23/2018 1453   ALKPHOS 190 (H) 05/23/2018 1453   BILITOT 0.3 05/23/2018 1453   GFRNONAA >60 05/23/2018 1453   GFRAA >60 05/23/2018 1453    No results found for: SPEP, UPEP  Lab Results  Component Value Date   WBC 7.3 07/26/2018   NEUTROABS 4.6 07/26/2018   HGB 7.4 (L) 07/26/2018   HCT 24.2 (L) 07/26/2018   MCV 95.3 07/26/2018   PLT 385 07/26/2018      Chemistry      Component Value Date/Time   NA 138 05/23/2018 1453   K 4.0 05/23/2018 1453   CL 101 05/23/2018 1453   CO2 26 05/23/2018 1453   BUN 8 05/23/2018 1453   CREATININE 0.74 05/23/2018 1453      Component Value Date/Time   CALCIUM 10.2 05/23/2018 1453   ALKPHOS 190 (H) 05/23/2018 1453   AST 17 05/23/2018 1453   ALT 30 05/23/2018 1453   BILITOT 0.3 05/23/2018 1453      I spent 15 minutes counseling the patient face to face. The total time spent in the appointment was 20 minutes and more than 50% was on counseling.   All questions were answered. The patient knows to call the clinic with any problems, questions or concerns. No barriers to learning was detected.    Heath Lark, MD 4/29/202010:51 AM

## 2018-07-26 NOTE — Patient Instructions (Signed)
Blood Transfusion, Adult, Care After This sheet gives you information about how to care for yourself after your procedure. Your doctor may also give you more specific instructions. If you have problems or questions, contact your doctor. Follow these instructions at home:   Take over-the-counter and prescription medicines only as told by your doctor.  Go back to your normal activities as told by your doctor.  Follow instructions from your doctor about how to take care of the area where an IV tube was put into your vein (insertion site). Make sure you: ? Wash your hands with soap and water before you change your bandage (dressing). If there is no soap and water, use hand sanitizer. ? Change your bandage as told by your doctor.  Check your IV insertion site every day for signs of infection. Check for: ? More redness, swelling, or pain. ? More fluid or blood. ? Warmth. ? Pus or a bad smell. Contact a doctor if:  You have more redness, swelling, or pain around the IV insertion site.  You have more fluid or blood coming from the IV insertion site.  Your IV insertion site feels warm to the touch.  You have pus or a bad smell coming from the IV insertion site.  Your pee (urine) turns pink, red, or brown.  You feel weak after doing your normal activities. Get help right away if:  You have signs of a serious allergic or body defense (immune) system reaction, including: ? Itchiness. ? Hives. ? Trouble breathing. ? Anxiety. ? Pain in your chest or lower back. ? Fever, flushing, and chills. ? Fast pulse. ? Rash. ? Watery poop (diarrhea). ? Throwing up (vomiting). ? Dark pee. ? Serious headache. ? Dizziness. ? Stiff neck. ? Yellow color in your face or the white parts of your eyes (jaundice). Summary  After a blood transfusion, return to your normal activities as told by your doctor.  Every day, check for signs of infection where the IV tube was put into your vein.  Some  signs of infection are warm skin, more redness and pain, more fluid or blood, and pus or a bad smell where the needle went in.  Contact your doctor if you feel weak or have any unusual symptoms. This information is not intended to replace advice given to you by your health care provider. Make sure you discuss any questions you have with your health care provider. Document Released: 04/05/2014 Document Revised: 11/07/2015 Document Reviewed: 11/07/2015 Elsevier Interactive Patient Education  2019 Else[employer] Inc.   Coronavirus (COVID-19) Are you at risk?  Are you at risk for the Coronavirus (COVID-19)?  To be considered HIGH RISK for Coronavirus (COVID-19), you have to meet the following criteria:  . Traveled to [place], [place], [place], [place] or [place]; or in the [place] to [place], [place], [place], or [place]; and have fever, cough, and shortness of breath within the last 2 weeks of travel OR . Been in close contact with a person diagnosed with COVID-19 within the last 2 weeks and have fever, cough, and shortness of breath . IF YOU DO NOT MEET THESE CRITERIA, YOU ARE CONSIDERED LOW RISK FOR COVID-19.  What to do if you are HIGH RISK for COVID-19?  . If you are having a medical emergency, call 911. . Seek medical care right away. Before you go to a doctor's office, urgent care or emergency department, call ahead and tell them about your recent travel, contact with someone diagnosed with   COVID-19, and your symptoms. You should receive instructions from your physician's office regarding next steps of care.  . When you arrive at healthcare provider, tell the healthcare staff immediately you have returned from visiting [place], [place], [place], [place] or [place]; or traveled in the [place] to [place], [place], [place], or [place]; in the last two weeks or you have been in close contact with a person diagnosed with COVID-19 in the last 2 weeks.   . Tell the health care  staff about your symptoms: fever, cough and shortness of breath. . After you have been seen by a medical provider, you will be either: o Tested for (COVID-19) and discharged home on quarantine except to seek medical care if symptoms worsen, and asked to  - Stay home and avoid contact with others until you get your results (4-5 days)  - Avoid travel on public transportation if possible (such as bus, train, or airplane) or o Sent to the Emergency Department by EMS for evaluation, COVID-19 testing, and possible admission depending on your condition and test results.  What to do if you are LOW RISK for COVID-19?  Reduce your risk of any infection by using the same precautions used for avoiding the common cold or flu:  . Wash your hands often with soap and warm water for at least 20 seconds.  If soap and water are not readily available, use an alcohol-based hand sanitizer with at least 60% alcohol.  . If coughing or sneezing, cover your mouth and nose by coughing or sneezing into the elbow areas of your shirt or coat, into a tissue or into your sleeve (not your hands). . Avoid shaking hands with others and consider head nods or verbal greetings only. . Avoid touching your eyes, nose, or mouth with unwashed hands.  . Avoid close contact with people who are sick. . Avoid places or events with large numbers of people in one location, like concerts or sporting events. . Carefully consider travel plans you have or are making. . If you are planning any travel outside or inside the [place], visit the CDC's Travelers' Health webpage for the latest health notices. . If you have some symptoms but not all symptoms, continue to monitor at home and seek medical attention if your symptoms worsen. . If you are having a medical emergency, call 911.   ADDITIONAL HEALTHCARE OPTIONS FOR PATIENTS  [facility] / e-Visit: [email or web address]         [facility]:  [phone]  [facility]: [phone]                   [facility]: [phone]   

## 2018-07-26 NOTE — Telephone Encounter (Signed)
Can she come in now? Labs and see me

## 2018-07-26 NOTE — Assessment & Plan Note (Signed)
She started bleeding heavy again on Xarelto I recommend her to call her gynecologist for further evaluation If she needs surgical intervention, I will prescribe bridging therapy

## 2018-07-26 NOTE — Assessment & Plan Note (Signed)
She has severe anemia and is symptomatic with dizziness  We discussed some of the risks, benefits, and alternatives of blood transfusions. The patient is symptomatic from anemia and the hemoglobin level is critically low.  Some of the side-effects to be expected including risks of transfusion reactions, chills, infection, syndrome of volume overload and risk of hospitalization from various reasons and the patient is willing to proceed and went ahead to sign consent today. She will proceed with 1 unit of blood today I will also schedule II more doses of intravenous iron this week and next week

## 2018-07-26 NOTE — Telephone Encounter (Signed)
Called and given below message. She verbalized understanding. Will come in at 9:45 for lab and see Dr. Alvy Bimler at 10:15 today. Scheduling message sent.

## 2018-07-26 NOTE — Assessment & Plan Note (Signed)
She has severe menorrhagia and have abnormal uterine mass Previous biopsy was benign  Her current medication is not helping with her menorrhagia I recommend her to call her gynecologist for further evaluation and management

## 2018-07-26 NOTE — Telephone Encounter (Signed)
She called and left a message yesterday requesting appt this week. She is feeling weak and faint due blood loss.

## 2018-07-27 LAB — TYPE AND SCREEN
ABO/RH(D): B POS
Antibody Screen: NEGATIVE
Unit division: 0

## 2018-07-27 LAB — BPAM RBC
Blood Product Expiration Date: 202005152359
ISSUE DATE / TIME: 202004291346
Unit Type and Rh: 7300

## 2018-07-28 ENCOUNTER — Other Ambulatory Visit: Payer: Self-pay | Admitting: Hematology and Oncology

## 2018-07-28 ENCOUNTER — Other Ambulatory Visit: Payer: Self-pay

## 2018-07-28 ENCOUNTER — Inpatient Hospital Stay: Payer: Medicaid Other | Attending: Hematology and Oncology

## 2018-07-28 VITALS — BP 98/68 | HR 94 | Temp 98.4°F | Resp 18

## 2018-07-28 DIAGNOSIS — D259 Leiomyoma of uterus, unspecified: Secondary | ICD-10-CM | POA: Insufficient documentation

## 2018-07-28 DIAGNOSIS — R079 Chest pain, unspecified: Secondary | ICD-10-CM | POA: Insufficient documentation

## 2018-07-28 DIAGNOSIS — R5381 Other malaise: Secondary | ICD-10-CM | POA: Insufficient documentation

## 2018-07-28 DIAGNOSIS — R109 Unspecified abdominal pain: Secondary | ICD-10-CM | POA: Insufficient documentation

## 2018-07-28 DIAGNOSIS — D500 Iron deficiency anemia secondary to blood loss (chronic): Secondary | ICD-10-CM

## 2018-07-28 DIAGNOSIS — D62 Acute posthemorrhagic anemia: Secondary | ICD-10-CM | POA: Insufficient documentation

## 2018-07-28 DIAGNOSIS — K5900 Constipation, unspecified: Secondary | ICD-10-CM | POA: Diagnosis not present

## 2018-07-28 DIAGNOSIS — I2699 Other pulmonary embolism without acute cor pulmonale: Secondary | ICD-10-CM | POA: Diagnosis not present

## 2018-07-28 MED ORDER — FAMOTIDINE IN NACL 20-0.9 MG/50ML-% IV SOLN
INTRAVENOUS | Status: AC
  Filled 2018-07-28: qty 50

## 2018-07-28 MED ORDER — SODIUM CHLORIDE 0.9 % IV SOLN
510.0000 mg | Freq: Once | INTRAVENOUS | Status: AC
  Administered 2018-07-28: 510 mg via INTRAVENOUS
  Filled 2018-07-28: qty 17

## 2018-07-28 MED ORDER — SODIUM CHLORIDE 0.9 % IV SOLN: 10.0000 mg | Freq: Once | INTRAVENOUS | Status: DC

## 2018-07-28 MED ORDER — SODIUM CHLORIDE 0.9 % IV SOLN
Freq: Once | INTRAVENOUS | Status: AC
  Administered 2018-07-28: 09:00:00 via INTRAVENOUS
  Filled 2018-07-28: qty 250

## 2018-07-28 MED ORDER — DEXAMETHASONE SODIUM PHOSPHATE 10 MG/ML IJ SOLN
INTRAMUSCULAR | Status: AC
  Filled 2018-07-28: qty 1

## 2018-07-28 MED ORDER — DEXAMETHASONE SODIUM PHOSPHATE 10 MG/ML IJ SOLN
10.0000 mg | Freq: Once | INTRAMUSCULAR | Status: AC
  Administered 2018-07-28: 10 mg via INTRAVENOUS

## 2018-07-28 MED ORDER — FAMOTIDINE IN NACL 20-0.9 MG/50ML-% IV SOLN
20.0000 mg | Freq: Once | INTRAVENOUS | Status: AC
  Administered 2018-07-28: 20 mg via INTRAVENOUS

## 2018-07-28 NOTE — Patient Instructions (Signed)

## 2018-07-31 ENCOUNTER — Telehealth: Payer: Self-pay

## 2018-07-31 NOTE — Telephone Encounter (Signed)
Pt called and said that she was having issues with walking today and she has had pain in her left leg and thigh since yesterday with no injury.    Pt is currently on Xarelto and just recently saw hematology. Called her and advised that she should let her hematologist know what is going on and recommended that she call her PCP to evaluate her and determine if any imaging is needed from our office.   She acknowledged and will call both physicians.   York Cerise, CMA

## 2018-08-04 ENCOUNTER — Other Ambulatory Visit: Payer: Self-pay

## 2018-08-04 ENCOUNTER — Inpatient Hospital Stay: Payer: Medicaid Other

## 2018-08-04 ENCOUNTER — Telehealth: Payer: Self-pay | Admitting: Hematology and Oncology

## 2018-08-04 ENCOUNTER — Other Ambulatory Visit: Payer: Self-pay | Admitting: Hematology and Oncology

## 2018-08-04 VITALS — BP 118/81 | HR 88 | Temp 98.6°F | Resp 16

## 2018-08-04 DIAGNOSIS — I2699 Other pulmonary embolism without acute cor pulmonale: Secondary | ICD-10-CM

## 2018-08-04 DIAGNOSIS — D62 Acute posthemorrhagic anemia: Secondary | ICD-10-CM

## 2018-08-04 DIAGNOSIS — N939 Abnormal uterine and vaginal bleeding, unspecified: Secondary | ICD-10-CM

## 2018-08-04 DIAGNOSIS — D500 Iron deficiency anemia secondary to blood loss (chronic): Secondary | ICD-10-CM

## 2018-08-04 LAB — IRON AND TIBC
Iron: 121 ug/dL (ref 41–142)
Saturation Ratios: 55 % (ref 21–57)
TIBC: 223 ug/dL — ABNORMAL LOW (ref 236–444)
UIBC: 101 ug/dL — ABNORMAL LOW (ref 120–384)

## 2018-08-04 LAB — CBC WITH DIFFERENTIAL/PLATELET
Abs Immature Granulocytes: 0.05 10*3/uL (ref 0.00–0.07)
Basophils Absolute: 0 10*3/uL (ref 0.0–0.1)
Basophils Relative: 1 %
Eosinophils Absolute: 0.1 10*3/uL (ref 0.0–0.5)
Eosinophils Relative: 1 %
HCT: 29.4 % — ABNORMAL LOW (ref 36.0–46.0)
Hemoglobin: 8.9 g/dL — ABNORMAL LOW (ref 12.0–15.0)
Immature Granulocytes: 1 %
Lymphocytes Relative: 31 %
Lymphs Abs: 1.9 10*3/uL (ref 0.7–4.0)
MCH: 30.4 pg (ref 26.0–34.0)
MCHC: 30.3 g/dL (ref 30.0–36.0)
MCV: 100.3 fL — ABNORMAL HIGH (ref 80.0–100.0)
Monocytes Absolute: 0.5 10*3/uL (ref 0.1–1.0)
Monocytes Relative: 8 %
Neutro Abs: 3.5 10*3/uL (ref 1.7–7.7)
Neutrophils Relative %: 58 %
Platelets: 422 10*3/uL — ABNORMAL HIGH (ref 150–400)
RBC: 2.93 MIL/uL — ABNORMAL LOW (ref 3.87–5.11)
RDW: 26.8 % — ABNORMAL HIGH (ref 11.5–15.5)
WBC: 6.1 10*3/uL (ref 4.0–10.5)
nRBC: 0 % (ref 0.0–0.2)

## 2018-08-04 LAB — FERRITIN: Ferritin: 1455 ng/mL — ABNORMAL HIGH (ref 11–307)

## 2018-08-04 LAB — SAMPLE TO BLOOD BANK

## 2018-08-04 MED ORDER — SODIUM CHLORIDE 0.9 % IV SOLN
INTRAVENOUS | Status: DC
  Administered 2018-08-04: 13:00:00 via INTRAVENOUS
  Filled 2018-08-04: qty 250

## 2018-08-04 MED ORDER — DEXAMETHASONE SODIUM PHOSPHATE 10 MG/ML IJ SOLN
10.0000 mg | Freq: Once | INTRAMUSCULAR | Status: AC
  Administered 2018-08-04: 13:00:00 10 mg via INTRAVENOUS

## 2018-08-04 MED ORDER — HEPARIN SOD (PORK) LOCK FLUSH 100 UNIT/ML IV SOLN
250.0000 [IU] | Freq: Once | INTRAVENOUS | Status: DC | PRN
  Filled 2018-08-04: qty 5

## 2018-08-04 MED ORDER — LEUPROLIDE ACETATE (3 MONTH) 11.25 MG IM KIT
11.2500 mg | PACK | Freq: Once | INTRAMUSCULAR | Status: AC
  Administered 2018-08-04: 15:00:00 11.25 mg via INTRAMUSCULAR
  Filled 2018-08-04: qty 11.25

## 2018-08-04 MED ORDER — FAMOTIDINE IN NACL 20-0.9 MG/50ML-% IV SOLN
20.0000 mg | Freq: Once | INTRAVENOUS | Status: AC
  Administered 2018-08-04: 13:00:00 20 mg via INTRAVENOUS

## 2018-08-04 MED ORDER — SODIUM CHLORIDE 0.9% FLUSH
10.0000 mL | Freq: Once | INTRAVENOUS | Status: DC | PRN
  Filled 2018-08-04: qty 10

## 2018-08-04 MED ORDER — SODIUM CHLORIDE 0.9% FLUSH
3.0000 mL | Freq: Once | INTRAVENOUS | Status: DC | PRN
  Filled 2018-08-04: qty 10

## 2018-08-04 MED ORDER — HEPARIN SOD (PORK) LOCK FLUSH 100 UNIT/ML IV SOLN
500.0000 [IU] | Freq: Once | INTRAVENOUS | Status: DC | PRN
  Filled 2018-08-04: qty 5

## 2018-08-04 MED ORDER — DEXAMETHASONE SODIUM PHOSPHATE 10 MG/ML IJ SOLN
INTRAMUSCULAR | Status: AC
  Filled 2018-08-04: qty 1

## 2018-08-04 MED ORDER — FAMOTIDINE IN NACL 20-0.9 MG/50ML-% IV SOLN
INTRAVENOUS | Status: AC
  Filled 2018-08-04: qty 50

## 2018-08-04 MED ORDER — SODIUM CHLORIDE 0.9 % IV SOLN
510.0000 mg | Freq: Once | INTRAVENOUS | Status: AC
  Administered 2018-08-04: 14:00:00 510 mg via INTRAVENOUS
  Filled 2018-08-04: qty 510

## 2018-08-04 MED ORDER — ALTEPLASE 2 MG IJ SOLR
2.0000 mg | Freq: Once | INTRAMUSCULAR | Status: DC | PRN
  Filled 2018-08-04: qty 2

## 2018-08-04 MED ORDER — SODIUM CHLORIDE 0.9 % IV SOLN: 10.0000 mg | Freq: Once | INTRAVENOUS | Status: DC

## 2018-08-04 NOTE — Telephone Encounter (Signed)
I spoke with the patient I am concerned about her inability to get hysterectomy soon The prescribed antiestrogen pills are not stopping menstruation I have reviewed her case briefly with GYN oncologist due to concern for malignancy We discussed the use of Lupron in this situation and she agrees to proceed.  She is informed to stop Megace I will discuss with her further next week for further review about plan of care

## 2018-08-04 NOTE — Patient Instructions (Addendum)
Ferumoxytol injection What is this medicine? FERUMOXYTOL is an iron complex. Iron is used to make healthy red blood cells, which carry oxygen and nutrients throughout the body. This medicine is used to treat iron deficiency anemia. This medicine may be used for other purposes; ask your health care provider or pharmacist if you have questions. COMMON BRAND NAME(S): Feraheme What should I tell my health care provider before I take this medicine? They need to know if you have any of these conditions: -anemia not caused by low iron levels -high levels of iron in the blood -magnetic resonance imaging (MRI) test scheduled -an unusual or allergic reaction to iron, other medicines, foods, dyes, or preservatives -pregnant or trying to get pregnant -breast-feeding How should I use this medicine? This medicine is for injection into a vein. It is given by a health care professional in a hospital or clinic setting. Talk to your pediatrician regarding the use of this medicine in children. Special care may be needed. Overdosage: If you think you have taken too much of this medicine contact a poison control center or emergency room at once. NOTE: This medicine is only for you. Do not share this medicine with others. What if I miss a dose? It is important not to miss your dose. Call your doctor or health care professional if you are unable to keep an appointment. What may interact with this medicine? This medicine may interact with the following medications: -other iron products This list may not describe all possible interactions. Give your health care provider a list of all the medicines, herbs, non-prescription drugs, or dietary supplements you use. Also tell them if you smoke, drink alcohol, or use illegal drugs. Some items may interact with your medicine. What should I watch for while using this medicine? Visit your doctor or healthcare professional regularly. Tell your doctor or healthcare professional  if your symptoms do not start to get better or if they get worse. You may need blood work done while you are taking this medicine. You may need to follow a special diet. Talk to your doctor. Foods that contain iron include: whole grains/cereals, dried fruits, beans, or peas, leafy green vegetables, and organ meats (liver, kidney). What side effects may I notice from receiving this medicine? Side effects that you should report to your doctor or health care professional as soon as possible: -allergic reactions like skin rash, itching or hives, swelling of the face, lips, or tongue -breathing problems -changes in blood pressure -feeling faint or lightheaded, falls -fever or chills -flushing, sweating, or hot feelings -swelling of the ankles or feet Side effects that usually do not require medical attention (report to your doctor or health care professional if they continue or are bothersome): -diarrhea -headache -nausea, vomiting -stomach pain This list may not describe all possible side effects. Call your doctor for medical advice about side effects. You may report side effects to FDA at 1-800-FDA-1088. Where should I keep my medicine? This drug is given in a hospital or clinic and will not be stored at home. NOTE: This sheet is a summary. It may not cover all possible information. If you have questions about this medicine, talk to your doctor, pharmacist, or health care provider.  2019 Elsevier/Gold Standard (2016-05-03 20:21:10)  Coronavirus (COVID-19) Are you at risk?  Are you at risk for the Coronavirus (COVID-19)?  To be considered HIGH RISK for Coronavirus (COVID-19), you have to meet the following criteria:  . Traveled to [place], [place], [place], [place] or   [place]; or in the [place] to [place], [place], [place], or [place]; and have fever, cough, and shortness of breath within the last 2 weeks of travel OR . Been in close contact with a person diagnosed with COVID-19  within the last 2 weeks and have fever, cough, and shortness of breath . IF YOU DO NOT MEET THESE CRITERIA, YOU ARE CONSIDERED LOW RISK FOR COVID-19.  What to do if you are HIGH RISK for COVID-19?  Marland Kitchen If you are having a medical emergency, call 911. . Seek medical care right away. Before you go to a doctor's office, urgent care or emergency department, call ahead and tell them about your recent travel, contact with someone diagnosed with COVID-19, and your symptoms. You should receive instructions from your physician's office regarding next steps of care.  . When you arrive at healthcare provider, tell the healthcare staff immediately you have returned from visiting [place], [place], [place], [place] or [place]; or traveled in the [place] to [place], [place], [place], or [place]; in the last two weeks or you have been in close contact with a person diagnosed with COVID-19 in the last 2 weeks.   . Tell the health care staff about your symptoms: fever, cough and shortness of breath. . After you have been seen by a medical provider, you will be either: o Tested for (COVID-19) and discharged home on quarantine except to seek medical care if symptoms worsen, and asked to  - Stay home and avoid contact with others until you get your results (4-5 days)  - Avoid travel on public transportation if possible (such as bus, train, or airplane) or o Sent to the Emergency Department by EMS for evaluation, COVID-19 testing, and possible admission depending on your condition and test results.  What to do if you are LOW RISK for COVID-19?  Reduce your risk of any infection by using the same precautions used for avoiding the common cold or flu:  Marland Kitchen Wash your hands often with soap and warm water for at least 20 seconds.  If soap and water are not readily available, use an alcohol-based hand sanitizer with at least 60% alcohol.  . If coughing or sneezing, cover your mouth and nose by coughing or sneezing  into the elbow areas of your shirt or coat, into a tissue or into your sleeve (not your hands). . Avoid shaking hands with others and consider head nods or verbal greetings only. . Avoid touching your eyes, nose, or mouth with unwashed hands.  . Avoid close contact with people who are sick. . Avoid places or events with large numbers of people in one location, like concerts or sporting events. . Carefully consider travel plans you have or are making. . If you are planning any travel outside or inside the [place], visit the CDC's Travelers' Health webpage for the latest health notices. . If you have some symptoms but not all symptoms, continue to monitor at home and seek medical attention if your symptoms worsen. . If you are having a medical emergency, call 911.   [place] / e-Visit: [email or web address]         [facility]: [phone]  [facility]: [phone]                   [facility]: [phone]   Leuprolide injection What is this medicine? LEUPROLIDE (loo PROE lide) is a  man-made hormone. It is used to treat the symptoms of prostate cancer. This medicine may also be used to treat children with early onset of puberty. It may be used for other hormonal conditions. This medicine may be used for other purposes; ask your health care provider or pharmacist if you have questions. COMMON BRAND NAME(S): Lupron What should I tell my health care provider before I take this medicine? They need to know if you have any of these conditions: -diabetes -heart disease or previous heart attack -high blood pressure -high cholesterol -pain or difficulty passing urine -spinal cord metastasis -stroke -tobacco smoker -an unusual or allergic reaction to leuprolide, benzyl alcohol, other medicines, foods, dyes, or preservatives -pregnant or trying to get  pregnant -breast-feeding How should I use this medicine? This medicine is for injection under the skin or into a muscle. You will be taught how to prepare and give this medicine. Use exactly as directed. Take your medicine at regular intervals. Do not take your medicine more often than directed. It is important that you put your used needles and syringes in a special sharps container. Do not put them in a trash can. If you do not have a sharps container, call your pharmacist or healthcare provider to get one. A special MedGuide will be given to you by the pharmacist with each prescription and refill. Be sure to read this information carefully each time. Talk to your pediatrician regarding the use of this medicine in children. While this medicine may be prescribed for children as young as 8 years for selected conditions, precautions do apply. Overdosage: If you think you have taken too much of this medicine contact a poison control center or emergency room at once. NOTE: This medicine is only for you. Do not share this medicine with others. What if I miss a dose? If you miss a dose, take it as soon as you can. If it is almost time for your next dose, take only that dose. Do not take double or extra doses. What may interact with this medicine? Do not take this medicine with any of the following medications: -chasteberry This medicine may also interact with the following medications: -herbal or dietary supplements, like black cohosh or DHEA -female hormones, like estrogens or progestins and birth control pills, patches, rings, or injections -female hormones, like testosterone This list may not describe all possible interactions. Give your health care provider a list of all the medicines, herbs, non-prescription drugs, or dietary supplements you use. Also tell them if you smoke, drink alcohol, or use illegal drugs. Some items may interact with your medicine. What should I watch for while using this  medicine? Visit your doctor or health care professional for regular checks on your progress. During the first week, your symptoms may get worse, but then will improve as you continue your treatment. You may get hot flashes, increased bone pain, increased difficulty passing urine, or an aggravation of nerve symptoms. Discuss these effects with your doctor or health care professional, some of them may improve with continued use of this medicine. Female patients may experience a menstrual cycle or spotting during the first 2 months of therapy with this medicine. If this continues, contact your doctor or health care professional. What side effects may I notice from receiving this medicine? Side effects that you should report to your doctor or health care professional as soon as possible: -allergic reactions like skin rash, itching or hives, swelling of the face, lips, or tongue -breathing problems -chest  pain -depression or memory disorders -pain in your legs or groin -pain at site where injected -severe headache -swelling of the feet and legs -visual changes -vomiting Side effects that usually do not require medical attention (report to your doctor or health care professional if they continue or are bothersome): -breast swelling or tenderness -decrease in sex drive or performance -diarrhea -hot flashes -loss of appetite -muscle, joint, or bone pains -nausea -redness or irritation at site where injected -skin problems or acne This list may not describe all possible side effects. Call your doctor for medical advice about side effects. You may report side effects to FDA at 1-800-FDA-1088. Where should I keep my medicine? Keep out of the reach of children. Store below 25 degrees C (77 degrees F). Do not freeze. Protect from light. Do not use if it is not clear or if there are particles present. Throw away any unused medicine after the expiration date. NOTE: This sheet is a summary. It may not  cover all possible information. If you have questions about this medicine, talk to your doctor, pharmacist, or health care provider.  2019 Elsevier/Gold Standard (2015-11-03 10:54:35)

## 2018-08-04 NOTE — Progress Notes (Signed)
Patient stated that she has been having pain and swelling in her left leg since Sunday. She is taking the Xarelto as prescribed. Contacted Sarah RN, nurse for Dr. Alvy Bimler. Jethro Bolus RN then came to the infusion room to discuss follow up plan with patient.

## 2018-08-07 ENCOUNTER — Emergency Department (HOSPITAL_BASED_OUTPATIENT_CLINIC_OR_DEPARTMENT_OTHER): Payer: Medicaid Other

## 2018-08-07 ENCOUNTER — Emergency Department (HOSPITAL_COMMUNITY)
Admission: EM | Admit: 2018-08-07 | Discharge: 2018-08-07 | Disposition: A | Discharge status: Home / Self Care | Payer: Medicaid Other | Attending: Emergency Medicine | Admitting: Emergency Medicine

## 2018-08-07 ENCOUNTER — Other Ambulatory Visit: Payer: Self-pay

## 2018-08-07 ENCOUNTER — Emergency Department (HOSPITAL_COMMUNITY): Payer: Medicaid Other

## 2018-08-07 ENCOUNTER — Encounter (HOSPITAL_COMMUNITY): Payer: Self-pay | Admitting: *Deleted

## 2018-08-07 DIAGNOSIS — R0602 Shortness of breath: Secondary | ICD-10-CM | POA: Insufficient documentation

## 2018-08-07 DIAGNOSIS — R609 Edema, unspecified: Secondary | ICD-10-CM

## 2018-08-07 DIAGNOSIS — M79605 Pain in left leg: Secondary | ICD-10-CM | POA: Insufficient documentation

## 2018-08-07 DIAGNOSIS — Z7901 Long term (current) use of anticoagulants: Secondary | ICD-10-CM | POA: Diagnosis not present

## 2018-08-07 DIAGNOSIS — R2242 Localized swelling, mass and lump, left lower limb: Secondary | ICD-10-CM | POA: Diagnosis not present

## 2018-08-07 DIAGNOSIS — M7989 Other specified soft tissue disorders: Secondary | ICD-10-CM

## 2018-08-07 LAB — BASIC METABOLIC PANEL
Anion gap: 15 (ref 5–15)
BUN: <5 mg/dL — ABNORMAL LOW (ref 6–20)
CO2: 26 mmol/L (ref 22–32)
Calcium: 9.1 mg/dL (ref 8.9–10.3)
Chloride: 99 mmol/L (ref 98–111)
Creatinine, Ser: 0.77 mg/dL (ref 0.44–1.00)
GFR calc Af Amer: >60 mL/min (ref >60)
GFR calc non Af Amer: >60 mL/min (ref >60)
Glucose, Bld: 97 mg/dL (ref 70–99)
Potassium: 3.3 mmol/L — ABNORMAL LOW (ref 3.5–5.1)
Sodium: 140 mmol/L (ref 135–145)

## 2018-08-07 LAB — CBC WITH DIFFERENTIAL/PLATELET
Abs Immature Granulocytes: 0 10*3/uL (ref 0.00–0.07)
Basophils Absolute: 0.1 10*3/uL (ref 0.0–0.1)
Basophils Relative: 1 %
Eosinophils Absolute: 0 10*3/uL (ref 0.0–0.5)
Eosinophils Relative: 0 %
HCT: 28.9 % — ABNORMAL LOW (ref 36.0–46.0)
Hemoglobin: 9.2 g/dL — ABNORMAL LOW (ref 12.0–15.0)
Lymphocytes Relative: 8 %
Lymphs Abs: 0.9 10*3/uL (ref 0.7–4.0)
MCH: 30.8 pg (ref 26.0–34.0)
MCHC: 31.8 g/dL (ref 30.0–36.0)
MCV: 96.7 fL (ref 80.0–100.0)
Monocytes Absolute: 0.5 10*3/uL (ref 0.1–1.0)
Monocytes Relative: 5 %
Neutro Abs: 9.3 10*3/uL — ABNORMAL HIGH (ref 1.7–7.7)
Neutrophils Relative %: 86 %
Platelets: 505 10*3/uL — ABNORMAL HIGH (ref 150–400)
RBC: 2.99 MIL/uL — ABNORMAL LOW (ref 3.87–5.11)
RDW: 25.3 % — ABNORMAL HIGH (ref 11.5–15.5)
WBC: 10.8 10*3/uL — ABNORMAL HIGH (ref 4.0–10.5)
nRBC: 0 /100 WBC
nRBC: 0.3 % — ABNORMAL HIGH (ref 0.0–0.2)

## 2018-08-07 LAB — PROTIME-INR
INR: 1.9 — ABNORMAL HIGH (ref 0.8–1.2)
Prothrombin Time: 21.9 seconds — ABNORMAL HIGH (ref 11.4–15.2)

## 2018-08-07 LAB — APTT: aPTT: 37 seconds — ABNORMAL HIGH (ref 24–36)

## 2018-08-07 LAB — I-STAT BETA HCG BLOOD, ED (MC, WL, AP ONLY): I-stat hCG, quantitative: <5 m[IU]/mL (ref <5)

## 2018-08-07 MED ORDER — POTASSIUM CHLORIDE CRYS ER 20 MEQ PO TBCR
40.0000 meq | EXTENDED_RELEASE_TABLET | Freq: Once | ORAL | Status: AC
  Administered 2018-08-07: 40 meq via ORAL
  Filled 2018-08-07: qty 2

## 2018-08-07 MED ORDER — ONDANSETRON HCL 4 MG/2ML IJ SOLN
4.0000 mg | Freq: Once | INTRAMUSCULAR | Status: AC
  Administered 2018-08-07: 4 mg via INTRAVENOUS
  Filled 2018-08-07: qty 2

## 2018-08-07 MED ORDER — MORPHINE SULFATE (PF) 4 MG/ML IV SOLN
4.0000 mg | Freq: Once | INTRAVENOUS | Status: AC
  Administered 2018-08-07: 4 mg via INTRAVENOUS
  Filled 2018-08-07: qty 1

## 2018-08-07 NOTE — ED Provider Notes (Addendum)
[facility] EMERGENCY DEPARTMENT Provider Note   CSN: [id number] Arrival date & time: 08/07/18  1007    History   Chief Complaint Chief Complaint  Patient presents with   Leg Swelling    LT side    HPI Holly Graham is a 38 y.o. 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 She is on Xarelto for her history of DVT/PE, as well as hormonal therapy for ongoing AUB due to fibroid. She reports some shortness of breath. She states her uterine bleeding has improved with "a shot of something" given to her last week by her hematologist. Scheduled for hysterectomy in August. She reports compliance with her home medications.  Denies chest pain, fever, trauma to the area, redness, insect bites, temperature changes.  Per hematologist Dr. Alvy Bimler note from April: History: She underwent multiple investigation including CT angiogram on 05/08/2018 which revealed bilateral PE with acute right ventricular strain. Urgent echocardiogram did not reveal any signs of heart damage.  Ejection fraction is preserved.  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 She subsequently underwent ultrasound pelvis as it was thought that her uterine fibroid was compressing on the left iliac vein.  Ultrasound pelvis revealed endometrial mass She subsequently underwent MRI of the pelvis which showed large uterine mass/fibroid.  She was evaluated by gynecologist and was placed on Megace. 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  Vascular surgery and interventional radiologist were consulted.  On 05/15/2018, she underwent the following procedures: Procedure Performed: 1.Ultrasound-guided cannulation left popliteal vein 2.Intravascular ultrasound left femoral vein, left common femoral vein, left common external iliac  veins and IVC 3.Stent of left common external iliac veins with 14 x 120 mm Vici 4.Central venogram 5.Mechanical thrombectomy of left common, external iliac veins and common femoral veinwith Inari Clottriever  She was anticoagulated initially with heparin and then transition to oral anticoagulation therapy.     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   Uterine bleeding 05/31/2018   Nausea with vomiting 05/31/2018   Uterine mass 05/23/2018   Anemia associated with acute blood loss 05/23/2018   Other constipation 05/23/2018   Weight loss, abnormal 05/23/2018   Abnormal uterine bleeding (AUB) 05/11/2018   Fibroids 05/11/2018   Bilateral pulmonary embolism ([place]) 05/08/2018   Hypokalemia 05/08/2018    Past Surgical History:  Procedure Laterality Date   DILATION AND CURETTAGE OF UTERUS     INTRAVASCULAR ULTRASOUND/IVUS N/A 05/15/2018   Procedure: INTRAVASCULAR ULTRASOUND/IVUS;  Surgeon: Waynetta Sandy, MD;  Location: [facility];  Service: Cardiovascular;  Laterality: N/A;   PERIPHERAL VASCULAR THROMBECTOMY N/A 05/15/2018   Procedure: PERIPHERAL VASCULAR THROMBECTOMY;  Surgeon: Waynetta Sandy, MD;  Location: [facility];  Service: Cardiovascular;  Laterality: N/A;   TUBAL LIGATION       OB History    Gravida  4   Para  3   Term  3   Preterm      AB  1   Living  3     SAB  1   TAB  0   Ectopic  0   Multiple  0   Live Births  3            Home Medications    Prior to Admission medications   Medication Sig Start Date End Date Taking? Authorizing Provider  acetaminophen (TYLENOL) 500 MG tablet Take 1,000 mg by mouth every 6 (six) hours as needed for mild pain.   Yes [provider]    ondansetron (ZOFRAN) 4 MG tablet Take 1 tablet (4 mg total) by mouth every 8 (eight) hours as needed for nausea or vomiting. 05/31/18  Yes [name], NP  rivaroxaban (XARELTO) 20 MG TABS tablet Take 1 tablet (20 mg total) by mouth daily with supper. 07/17/18  Yes Gorsuch, Ni, MD  megestrol (MEGACE) 40 MG tablet Take 2 tablets (80 mg total) by mouth 2 (two) times daily. Patient not taking: Reported on 08/07/2018 05/18/18   Caren Griffins, MD  norethindrone (AYGESTIN) 5 MG tablet Take 2 tablets (10 mg total) by mouth daily. Patient not taking: Reported on 08/07/2018 06/22/18   Sloan Leiter, MD    Family History Family History  Problem Relation Age of Onset   Pulmonary embolism Brother    Diabetes Maternal Grandmother     Social History Social History   Tobacco Use   Smoking status: Never Smoker   Smokeless tobacco: Never Used  Substance Use Topics   Alcohol use: Yes   Drug use: Never     Allergies   Patient has no known allergies.   Review of Systems Review of Systems  Constitutional: Negative for appetite change, chills and fever.  HENT: Negative for ear pain, rhinorrhea, sneezing and sore throat.   Eyes: Negative for photophobia and visual disturbance.  Respiratory: Positive for shortness of breath. Negative for cough, chest tightness and wheezing.   Cardiovascular: Positive for leg swelling. Negative for chest pain and palpitations.  Gastrointestinal: Negative for abdominal pain, blood in stool, constipation, diarrhea, nausea and vomiting.  Genitourinary: Negative for dysuria, hematuria and urgency.  Musculoskeletal: Negative for myalgias.  Skin: Negative for rash.  Neurological: Negative for dizziness, weakness and light-headedness.     Physical Exam Updated Vital Signs BP (!) 135/91    Pulse 83    Temp 98.2 F (36.8 C) (Oral)    Resp (!) 21    Ht 5\' 3"  (1.6 m)    Wt 62.2 kg    LMP 07/26/2018    SpO2 100%    BMI 24.29 kg/m   Physical Exam Vitals  signs and nursing note reviewed.  Constitutional:      General: She is not in acute distress.    Appearance: She is well-developed.  HENT:     Head: Normocephalic and atraumatic.     Nose: Nose normal.  Eyes:     General: No scleral icterus.       Right eye: No discharge.        Left eye: No discharge.     Conjunctiva/sclera: Conjunctivae normal.  Neck:     Musculoskeletal: Normal range of motion and neck supple.  Cardiovascular:     Rate and Rhythm: Regular rhythm.     Heart sounds: Normal heart sounds. No murmur. No friction rub. No gallop.   Pulmonary:     Effort: Pulmonary effort is normal. No respiratory distress.     Breath sounds: Normal breath sounds.  Abdominal:     General: Bowel sounds are normal. There is no distension.     Palpations: Abdomen is soft.     Tenderness:  There is no abdominal tenderness. There is no guarding.  Musculoskeletal: Normal range of motion.     Comments: Left calf tenderness to palpation.  No overlying skin changes, edema, temperature change noted.  2+ DP pulse palpated bilaterally.  No calf tenderness or skin changes to right calf.  Skin:    General: Skin is warm and dry.     Findings: No rash.  Neurological:     Mental Status: She is alert.     Motor: No abnormal muscle tone.     Coordination: Coordination normal.      ED Treatments / Results  Labs (all labs ordered are listed, but only abnormal results are displayed) Labs Reviewed  BASIC METABOLIC PANEL - Abnormal; Notable for the following components:      Result Value   Potassium 3.3 (*)    BUN <5 (*)    All other components within normal limits  CBC WITH DIFFERENTIAL/PLATELET - Abnormal; Notable for the following components:   WBC 10.8 (*)    RBC 2.99 (*)    Hemoglobin 9.2 (*)    HCT 28.9 (*)    RDW 25.3 (*)    Platelets 505 (*)    nRBC 0.3 (*)    All other components within normal limits  APTT - Abnormal; Notable for the following components:   aPTT 37 (*)    All  other components within normal limits  PROTIME-INR - Abnormal; Notable for the following components:   Prothrombin Time 21.9 (*)    INR 1.9 (*)    All other components within normal limits  I-STAT BETA HCG BLOOD, ED ([facility], [facility], [facility] ONLY)    EKG EKG Interpretation  Date/Time:  Monday Aug 07 2018 11:45:32 EDT Ventricular Rate:  83 PR Interval:    QRS Duration: 94 QT Interval:  367 QTC Calculation: 432 R Axis:   2 Text Interpretation:  Sinus rhythm Borderline short PR interval Left ventricular hypertrophy T wave abnormality Abnormal ekg Confirmed by Carmin Muskrat [phone]) on 08/07/2018 11:49:33 AM   Radiology Dg Chest Portable 1 View  Result Date: 08/07/2018 CLINICAL DATA:  38 year old female with shortness of breath and a prior history of pulmonary embolus EXAM: PORTABLE CHEST 1 VIEW COMPARISON:  Prior chest x-ray 05/16/2018 FINDINGS: The lungs are clear and negative for focal airspace consolidation, pulmonary edema or suspicious pulmonary nodule. No pleural effusion or pneumothorax. Cardiac and mediastinal contours are within normal limits. No acute fracture or lytic or blastic osseous lesions. The visualized upper abdominal bowel gas pattern is unremarkable. IMPRESSION: No active disease. Electronically Signed   By: Jacqulynn Cadet M.D.   On: 08/07/2018 12:15   Vas [place] Lower Extremity Venous (dvt) (only Mc & [facility] 7a-7p)  Result Date: 08/07/2018  Lower Venous Study Indications: Swelling, and Edema.  Performing Technologist: Abram Sander RVS  Examination Guidelines: A complete evaluation includes B-mode imaging, spectral Doppler, color Doppler, and power Doppler as needed of all accessible portions of each vessel. Bilateral testing is considered an integral part of a complete examination. Limited examinations for reoccurring indications may be performed as noted.  +-----+---------------+---------+-----------+----------+-------+  RIGHT Compressibility Phasicity Spontaneity Properties Summary   +-----+---------------+---------+-----------+----------+-------+  CFV   Full            Yes       Yes                             +-----+---------------+---------+-----------+----------+-------+   +---------+---------------+---------+-----------+----------+-------+  LEFT  Compressibility Phasicity Spontaneity Properties Summary  +---------+---------------+---------+-----------+----------+-------+  CFV       Full            Yes       Yes                             +---------+---------------+---------+-----------+----------+-------+  SFJ       Full                                                      +---------+---------------+---------+-----------+----------+-------+  FV Prox   Full                                                      +---------+---------------+---------+-----------+----------+-------+  FV Mid    Full                                                      +---------+---------------+---------+-----------+----------+-------+  FV Distal Full                                                      +---------+---------------+---------+-----------+----------+-------+  PFV       Full                                                      +---------+---------------+---------+-----------+----------+-------+  POP       Full            Yes       Yes                             +---------+---------------+---------+-----------+----------+-------+  PTV       Full                                                      +---------+---------------+---------+-----------+----------+-------+  PERO      Full                                                      +---------+---------------+---------+-----------+----------+-------+     Summary: Right: No evidence of common femoral vein obstruction. Left: There is no evidence of deep vein thrombosis in the lower extremity. No cystic structure found in the popliteal fossa.  *See table(s) above for measurements and observations.    Preliminary  Procedures Procedures  (including critical care time)  Medications Ordered in ED Medications  morphine 4 MG/ML injection 4 mg (4 mg Intravenous Given 08/07/18 1142)  ondansetron (ZOFRAN) injection 4 mg (4 mg Intravenous Given 08/07/18 1140)  potassium chloride SA (K-DUR) CR tablet 40 mEq (40 mEq Oral Given 08/07/18 1240)     Initial Impression / Assessment and Plan / ED Course  I have reviewed the triage vital signs and the nursing notes.  Pertinent labs & imaging results that were available during my care of the patient were reviewed by me and considered in my medical decision making (see chart for details).  Clinical Course as of Aug 06 1308  Mon Aug 07, 2018  1140 Summary: Right: No evidence of common femoral vein obstruction. Left: There is no evidence of deep vein thrombosis in the lower extremity. No cystic structure found in the popliteal fossa.   [HK]    Clinical Course User Index [HK] Delia Heady, PA-C       Holly Graham was evaluated in Emergency Department on 08/07/18  for the symptoms described in the history of present illness. He/she was evaluated in the context of the global COVID-19 pandemic, which necessitated consideration that the patient might be at risk for infection with the SARS-CoV-2 virus that causes COVID-19. Institutional protocols and algorithms that pertain to the evaluation of patients at risk for COVID-19 are in a state of rapid change based on information released by regulatory bodies including the CDC and federal and state organizations. These policies and algorithms were followed during the patient's care in the ED.  38 year old female with a past medical history of DVT/PE currently anticoagulated on Xarelto presents to ED for left leg pain for the past 2 weeks.  Has been seen by her hematologist regarding her extensive history listed above.  She notes some shortness of breath but denies any chest pain. On my exam there is calf tenderness but I do not appreciate edema,  temperature change or overlying skin changes.  Area is neurovascularly intact, 2+ DP pulse palpated.  She has been ambulatory since pain began. Lungd CTAB. She is not tachycardic, tachypneic or hypoxic.  Ultrasound with no evidence of DVT or other abnormalities.  Chest x-ray is unremarkable.  EKG shows rhythm, borderline short PR interval.  Hemoglobin of 9.2 which is similar to prior.  Mild hypokalemia of 3.3 which is repleted orally.  Other lab work is reassuring.  She has had improvement with pain medication given here. Unsure of cause of discomfort but able to r/o DVT. I do not see any indication for further work-up at this time.  We will have her follow-up with hematologist, PCP and return for any worsening symptoms.  Patient is hemodynamically stable, in NAD, and able to ambulate in the ED. Evaluation does not show pathology that would require ongoing emergent intervention or inpatient treatment. I explained the diagnosis to the patient. Pain has been managed and has no complaints prior to discharge. Patient is comfortable with above plan and is stable for discharge at this time. All questions were answered prior to disposition. Strict return precautions for returning to the ED were discussed. Encouraged follow up with PCP.   An After Visit Summary was printed and given to the patient.   Portions of this note were generated with [employer]. Dictation errors may occur despite best attempts at proofreading.  Final Clinical Impressions(s) / ED Diagnoses   Final diagnoses:  Pain of left lower extremity  ED Discharge Orders    None         Delia Heady, [place] 08/07/18 1310    Carmin Muskrat, MD 08/07/18 1315

## 2018-08-07 NOTE — Discharge Instructions (Signed)
Continue your home medications as previously prescribed. Follow-up with your primary care provider and your hematologist. Return to the ED if you start to have worsening pain or swelling, chest pain, shortness of breath or coughing up blood.

## 2018-08-07 NOTE — ED Triage Notes (Signed)
Pt reports Lt leg swelling on Lt leg started Sunday 07-30-2018. Pt reports she is currently taking xarelto for blood clot in Lt leg.

## 2018-08-07 NOTE — ED Notes (Signed)
Patient verbalizes understanding of discharge instructions. Opportunity for questioning and answers were provided. Armband removed by staff, pt discharged from ED.  

## 2018-08-07 NOTE — Progress Notes (Signed)
Lower extremity venous has been completed.   Preliminary results in CV Proc.   Abram Sander 08/07/2018 11:26 AM

## 2018-08-07 NOTE — ED Notes (Signed)
Previous RN with two unsuccessful IV attempts, IV consult ordered, will attempt IV.

## 2018-08-09 ENCOUNTER — Emergency Department (HOSPITAL_BASED_OUTPATIENT_CLINIC_OR_DEPARTMENT_OTHER): Payer: Medicaid Other

## 2018-08-09 ENCOUNTER — Other Ambulatory Visit: Payer: Self-pay

## 2018-08-09 ENCOUNTER — Emergency Department (HOSPITAL_COMMUNITY)
Admission: EM | Admit: 2018-08-09 | Discharge: 2018-08-09 | Disposition: A | Discharge status: Home / Self Care | Payer: Medicaid Other | Attending: Emergency Medicine | Admitting: Emergency Medicine

## 2018-08-09 ENCOUNTER — Emergency Department (HOSPITAL_COMMUNITY): Payer: Medicaid Other

## 2018-08-09 DIAGNOSIS — M79605 Pain in left leg: Secondary | ICD-10-CM | POA: Diagnosis present

## 2018-08-09 DIAGNOSIS — Z79899 Other long term (current) drug therapy: Secondary | ICD-10-CM | POA: Insufficient documentation

## 2018-08-09 DIAGNOSIS — M7989 Other specified soft tissue disorders: Secondary | ICD-10-CM | POA: Diagnosis not present

## 2018-08-09 DIAGNOSIS — M79609 Pain in unspecified limb: Secondary | ICD-10-CM | POA: Diagnosis not present

## 2018-08-09 DIAGNOSIS — Z7901 Long term (current) use of anticoagulants: Secondary | ICD-10-CM | POA: Insufficient documentation

## 2018-08-09 DIAGNOSIS — Z86718 Personal history of other venous thrombosis and embolism: Secondary | ICD-10-CM

## 2018-08-09 LAB — CBC WITH DIFFERENTIAL/PLATELET
Abs Immature Granulocytes: 0.08 10*3/uL — ABNORMAL HIGH (ref 0.00–0.07)
Basophils Absolute: 0 10*3/uL (ref 0.0–0.1)
Basophils Relative: 0 %
Eosinophils Absolute: 0.1 10*3/uL (ref 0.0–0.5)
Eosinophils Relative: 0 %
HCT: 29.7 % — ABNORMAL LOW (ref 36.0–46.0)
Hemoglobin: 9.4 g/dL — ABNORMAL LOW (ref 12.0–15.0)
Immature Granulocytes: 1 %
Lymphocytes Relative: 10 %
Lymphs Abs: 1.6 10*3/uL (ref 0.7–4.0)
MCH: 31.4 pg (ref 26.0–34.0)
MCHC: 31.6 g/dL (ref 30.0–36.0)
MCV: 99.3 fL (ref 80.0–100.0)
Monocytes Absolute: 0.6 10*3/uL (ref 0.1–1.0)
Monocytes Relative: 4 %
Neutro Abs: 14.2 10*3/uL — ABNORMAL HIGH (ref 1.7–7.7)
Neutrophils Relative %: 85 %
Platelets: 487 10*3/uL — ABNORMAL HIGH (ref 150–400)
RBC: 2.99 MIL/uL — ABNORMAL LOW (ref 3.87–5.11)
RDW: 25.4 % — ABNORMAL HIGH (ref 11.5–15.5)
WBC: 16.6 10*3/uL — ABNORMAL HIGH (ref 4.0–10.5)
nRBC: 0 % (ref 0.0–0.2)

## 2018-08-09 LAB — BASIC METABOLIC PANEL
Anion gap: 12 (ref 5–15)
BUN: 8 mg/dL (ref 6–20)
CO2: 24 mmol/L (ref 22–32)
Calcium: 9.2 mg/dL (ref 8.9–10.3)
Chloride: 100 mmol/L (ref 98–111)
Creatinine, Ser: 0.67 mg/dL (ref 0.44–1.00)
GFR calc Af Amer: >60 mL/min (ref >60)
GFR calc non Af Amer: >60 mL/min (ref >60)
Glucose, Bld: 90 mg/dL (ref 70–99)
Potassium: 4.1 mmol/L (ref 3.5–5.1)
Sodium: 136 mmol/L (ref 135–145)

## 2018-08-09 MED ORDER — FENTANYL CITRATE (PF) 100 MCG/2ML IJ SOLN
25.0000 ug | Freq: Once | INTRAMUSCULAR | Status: AC
  Administered 2018-08-09: 25 ug via INTRAVENOUS
  Filled 2018-08-09: qty 2

## 2018-08-09 MED ORDER — MORPHINE SULFATE (PF) 4 MG/ML IV SOLN
4.0000 mg | Freq: Once | INTRAVENOUS | Status: AC
  Administered 2018-08-09: 14:00:00 4 mg via INTRAVENOUS
  Filled 2018-08-09: qty 1

## 2018-08-09 MED ORDER — METHOCARBAMOL 500 MG PO TABS: 500.0000 mg | ORAL_TABLET | Freq: Two times a day (BID) | ORAL | 0 refills | Status: DC

## 2018-08-09 NOTE — Progress Notes (Signed)
Limited left lower extremity doppler to visualize the Iliac veins has been completed. Results are located under CV Proc.   Darlina Sicilian RDCS

## 2018-08-09 NOTE — TOC Transition Note (Signed)
Transition of Care [facility]) - CM/SW Discharge Note   Patient Details  Name: Holly Graham MRN: [id number] Date of Birth: 1980-06-09  Transition of Care [facility]) CM/SW Contact:  Erenest Rasher, RN Phone Number: 08/09/2018, 1:19 PM   Clinical Narrative:   5 ED visits in 6 months  Chart reviewed to assist with arranging follow up with PCP. Noted pt has appt on 08/14/2018 to follow up with Dr Alvy Bimler and has follow up appt on 08/16/2018 to follow up with [facility]. Pt received Xarelto from [facility]. Reported to ED RN, states she has been taking her meds as prescribed. Pt resting quietly. Will continue to follow for dc needs.Jonnie Finner RN CCM Case Mgmt phone [phone]

## 2018-08-09 NOTE — ED Notes (Signed)
ED Provider at bedside. 

## 2018-08-09 NOTE — Discharge Instructions (Addendum)
Follow up with your primary care provider.  Return to the ED if you start to develop chest pain, redness in your legs, injuries or falls, fever.

## 2018-08-09 NOTE — ED Notes (Signed)
Patient transported to radiology

## 2018-08-09 NOTE — ED Provider Notes (Signed)
[facility] DEPT Provider Note   CSN: [id number] Arrival date & time: 08/09/18  1120    History   Chief Complaint Chief Complaint  Patient presents with   Leg Pain    HPI Kasy Hoggard is a 38 y.o. 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  She was seen and evaluated 2 days ago with similar symptoms.  Pain was treated, ultrasound and lab work was done.  Ultrasound of the left lower extremity showed no DVT or other abnormality.  States that she tried to get an appointment with her PCP for follow-up but is unable to do so for another week.  She also is unable to follow up with her vascular surgeon.  She cannot recall any injuries or traumas. Denies chest pain, redness, changes to sensation, fever.  She underwent multiple investigation including CT angiogram on 05/08/2018 which revealed bilateral PE with acute right ventricular strain. Urgent echocardiogram did not reveal any signs of heart damage. Ejection fraction is preserved.  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 She subsequently underwent ultrasound pelvis as it was thought that her uterine fibroid was compressing on the left iliac vein. Ultrasound pelvis revealed endometrial mass She subsequently underwent MRI of the pelvis which showed large uterine mass/fibroid. She was evaluated by gynecologist and was placed on Megace. 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 Vascular surgery and interventional radiologist were consulted.  On 05/15/2018, she underwent the following procedures: Procedure Performed: 1.Ultrasound-guided cannulation left popliteal vein 2.Intravascular ultrasound left femoral vein, left common femoral vein, left common external iliac veins and IVC 3.Stent of  left common external iliac veins with 14 x 120 mm Vici 4.Central venogram 5.Mechanical thrombectomy of left common, external iliac veins and common femoral veinwith Inari Clottriever  She was anticoagulated initially with heparin and then transition to oral anticoagulation therapy.     HPI  Past Medical History:  Diagnosis Date   Fibroids 05/11/2018   Pulmonary emboli [facility])     Patient Active Problem List   Diagnosis Date Noted   Iron deficiency anemia due to chronic blood loss 07/05/2018   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   Uterine bleeding 05/31/2018   Nausea with vomiting 05/31/2018   Uterine mass 05/23/2018   Anemia associated with acute blood loss 05/23/2018   Other constipation 05/23/2018   Weight loss, abnormal 05/23/2018   Abnormal uterine bleeding (AUB) 05/11/2018   Fibroids 05/11/2018   Bilateral pulmonary embolism ([place]) 05/08/2018   Hypokalemia 05/08/2018    Past Surgical History:  Procedure Laterality Date   DILATION AND CURETTAGE OF UTERUS     INTRAVASCULAR ULTRASOUND/IVUS N/A 05/15/2018   Procedure: INTRAVASCULAR ULTRASOUND/IVUS;  Surgeon: Waynetta Sandy, MD;  Location: [facility];  Service: Cardiovascular;  Laterality: N/A;   PERIPHERAL VASCULAR THROMBECTOMY N/A 05/15/2018   Procedure: PERIPHERAL VASCULAR THROMBECTOMY;  Surgeon: Waynetta Sandy, MD;  Location: [facility];  Service: Cardiovascular;  Laterality: N/A;   TUBAL LIGATION       OB History    Gravida  4   Para  3   Term  3   Preterm      AB  1   Living  3     SAB  1   TAB  0   Ectopic  0   Multiple  0  Live Births  3            Home Medications    Prior to Admission medications   Medication Sig Start Date End Date Taking? Authorizing Provider  acetaminophen (TYLENOL) 500 MG tablet Take 1,000 mg by mouth every 6 (six) hours as needed for mild pain.   Yes [provider]  ondansetron (ZOFRAN) 4 MG  tablet Take 1 tablet (4 mg total) by mouth every 8 (eight) hours as needed for nausea or vomiting. 05/31/18  Yes [name], NP  rivaroxaban (XARELTO) 20 MG TABS tablet Take 1 tablet (20 mg total) by mouth daily with supper. 07/17/18  Yes Gorsuch, Ni, MD  megestrol (MEGACE) 40 MG tablet Take 2 tablets (80 mg total) by mouth 2 (two) times daily. Patient not taking: Reported on 08/07/2018 05/18/18   Caren Griffins, MD  methocarbamol (ROBAXIN) 500 MG tablet Take 1 tablet (500 mg total) by mouth 2 (two) times daily. 08/09/18   Vannie Hochstetler, PA-C  norethindrone (AYGESTIN) 5 MG tablet Take 2 tablets (10 mg total) by mouth daily. Patient not taking: Reported on 08/07/2018 06/22/18   Sloan Leiter, MD    Family History Family History  Problem Relation Age of Onset   Pulmonary embolism Brother    Diabetes Maternal Grandmother     Social History Social History   Tobacco Use   Smoking status: Never Smoker   Smokeless tobacco: Never Used  Substance Use Topics   Alcohol use: Yes   Drug use: Never     Allergies   Patient has no known allergies.   Review of Systems Review of Systems  Constitutional: Negative for appetite change, chills and fever.  HENT: Negative for ear pain, rhinorrhea, sneezing and sore throat.   Eyes: Negative for photophobia and visual disturbance.  Respiratory: Negative for cough, chest tightness, shortness of breath and wheezing.   Cardiovascular: Positive for leg swelling. Negative for chest pain and palpitations.  Gastrointestinal: Negative for abdominal pain, blood in stool, constipation, diarrhea, nausea and vomiting.  Genitourinary: Negative for dysuria, hematuria and urgency.  Musculoskeletal: Negative for myalgias.  Skin: Negative for rash.  Neurological: Negative for dizziness, weakness and light-headedness.     Physical Exam Updated Vital Signs BP (!) 151/119    Pulse 93    Temp (!) 97.5 F (36.4 C) (Oral)    Resp 18    LMP 07/26/2018     SpO2 100%   Physical Exam Vitals signs and nursing note reviewed.  Constitutional:      General: She is not in acute distress.    Appearance: She is well-developed.  HENT:     Head: Normocephalic and atraumatic.     Nose: Nose normal.  Eyes:     General: No scleral icterus.       Left eye: No discharge.     Conjunctiva/sclera: Conjunctivae normal.  Neck:     Musculoskeletal: Normal range of motion and neck supple.  Cardiovascular:     Rate and Rhythm: Normal rate and regular rhythm.     Heart sounds: Normal heart sounds. No murmur. No friction rub. No gallop.   Pulmonary:     Effort: Pulmonary effort is normal. No respiratory distress.     Breath sounds: Normal breath sounds.  Abdominal:     General: Bowel sounds are normal. There is no distension.     Palpations: Abdomen is soft.     Tenderness: There is no abdominal tenderness. There is no guarding.  Musculoskeletal: Normal range of motion.     Left lower leg: Edema present.     Comments: Subtle nonpitting edema to LLE. 2+ DP pulses palpated.  Sensation intact to light touch of extremity. No overlying skin changes noted. FROM of ankle and knee.  Skin:    General: Skin is warm and dry.     Findings: No rash.  Neurological:     Mental Status: She is alert.     Motor: No abnormal muscle tone.     Coordination: Coordination normal.      ED Treatments / Results  Labs (all labs ordered are listed, but only abnormal results are displayed) Labs Reviewed  CBC WITH DIFFERENTIAL/PLATELET - Abnormal; Notable for the following components:      Result Value   WBC 16.6 (*)    RBC 2.99 (*)    Hemoglobin 9.4 (*)    HCT 29.7 (*)    RDW 25.4 (*)    Platelets 487 (*)    Neutro Abs 14.2 (*)    Abs Immature Granulocytes 0.08 (*)    All other components within normal limits  BASIC METABOLIC PANEL    EKG None  Radiology Vas [place] Lower Extremity Venous (dvt) (only [facility] 7a-7p)  Result Date: 08/09/2018  Lower Venous Study  Indications: Swelling, Pain, and DVT-personal history of. Other Indications: History of PE. May Thurner. Risk Factors: Surgery Stent of the left common external iliac vein, mechanical hormonal therapy for ongoing AUB due to fibroid. Anticoagulation: Xarelto. Limitations: Air/bowl gas. Comparison Study: 08/07/2018 and 06/19/2018 Performing Technologist: Darlina Sicilian RDCS  Examination Guidelines: A complete evaluation includes B-mode imaging, spectral Doppler, color Doppler, and power Doppler as needed of all accessible portions of each vessel. Bilateral testing is considered an integral part of a complete examination. Limited examinations for reoccurring indications may be performed as noted.  +----+---------------+---------+-----------+----------+-------+  LEFT Compressibility Phasicity Spontaneity Properties Summary  +----+---------------+---------+-----------+----------+-------+  CFV                  Yes       Yes                             +----+---------------+---------+-----------+----------+-------+ Limited exam to visualize Iliac vein within the left leg. A left lower extremity venous was performed on 08/07/18. Left common iliac vein and left external iliac vein were patent and spontaneous. IVC not visualized well due to air/bowel gas.    *See table(s) above for measurements and observations.    Preliminary     Procedures Procedures (including critical care time)  Medications Ordered in ED Medications  fentaNYL (SUBLIMAZE) injection 25 mcg (25 mcg Intravenous Given 08/09/18 1215)  morphine 4 MG/ML injection 4 mg (4 mg Intravenous Given 08/09/18 1335)     Initial Impression / Assessment and Plan / ED Course  I have reviewed the triage vital signs and the nursing notes.  Pertinent labs & imaging results that were available during my care of the patient were reviewed by me and considered in my medical decision making (see chart for details).  Clinical Course as of Aug 09 1410  Wed Aug 09, 2018   1239 Dr. Clovis Riley [id number] radiologist   [HK]  1255 Spoke to Dr. Trula Slade of vascular surgery. He recommends iliocaval venous u/s to evaluate DVT in this patient.   [HK]  9449 Limited exam to visualize Iliac vein within the left leg. A left lower extremity venous was performed  on 08/07/18. Left common iliac vein and left external iliac vein were patent and spontaneous. IVC not visualized well due to air/bowel gas.   [HK]    Clinical Course User Index [HK] Delia Heady, PA-C       38 year old female with a past medical history of DVT/PE, status post mechanical thrombectomy of left common, external iliac veins and common femoral vein, stent placed in L  Common external iliac vein, presents to ED for ongoing left leg pain and swelling. Denies chest pain or shortness of breath.  She was seen and evaluated 2 days ago with negative work-up including ultrasound to rule out recurrent DVT.  Pain was controlled in the ED but she returns today due to ongoing swelling and pain.  She has not yet been able to follow-up with her vascular surgeon or her PCP.  On my exam there is subtle nonpitting edema of the LLE when compared to the R. Pulses intact, normal ROM, normal sensation to light touch. There are no overlying skin changes that are concerning. Repeat labs still with leukocytosis but otherwise unremarkable. Patient underwent U/S iliocaval protocol per Dr. Trula Slade recommendations. This was negative for DVT as well. Because there appears to be no vascular cause of her symptoms, will have her f/u with pcp for any further recommendations. Doubt infectious cause as there are no concerning skin changes. Possible muscle pain, will rx muscle relaxer and heat therapy. Will give strict return precautions.  Patient is hemodynamically stable, in NAD, and able to ambulate in the ED. Evaluation does not show pathology that would require ongoing emergent intervention or inpatient treatment. I explained the diagnosis to the  patient. Pain has been managed and has no complaints prior to discharge. Patient is comfortable with above plan and is stable for discharge at this time. All questions were answered prior to disposition. Strict return precautions for returning to the ED were discussed. Encouraged follow up with PCP.   An After Visit Summary was printed and given to the patient.   Portions of this note were generated with [employer]. Dictation errors may occur despite best attempts at proofreading.   Final Clinical Impressions(s) / ED Diagnoses   Final diagnoses:  Left leg pain    ED Discharge Orders         Ordered    methocarbamol (ROBAXIN) 500 MG tablet  2 times daily     08/09/18 1402           Delia Heady, PA-C 08/09/18 1413    Daleen Bo, MD 08/09/18 1453

## 2018-08-09 NOTE — ED Notes (Signed)
Vascular bedside

## 2018-08-09 NOTE — ED Triage Notes (Signed)
Patient reports left leg pain and swelling. Pt reports she was seen 2 days ago. Pt reports more pain and swelling at this time and states she is on xarelto for blood clots in her legs.

## 2018-08-14 ENCOUNTER — Other Ambulatory Visit: Payer: Self-pay

## 2018-08-14 ENCOUNTER — Inpatient Hospital Stay (HOSPITAL_BASED_OUTPATIENT_CLINIC_OR_DEPARTMENT_OTHER): Payer: Medicaid Other | Admitting: Hematology and Oncology

## 2018-08-14 ENCOUNTER — Inpatient Hospital Stay: Payer: Medicaid Other

## 2018-08-14 ENCOUNTER — Encounter: Payer: Self-pay | Admitting: Hematology and Oncology

## 2018-08-14 DIAGNOSIS — R109 Unspecified abdominal pain: Secondary | ICD-10-CM

## 2018-08-14 DIAGNOSIS — I2699 Other pulmonary embolism without acute cor pulmonale: Secondary | ICD-10-CM

## 2018-08-14 DIAGNOSIS — K5900 Constipation, unspecified: Secondary | ICD-10-CM

## 2018-08-14 DIAGNOSIS — N858 Other specified noninflammatory disorders of uterus: Secondary | ICD-10-CM

## 2018-08-14 DIAGNOSIS — R5381 Other malaise: Secondary | ICD-10-CM | POA: Diagnosis not present

## 2018-08-14 DIAGNOSIS — N939 Abnormal uterine and vaginal bleeding, unspecified: Secondary | ICD-10-CM

## 2018-08-14 DIAGNOSIS — R079 Chest pain, unspecified: Secondary | ICD-10-CM

## 2018-08-14 DIAGNOSIS — D62 Acute posthemorrhagic anemia: Secondary | ICD-10-CM | POA: Diagnosis not present

## 2018-08-14 DIAGNOSIS — D259 Leiomyoma of uterus, unspecified: Secondary | ICD-10-CM | POA: Diagnosis not present

## 2018-08-14 LAB — SAMPLE TO BLOOD BANK

## 2018-08-14 LAB — CBC WITH DIFFERENTIAL/PLATELET
Abs Immature Granulocytes: 0.04 10*3/uL (ref 0.00–0.07)
Basophils Absolute: 0 10*3/uL (ref 0.0–0.1)
Basophils Relative: 0 %
Eosinophils Absolute: 0 10*3/uL (ref 0.0–0.5)
Eosinophils Relative: 0 %
HCT: 30.9 % — ABNORMAL LOW (ref 36.0–46.0)
Hemoglobin: 9.8 g/dL — ABNORMAL LOW (ref 12.0–15.0)
Immature Granulocytes: 1 %
Lymphocytes Relative: 19 %
Lymphs Abs: 1.6 10*3/uL (ref 0.7–4.0)
MCH: 31.1 pg (ref 26.0–34.0)
MCHC: 31.7 g/dL (ref 30.0–36.0)
MCV: 98.1 fL (ref 80.0–100.0)
Monocytes Absolute: 0.8 10*3/uL (ref 0.1–1.0)
Monocytes Relative: 9 %
Neutro Abs: 6.2 10*3/uL (ref 1.7–7.7)
Neutrophils Relative %: 71 %
Platelets: 523 10*3/uL — ABNORMAL HIGH (ref 150–400)
RBC: 3.15 MIL/uL — ABNORMAL LOW (ref 3.87–5.11)
RDW: 24.7 % — ABNORMAL HIGH (ref 11.5–15.5)
WBC: 8.7 10*3/uL (ref 4.0–10.5)
nRBC: 0 % (ref 0.0–0.2)

## 2018-08-15 ENCOUNTER — Telehealth (INDEPENDENT_AMBULATORY_CARE_PROVIDER_SITE_OTHER): Payer: Medicaid Other | Admitting: Obstetrics and Gynecology

## 2018-08-15 ENCOUNTER — Telehealth: Payer: Self-pay | Admitting: Obstetrics and Gynecology

## 2018-08-15 ENCOUNTER — Encounter: Payer: Self-pay | Admitting: Hematology and Oncology

## 2018-08-15 ENCOUNTER — Telehealth: Payer: Self-pay | Admitting: *Deleted

## 2018-08-15 DIAGNOSIS — N939 Abnormal uterine and vaginal bleeding, unspecified: Secondary | ICD-10-CM

## 2018-08-15 DIAGNOSIS — D259 Leiomyoma of uterus, unspecified: Secondary | ICD-10-CM

## 2018-08-15 DIAGNOSIS — R5381 Other malaise: Secondary | ICD-10-CM | POA: Insufficient documentation

## 2018-08-15 NOTE — Assessment & Plan Note (Signed)
I have discussed the case briefly with GYN oncologist I recommend Lupron which she was given recently and then I will schedule her to see GYN oncologist for complete hysterectomy in August

## 2018-08-15 NOTE — Assessment & Plan Note (Signed)
She tolerated Xarelto well  I recommend minimum 6 months to a year for treatment The most important thing would be to get hysterectomy so that to relieve the pressure on her venous system and to prevent recurrence of DVT/PE

## 2018-08-15 NOTE — Telephone Encounter (Signed)
Attempted to call patient to inform her of needing to setup her MyChart to have this visit. Left a detailed message for her to call us before her visit.

## 2018-08-15 NOTE — Assessment & Plan Note (Signed)
She is profoundly debilitated with recent diagnosis and chronic leg pain secondary to DVT She is not able to work I have given her a letter to support her application for short-term disability over the next 3 months

## 2018-08-15 NOTE — Telephone Encounter (Signed)
Patient verified DOB Patient was scheduled a HFU with PCP. Patient was seen by oncology and OB/GYN for the hospital concerns and states she will follow up with her primary for her regular visit when needed.

## 2018-08-15 NOTE — Assessment & Plan Note (Signed)
Her hemoglobin is stable  She does not need further transfusion support I will recheck iron study in her next visit

## 2018-08-15 NOTE — Progress Notes (Signed)
[facility] OFFICE PROGRESS NOTE  Patient Care Team: [name], NP as PCP - General (Internal Medicine)  ASSESSMENT & PLAN:  Bilateral pulmonary embolism (Organ) She tolerated Xarelto well  I recommend minimum 6 months to a year for treatment The most important thing would be to get hysterectomy so that to relieve the pressure on her venous system and to prevent recurrence of DVT/PE  Anemia associated with acute blood loss Her hemoglobin is stable  She does not need further transfusion support I will recheck iron study in her next visit  Uterine mass I have discussed the case briefly with GYN oncologist I recommend Lupron which she was given recently and then I will schedule her to see GYN oncologist for complete hysterectomy in August  Physical debility She is profoundly debilitated with recent diagnosis and 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 Iron and TIBC    Standing Status:   Standing    Number of Occurrences:   2    Standing Expiration Date:   08/15/2019  . Ferritin    Standing Status:   Standing    Number of Occurrences:   2    Standing Expiration Date:   08/15/2019    INTERVAL HISTORY: Please see below for problem oriented charting. She returns for further follow-up She complained of excessive fatigue and persistent leg pain and swelling She has been to the emergency department several times for the leg pain No chest pain or shortness of breath She has minimum scant vaginal bleeding since Lupron injection  SUMMARY OF ONCOLOGIC HISTORY:  Holly Graham 38 y.o. female is here because of recent diagnosis of DVT and PE  She initially presented to the emergency department at the end of January for symptom of chest pain but subsequently left without further evaluation She  represented again to the emergency department on May 08, 2018 and was admitted and hospitalized until 05/18/2018. Her presentation was associated with chest pain, shortness of breath and leg swelling According to her, she has been having chest pain and shortness of breath since Christmas.  Her leg swelling was new since after the new year  She underwent multiple investigation including CT angiogram on 05/08/2018 which revealed bilateral PE with acute right ventricular strain. Urgent echocardiogram did not reveal any signs of heart damage.  Ejection fraction is preserved.  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 She subsequently underwent ultrasound pelvis as it was thought that her uterine fibroid was compressing on the left iliac vein.  Ultrasound pelvis revealed endometrial mass She subsequently underwent MRI of the pelvis which showed large uterine mass/fibroid.  She was evaluated by gynecologist and was placed on Megace. 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  Vascular surgery and interventional radiologist were consulted.  On 05/15/2018, she underwent the following procedures: Procedure Performed: 1.  Ultrasound-guided cannulation left popliteal vein 2.  Intravascular ultrasound left femoral vein, left common femoral vein, left common external iliac veins and IVC 3.  Stent of left common external iliac veins with 14 x 120 mm Vici 4.  Central venogram 5.  Mechanical thrombectomy of left common, external iliac veins and common femoral vein with Inari Clottriever  She was anticoagulated initially with heparin and then transition to oral anticoagulation therapy. During her hospital stay, she  also received a unit of blood transfusion. With her last menstrual.,  She has been bleeding since April 30, 2018.  Recently, she passed a huge clot with the size of a cantaloupe.  She show me pictures that she took. She [place] at the age of 53, her usual cycle is 3  to 4 days, for cycle 28 days, usually nonheavy Over the last 6 months, she is bleeding heavier than usual, 5 to 7 days of a cycle of every 28 days She has been placed on Megace since hospitalization She has significant lower abdominal cramp She complaining of significant fatigue despite recent blood transfusion She also complained of anorexia and occasional postprandial nausea and vomiting. She also new onset of constipation.  She takes prune juice because without that, her bowel movement will be every 3 to 4 days She still have intermittent chest pain and shortness of breath but her leg swelling has mildly improved  She denies recent history of trauma, long distance travel, dehydration, recent surgery, smoking or prolonged immobilization. She had no prior history or diagnosis of cancer.  Her last Pap smear is more than 12 years ago.  She has not have any examination since her last child was born  The patient had been exposed to birth control pills and surgeries and never had thrombotic events. The patient had been pregnant before and denies history of peripartum thromboembolic event or history of recurrent miscarriages. There is no family history of blood clots or miscarriages. From June 22, 2018, she underwent endometrial biopsy Biopsy [id number] Endometrium, biopsy - SCANT INACTIVE ENDOMETRIUM AND SCANT ENDOCERVICAL MUCOSA. - ABUNDANT BLOOD. - NO ATYPIA OR MALIGNANCY. She was placed on Xarelto On 08/04/2018, she received one dose of Lupron plus Iv Iron  REVIEW OF SYSTEMS:   Constitutional: Denies fevers, chills or abnormal weight loss Eyes: Denies blurriness of vision Ears, nose, mouth, throat, and face: Denies mucositis or sore throat Respiratory: Denies cough, dyspnea or wheezes Cardiovascular: Denies palpitation, chest discomfort Gastrointestinal:  Denies nausea, heartburn or change in bowel habits Skin: Denies abnormal skin rashes Lymphatics: Denies new lymphadenopathy or easy  bruising Neurological:Denies numbness, tingling or new weaknesses Behavioral/Psych: Mood is stable, no new changes  All other systems were reviewed with the patient and are negative.  I have reviewed the past medical history, past surgical history, social history and family history with the patient and they are unchanged from previous note.  ALLERGIES:  has No Known Allergies.  MEDICATIONS:  Current Outpatient Medications  Medication Sig Dispense Refill  . acetaminophen (TYLENOL) 500 MG tablet Take 1,000 mg by mouth every 6 (six) hours as needed for mild pain.    . megestrol (MEGACE) 40 MG tablet Take 2 tablets (80 mg total) by mouth 2 (two) times daily. (Patient not taking: Reported on 08/07/2018) 60 tablet 1  . methocarbamol (ROBAXIN) 500 MG tablet Take 1 tablet (500 mg total) by mouth 2 (two) times daily. 20 tablet 0  . norethindrone (AYGESTIN) 5 MG tablet Take 2 tablets (10 mg total) by mouth daily. (Patient not taking: Reported on 08/07/2018) 30 tablet 2  . ondansetron (ZOFRAN) 4 MG tablet Take 1 tablet (4 mg total) by mouth every 8 (eight) hours as needed for nausea or vomiting. 20 tablet 0  . rivaroxaban (XARELTO) 20 MG TABS tablet Take 1 tablet (20 mg total) by mouth daily with supper. 30 tablet 9   No current facility-administered medications for this visit.     PHYSICAL EXAMINATION: ECOG PERFORMANCE STATUS: 2 -  Symptomatic, <50% confined to bed  Vitals:   08/14/18 1130  BP: 127/85  Pulse: 99  Resp: 18  Temp: 98.3 F (36.8 C)  SpO2: 99%   Filed Weights   08/14/18 1130  Weight: 135 lb 3.2 oz (61.3 kg)    GENERAL:alert, no distress and comfortable Musculoskeletal:no cyanosis of digits and no clubbing  NEURO: alert & oriented x 3 with fluent speech, no focal motor/sensory deficits  LABORATORY DATA:  I have reviewed the data as listed    Component Value Date/Time   NA 136 08/09/2018 1217   K 4.1 08/09/2018 1217   CL 100 08/09/2018 1217   CO2 24 08/09/2018 1217    GLUCOSE 90 08/09/2018 1217   BUN 8 08/09/2018 1217   CREATININE 0.67 08/09/2018 1217   CALCIUM 9.2 08/09/2018 1217   PROT 7.9 05/23/2018 1453   ALBUMIN 3.4 (L) 05/23/2018 1453   AST 17 05/23/2018 1453   ALT 30 05/23/2018 1453   ALKPHOS 190 (H) 05/23/2018 1453   BILITOT 0.3 05/23/2018 1453   GFRNONAA >60 08/09/2018 1217   GFRAA >60 08/09/2018 1217    No results found for: SPEP, UPEP  Lab Results  Component Value Date   WBC 8.7 08/14/2018   NEUTROABS 6.2 08/14/2018   HGB 9.8 (L) 08/14/2018   HCT 30.9 (L) 08/14/2018   MCV 98.1 08/14/2018   PLT 523 (H) 08/14/2018      Chemistry      Component Value Date/Time   NA 136 08/09/2018 1217   K 4.1 08/09/2018 1217   CL 100 08/09/2018 1217   CO2 24 08/09/2018 1217   BUN 8 08/09/2018 1217   CREATININE 0.67 08/09/2018 1217      Component Value Date/Time   CALCIUM 9.2 08/09/2018 1217   ALKPHOS 190 (H) 05/23/2018 1453   AST 17 05/23/2018 1453   ALT 30 05/23/2018 1453   BILITOT 0.3 05/23/2018 1453       RADIOGRAPHIC STUDIES: I have personally reviewed the radiological images as listed and agreed with the findings in the report. Dg Chest Portable 1 View  Result Date: 08/07/2018 CLINICAL DATA:  38 year old female with shortness of breath and a prior history of pulmonary embolus EXAM: PORTABLE CHEST 1 VIEW COMPARISON:  Prior chest x-ray 05/16/2018 FINDINGS: The lungs are clear and negative for focal airspace consolidation, pulmonary edema or suspicious pulmonary nodule. No pleural effusion or pneumothorax. Cardiac and mediastinal contours are within normal limits. No acute fracture or lytic or blastic osseous lesions. The visualized upper abdominal bowel gas pattern is unremarkable. IMPRESSION: No active disease. Electronically Signed   By: Jacqulynn Cadet M.D.   On: 08/07/2018 12:15   Vas [place] Lower Extremity Venous (dvt) (only Mc & Wl 7a-7p)  Result Date: 08/09/2018  Lower Venous Study Indications: Swelling, Pain, and DVT-personal  history of. Other Indications: History of PE. May Thurner. Risk Factors: Surgery Stent of the left common external iliac vein, mechanical hormonal therapy for ongoing AUB due to fibroid. Anticoagulation: Xarelto. Limitations: Air/bowl gas. Comparison Study: 08/07/2018 and 06/19/2018 Performing Technologist: Darlina Sicilian RDCS  Examination Guidelines: A complete evaluation includes B-mode imaging, spectral Doppler, color Doppler, and power Doppler as needed of all accessible portions of each vessel. Bilateral testing is considered an integral part of a complete examination. Limited examinations for reoccurring indications may be performed as noted.  +----+---------------+---------+-----------+----------+-------+ LEFTCompressibilityPhasicitySpontaneityPropertiesSummary +----+---------------+---------+-----------+----------+-------+ CFV                Yes      Yes                          +----+---------------+---------+-----------+----------+-------+  Limited exam to visualize Iliac vein within the left leg. A left lower extremity venous was performed on 08/07/18. Left common iliac vein and left external iliac vein were patent and spontaneous. There is no evidence of acute venous thrombosus. IVC not visualized well due to air/bowel gas.    *See table(s) above for measurements and observations. Electronically signed by Harold Barban MD on 08/09/2018 at 3:31:59 PM.    Final    Vas [place] Lower Extremity Venous (dvt) (only Mc & Wl 7a-7p)  Result Date: 08/08/2018  Lower Venous Study Indications: Swelling, and Edema.  Performing Technologist: Abram Sander RVS  Examination Guidelines: A complete evaluation includes B-mode imaging, spectral Doppler, color Doppler, and power Doppler as needed of all accessible portions of each vessel. Bilateral testing is considered an integral part of a complete examination. Limited examinations for reoccurring indications may be performed as noted.   +-----+---------------+---------+-----------+----------+-------+ RIGHTCompressibilityPhasicitySpontaneityPropertiesSummary +-----+---------------+---------+-----------+----------+-------+ CFV  Full           Yes      Yes                          +-----+---------------+---------+-----------+----------+-------+   +---------+---------------+---------+-----------+----------+-------+ LEFT     CompressibilityPhasicitySpontaneityPropertiesSummary +---------+---------------+---------+-----------+----------+-------+ CFV      Full           Yes      Yes                          +---------+---------------+---------+-----------+----------+-------+ SFJ      Full                                                 +---------+---------------+---------+-----------+----------+-------+ FV Prox  Full                                                 +---------+---------------+---------+-----------+----------+-------+ FV Mid   Full                                                 +---------+---------------+---------+-----------+----------+-------+ FV DistalFull                                                 +---------+---------------+---------+-----------+----------+-------+ PFV      Full                                                 +---------+---------------+---------+-----------+----------+-------+ POP      Full           Yes      Yes                          +---------+---------------+---------+-----------+----------+-------+ PTV      Full                                                 +---------+---------------+---------+-----------+----------+-------+  PERO     Full                                                 +---------+---------------+---------+-----------+----------+-------+     Summary: Right: No evidence of common femoral vein obstruction. Left: There is no evidence of deep vein thrombosis in the lower extremity. No cystic structure found in  the popliteal fossa.  *See table(s) above for measurements and observations. Electronically signed by Ruta Hinds MD on 08/08/2018 at 9:44:43 AM.    Final     All questions were answered. The patient knows to call the clinic with any problems, questions or concerns. No barriers to learning was detected.  I spent 15 minutes counseling the patient face to face. The total time spent in the appointment was 20 minutes and more than 50% was on counseling and review of test results  Heath Lark, MD 08/15/2018 10:54 AM

## 2018-08-16 ENCOUNTER — Inpatient Hospital Stay: Payer: Medicaid Other | Admitting: Primary Care

## 2018-08-16 ENCOUNTER — Telehealth: Payer: Self-pay | Admitting: Hematology and Oncology

## 2018-08-16 NOTE — Telephone Encounter (Signed)
Called regarding schedule °

## 2018-08-17 NOTE — Progress Notes (Signed)
TELEHEALTH VIRTUAL GYNECOLOGY VISIT ENCOUNTER NOTE  I connected with Holly Graham on 08/17/18 at 10:35 AM EDT by telephone at home and verified that I am speaking with the correct person using two identifiers.   I discussed the limitations, risks, security and privacy concerns of performing an evaluation and management service by telephone and the availability of in person appointments. I also discussed with the patient that there may be a patient responsible charge related to this service. The patient expressed understanding and agreed to proceed.   History:  Holly Graham is a 38 y.o. [phone] female following up for AUB and uterine fibroids. She reports she was taking the Aygestin prescribed but it did not help any and she stopped taking it. She is not currently bleeding. States she believes she has been referred to [facility] for a hysterectomy. Has been following with Dr. Alvy Bimler of Heme/Onc for ongoing management of PE and DVT.     Past Medical History:  Diagnosis Date  . Fibroids 05/11/2018  . Pulmonary emboli [facility])    Past Surgical History:  Procedure Laterality Date  . DILATION AND CURETTAGE OF UTERUS    . INTRAVASCULAR ULTRASOUND/IVUS N/A 05/15/2018   Procedure: INTRAVASCULAR ULTRASOUND/IVUS;  Surgeon: Waynetta Sandy, MD;  Location: [facility] CV LAB;  Service: Cardiovascular;  Laterality: N/A;  . PERIPHERAL VASCULAR THROMBECTOMY N/A 05/15/2018   Procedure: PERIPHERAL VASCULAR THROMBECTOMY;  Surgeon: Waynetta Sandy, MD;  Location: [facility] LAB;  Service: Cardiovascular;  Laterality: N/A;  . TUBAL LIGATION     The following portions of the patient's history were reviewed and updated as appropriate: allergies, current medications, past family history, past medical history, past social history, past surgical history and problem list.   Health Maintenance:  Normal pap and positive HRHPV on 06/01/18.   Review of Systems:  Pertinent items noted in  HPI and remainder of comprehensive ROS otherwise negative.  Physical Exam:   General:  Alert, oriented and cooperative.   Mental Status: Normal mood and affect perceived. Normal judgment and thought content.  Physical exam deferred due to nature of the encounter  Labs and Imaging     Assessment and Plan:   1. Uterine leiomyoma, unspecified location - Patient with large fibroid uterus, previously counseled regarding abdominal hysterectomy as definitive treatment for her fibroids and menorrhagia, no improvement on megace or aygestin. Benign EMB. Had planned for surgery later this summer. Per notes, she was given lupron recently, hopefully this will improve her menorrhagia. - Patient believes she has been referred to [facility] by her Hematologist, Dr. Alvy Bimler, for concern for uterine malignancy however I do not see any visits yet scheduled (notes state she will have surgery with Gyn Onc in August). I reviewed with patient that she may be able to have surgery sooner with Gyn Oncology, and if she prefers to follow with them, would have no objections to that plan. Will follow up with her team regarding Onc referral and further management.   2. Abnormal uterine bleeding (AUB) See above    I discussed the assessment and treatment plan with the patient. The patient was provided an opportunity to ask questions and all were answered. The patient agreed with the plan and demonstrated an understanding of the instructions.   The patient was advised to call back or seek an in-person evaluation/go to the ED if the symptoms worsen or if the condition fails to improve as anticipated.  I provided 10 minutes of non-face-to-face time during this encounter.  Sloan Leiter, MD [facility] [place], [place]

## 2018-08-18 ENCOUNTER — Encounter: Payer: Self-pay | Admitting: Obstetrics and Gynecology

## 2018-08-28 ENCOUNTER — Encounter: Payer: Self-pay | Admitting: Hematology and Oncology

## 2018-09-14 ENCOUNTER — Other Ambulatory Visit: Payer: Self-pay

## 2018-09-14 ENCOUNTER — Inpatient Hospital Stay (HOSPITAL_BASED_OUTPATIENT_CLINIC_OR_DEPARTMENT_OTHER): Payer: Medicaid Other | Admitting: Hematology and Oncology

## 2018-09-14 ENCOUNTER — Encounter: Payer: Self-pay | Admitting: Hematology and Oncology

## 2018-09-14 ENCOUNTER — Inpatient Hospital Stay: Payer: Medicaid Other | Attending: Hematology and Oncology

## 2018-09-14 DIAGNOSIS — I2699 Other pulmonary embolism without acute cor pulmonale: Secondary | ICD-10-CM

## 2018-09-14 DIAGNOSIS — N939 Abnormal uterine and vaginal bleeding, unspecified: Secondary | ICD-10-CM

## 2018-09-14 DIAGNOSIS — N858 Other specified noninflammatory disorders of uterus: Secondary | ICD-10-CM | POA: Diagnosis not present

## 2018-09-14 DIAGNOSIS — Z7901 Long term (current) use of anticoagulants: Secondary | ICD-10-CM | POA: Insufficient documentation

## 2018-09-14 DIAGNOSIS — N951 Menopausal and female climacteric states: Secondary | ICD-10-CM | POA: Insufficient documentation

## 2018-09-14 DIAGNOSIS — D62 Acute posthemorrhagic anemia: Secondary | ICD-10-CM | POA: Diagnosis not present

## 2018-09-14 LAB — CBC WITH DIFFERENTIAL/PLATELET
Abs Immature Granulocytes: 0.01 10*3/uL (ref 0.00–0.07)
Basophils Absolute: 0 10*3/uL (ref 0.0–0.1)
Basophils Relative: 1 %
Eosinophils Absolute: 0.2 10*3/uL (ref 0.0–0.5)
Eosinophils Relative: 4 %
HCT: 35.8 % — ABNORMAL LOW (ref 36.0–46.0)
Hemoglobin: 11.4 g/dL — ABNORMAL LOW (ref 12.0–15.0)
Immature Granulocytes: 0 %
Lymphocytes Relative: 44 %
Lymphs Abs: 2.8 10*3/uL (ref 0.7–4.0)
MCH: 32.8 pg (ref 26.0–34.0)
MCHC: 31.8 g/dL (ref 30.0–36.0)
MCV: 102.9 fL — ABNORMAL HIGH (ref 80.0–100.0)
Monocytes Absolute: 0.3 10*3/uL (ref 0.1–1.0)
Monocytes Relative: 5 %
Neutro Abs: 2.9 10*3/uL (ref 1.7–7.7)
Neutrophils Relative %: 46 %
Platelets: 360 10*3/uL (ref 150–400)
RBC: 3.48 MIL/uL — ABNORMAL LOW (ref 3.87–5.11)
RDW: 21.8 % — ABNORMAL HIGH (ref 11.5–15.5)
WBC: 6.3 10*3/uL (ref 4.0–10.5)
nRBC: 0 % (ref 0.0–0.2)

## 2018-09-14 LAB — FERRITIN: Ferritin: 309 ng/mL — ABNORMAL HIGH (ref 11–307)

## 2018-09-14 LAB — IRON AND TIBC
Iron: 124 ug/dL (ref 41–142)
Saturation Ratios: 42 % (ref 21–57)
TIBC: 295 ug/dL (ref 236–444)
UIBC: 171 ug/dL (ref 120–384)

## 2018-09-14 MED ORDER — CITALOPRAM HYDROBROMIDE 20 MG PO TABS: 20.0000 mg | ORAL_TABLET | Freq: Every day | ORAL | 11 refills | Status: DC

## 2018-09-14 NOTE — Assessment & Plan Note (Signed)
She has significant symptoms of menopausal hot flashes I recommend trial of citalopram

## 2018-09-14 NOTE — Progress Notes (Signed)
[facility] OFFICE PROGRESS NOTE  [name], NP  ASSESSMENT & PLAN:  Bilateral pulmonary embolism ([place]) She tolerated Xarelto well I plan to repeat CT imaging in August before surgery She will continue Xarelto for now  Anemia associated with acute blood loss After intravenous iron infusion, her anemia and thrombocytosis has improved We will monitor closely with repeat blood draw again in 6 to 8 weeks.  Hot flashes due to menopause She has significant symptoms of menopausal hot flashes I recommend trial of citalopram  Uterine mass She has minimum vaginal spotting with associated cramps As previously discussed, I will refer her to GYN oncologist for complete hysterectomy I will coordinate the timing of surgery with GYN oncology with plan to repeat imaging study before her hysterectomy.   No orders of the defined types were placed in this encounter.   INTERVAL HISTORY: Holly Graham 38 y.o. female returns for further follow-up She complains of fatigue She has vaginal spotting and cramping She has significant hot flashes since her treatment with Lupron She has minimum vaginal bleeding  SUMMARY OF HEMATOLOGIC HISTORY:  Holly Graham 39 y.o. female is here because of recent diagnosis of DVT and PE  She initially presented to the emergency department at the end of January for symptom of chest pain but subsequently left without further evaluation She represented again to the emergency department on May 08, 2018 and was admitted and hospitalized until 05/18/2018. Her presentation was associated with chest pain, shortness of breath and leg swelling According to her, she has been having chest pain and shortness of breath since Christmas.  Her leg swelling was new since after the new year  She underwent multiple investigation including CT angiogram on 05/08/2018 which revealed bilateral PE with acute right ventricular strain. Urgent echocardiogram did  not reveal any signs of heart damage.  Ejection fraction is preserved.  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 She subsequently underwent ultrasound pelvis as it was thought that her uterine fibroid was compressing on the left iliac vein.  Ultrasound pelvis revealed endometrial mass She subsequently underwent MRI of the pelvis which showed large uterine mass/fibroid.  She was evaluated by gynecologist and was placed on Megace. 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  Vascular surgery and interventional radiologist were consulted.  On 05/15/2018, she underwent the following procedures: Procedure Performed: 1.  Ultrasound-guided cannulation left popliteal vein 2.  Intravascular ultrasound left femoral vein, left common femoral vein, left common external iliac veins and IVC 3.  Stent of left common external iliac veins with 14 x 120 mm Vici 4.  Central venogram 5.  Mechanical thrombectomy of left common, external iliac veins and common femoral vein with Inari Clottriever  She was anticoagulated initially with heparin and then transition to oral anticoagulation therapy. During her hospital stay, she also received a unit of blood transfusion. With her last menstrual.,  She has been bleeding since April 30, 2018.  Recently, she passed a huge clot with the size of a cantaloupe.  She show me pictures that she took. She [place] at the age of 63, her usual cycle is 3 to 4 days, for cycle 28 days, usually nonheavy Over the last 6 months, she is bleeding heavier than usual, 5 to 7 days of a cycle of every 28 days She has been placed on Megace since hospitalization She has significant lower abdominal cramp She complaining of significant fatigue despite recent blood transfusion She also complained of anorexia and occasional  postprandial nausea and vomiting. She also new onset of constipation.  She takes prune juice because without that, her bowel movement will be every 3  to 4 days She still have intermittent chest pain and shortness of breath but her leg swelling has mildly improved  She denies recent history of trauma, long distance travel, dehydration, recent surgery, smoking or prolonged immobilization. She had no prior history or diagnosis of cancer.  Her last Pap smear is more than 12 years ago.  She has not have any examination since her last child was born  The patient had been exposed to birth control pills and surgeries and never had thrombotic events. The patient had been pregnant before and denies history of peripartum thromboembolic event or history of recurrent miscarriages. There is no family history of blood clots or miscarriages. From June 22, 2018, she underwent endometrial biopsy Biopsy [id number] Endometrium, biopsy - SCANT INACTIVE ENDOMETRIUM AND SCANT ENDOCERVICAL MUCOSA. - ABUNDANT BLOOD. - NO ATYPIA OR MALIGNANCY. She was placed on Xarelto On 08/04/2018, she received one dose of Lupron plus Iv Iron   I have reviewed the past medical history, past surgical history, social history and family history with the patient and they are unchanged from previous note.  ALLERGIES:  has No Known Allergies.  MEDICATIONS:  Current Outpatient Medications  Medication Sig Dispense Refill  . acetaminophen (TYLENOL) 500 MG tablet Take 1,000 mg by mouth every 6 (six) hours as needed for mild pain.    . citalopram (CELEXA) 20 MG tablet Take 1 tablet (20 mg total) by mouth daily. 30 tablet 11  . methocarbamol (ROBAXIN) 500 MG tablet Take 1 tablet (500 mg total) by mouth 2 (two) times daily. 20 tablet 0  . ondansetron (ZOFRAN) 4 MG tablet Take 1 tablet (4 mg total) by mouth every 8 (eight) hours as needed for nausea or vomiting. 20 tablet 0  . rivaroxaban (XARELTO) 20 MG TABS tablet Take 1 tablet (20 mg total) by mouth daily with supper. 30 tablet 9   No current facility-administered medications for this visit.      REVIEW OF SYSTEMS:    Constitutional: Denies fevers, chills or night sweats Eyes: Denies blurriness of vision Ears, nose, mouth, throat, and face: Denies mucositis or sore throat Respiratory: Denies cough, dyspnea or wheezes Cardiovascular: Denies palpitation, chest discomfort or lower extremity swelling Gastrointestinal:  Denies nausea, heartburn or change in bowel habits Skin: Denies abnormal skin rashes Lymphatics: Denies new lymphadenopathy or easy bruising Neurological:Denies numbness, tingling or new weaknesses Behavioral/Psych: Mood is stable, no new changes  All other systems were reviewed with the patient and are negative.  PHYSICAL EXAMINATION: ECOG PERFORMANCE STATUS: 1 - Symptomatic but completely ambulatory  Vitals:   09/14/18 1058  BP: (!) 128/97  Pulse: 79  Resp: 18  Temp: 97.7 F (36.5 C)  SpO2: 100%   Filed Weights   09/14/18 1058  Weight: 141 lb (64 kg)    GENERAL:alert, no distress and comfortable SKIN: skin color, texture, turgor are normal, no rashes or significant lesions EYES: normal, Conjunctiva are pink and non-injected, sclera clear OROPHARYNX:no exudate, no erythema and lips, buccal mucosa, and tongue normal  NECK: supple, thyroid normal size, non-tender, without nodularity LYMPH:  no palpable lymphadenopathy in the cervical, axillary or inguinal LUNGS: clear to auscultation and percussion with normal breathing effort HEART: regular rate & rhythm and no murmurs and no lower extremity edema ABDOMEN:abdomen soft, non-tender and normal bowel sounds Musculoskeletal:no cyanosis of digits and no clubbing  NEURO: alert &  oriented x 3 with fluent speech, no focal motor/sensory deficits  LABORATORY DATA:  I have reviewed the data as listed     Component Value Date/Time   NA 136 08/09/2018 1217   K 4.1 08/09/2018 1217   CL 100 08/09/2018 1217   CO2 24 08/09/2018 1217   GLUCOSE 90 08/09/2018 1217   BUN 8 08/09/2018 1217   CREATININE 0.67 08/09/2018 1217   CALCIUM 9.2  08/09/2018 1217   PROT 7.9 05/23/2018 1453   ALBUMIN 3.4 (L) 05/23/2018 1453   AST 17 05/23/2018 1453   ALT 30 05/23/2018 1453   ALKPHOS 190 (H) 05/23/2018 1453   BILITOT 0.3 05/23/2018 1453   GFRNONAA >60 08/09/2018 1217   GFRAA >60 08/09/2018 1217    No results found for: SPEP, UPEP  Lab Results  Component Value Date   WBC 6.3 09/14/2018   NEUTROABS 2.9 09/14/2018   HGB 11.4 (L) 09/14/2018   HCT 35.8 (L) 09/14/2018   MCV 102.9 (H) 09/14/2018   PLT 360 09/14/2018      Chemistry      Component Value Date/Time   NA 136 08/09/2018 1217   K 4.1 08/09/2018 1217   CL 100 08/09/2018 1217   CO2 24 08/09/2018 1217   BUN 8 08/09/2018 1217   CREATININE 0.67 08/09/2018 1217      Component Value Date/Time   CALCIUM 9.2 08/09/2018 1217   ALKPHOS 190 (H) 05/23/2018 1453   AST 17 05/23/2018 1453   ALT 30 05/23/2018 1453   BILITOT 0.3 05/23/2018 1453       I spent 15 minutes counseling the patient face to face. The total time spent in the appointment was 20 minutes and more than 50% was on counseling.   All questions were answered. The patient knows to call the clinic with any problems, questions or concerns. No barriers to learning was detected.    Heath Lark, MD 6/18/20201:05 PM

## 2018-09-14 NOTE — Assessment & Plan Note (Signed)
She tolerated Xarelto well I plan to repeat CT imaging in August before surgery She will continue Xarelto for now

## 2018-09-14 NOTE — Assessment & Plan Note (Signed)
She has minimum vaginal spotting with associated cramps As previously discussed, I will refer her to GYN oncologist for complete hysterectomy I will coordinate the timing of surgery with GYN oncology with plan to repeat imaging study before her hysterectomy.

## 2018-09-14 NOTE — Assessment & Plan Note (Signed)
After intravenous iron infusion, her anemia and thrombocytosis has improved We will monitor closely with repeat blood draw again in 6 to 8 weeks.

## 2018-09-15 ENCOUNTER — Telehealth: Payer: Self-pay | Admitting: *Deleted

## 2018-09-15 LAB — SAMPLE TO BLOOD BANK

## 2018-09-15 NOTE — Telephone Encounter (Signed)
Called and spoke with the patient regarding her referral. Scheduled the patient to see Dr. Denman George on 7/22

## 2018-10-18 ENCOUNTER — Other Ambulatory Visit: Payer: Self-pay

## 2018-10-18 ENCOUNTER — Encounter: Payer: Self-pay | Admitting: Gynecologic Oncology

## 2018-10-18 ENCOUNTER — Inpatient Hospital Stay: Payer: Medicaid Other | Attending: Hematology and Oncology | Admitting: Gynecologic Oncology

## 2018-10-18 VITALS — BP 132/97 | HR 91 | Temp 98.0°F | Resp 18 | Ht 63.0 in | Wt 134.0 lb

## 2018-10-18 DIAGNOSIS — D390 Neoplasm of uncertain behavior of uterus: Secondary | ICD-10-CM

## 2018-10-18 DIAGNOSIS — D250 Submucous leiomyoma of uterus: Secondary | ICD-10-CM | POA: Diagnosis not present

## 2018-10-18 DIAGNOSIS — N858 Other specified noninflammatory disorders of uterus: Secondary | ICD-10-CM

## 2018-10-18 DIAGNOSIS — D251 Intramural leiomyoma of uterus: Secondary | ICD-10-CM | POA: Insufficient documentation

## 2018-10-18 MED ORDER — OXYCODONE HCL 5 MG PO TABS: 5.0000 mg | ORAL_TABLET | ORAL | 0 refills | Status: DC | PRN

## 2018-10-18 MED ORDER — ENOXAPARIN SODIUM 60 MG/0.6ML ~~LOC~~ SOLN: 1.0000 mg/kg | Freq: Two times a day (BID) | SUBCUTANEOUS | 0 refills | Status: DC

## 2018-10-18 MED ORDER — SENNOSIDES-DOCUSATE SODIUM 8.6-50 MG PO TABS: 2.0000 | ORAL_TABLET | Freq: Every day | ORAL | 1 refills | Status: DC

## 2018-10-18 NOTE — Progress Notes (Signed)
Consult Note: Gyn-Onc  Consult was requested by Dr. Alvy Bimler for the evaluation of Holly Graham 38 y.o. female  CC:  Chief Complaint  Patient presents with  . Uterine mass  . Fibroids    Assessment/Plan:  Ms. Tyrika Newman  is a 38 y.o.  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 I performed a history, physical examination, and personally reviewed the patient's imaging films including reviewing the MRI images from February, 2020.  She has had a good clinical response to Lupron therapy with cessation of menses and clinically some shrinkage of the uterus.  However persistent Lupron at her young age is not a feasible option as the sequelae of chemically induced menopause can result in long-lasting deleterious effects.  However she does require intervention to stop menstruating given her need for ongoing anticoagulant therapy and her bulky fibroid uterus.  I am therefore recommending intervention with total hysterectomy and bilateral salpingectomy.  We would recommend preserving ovaries at her age.  Based on my physical exam I believe a minimally invasive approach with robotic assistance is feasible.  The specimen will be contained and then morcellated either vaginally or by her mini laparotomy incision.  There is a low likelihood of occult malignancy given the prior biopsy result and the cessation of bleeding on Lupron.  We will obtain a preoperative MRI to evaluate the uterus for response to Lupron therapy and plan surgical approach.  We will evaluate her hemoglobin preoperatively to ensure it is optimized.  Anticoagulant therapy will need to be held preoperatively.  I recommend stopping Xarelto 7 days before surgery and changing to Lovenox, 1 mg/kg twice daily, until greater than 12 hours before surgery.  At 24 hours postoperatively we will restart therapeutic Lovenox for 7 days and if there are no bleeding complications at that time restart Xarelto  therapy.  I explained to the patient that her hysterectomy is more complex than a typical hysterectomy due to the bulky fibroid uterus, and her medical history particularly history of VTE.  She has an increased risk for complications including clot, bleeding complications, and damage to adjacent visceral structures.  I explained that if the surgery can be accomplished a mini laparotomy or vaginal morcellation then the procedure will be an outpatient procedure and she will be discharged the same day.  She was provided information regarding surgical recovery and expectations.   HPI: Ms Holly Graham is a 38 year old P3 who is seen in consultation at the request of Dr Alvy Bimler for a history of symptomatic uterine fibroids and a recent DVT/PE.  The patient experienced left lower extremity edema and then pulmonary embolism in February 2020.  She was admitted to the hospital and imaging confirmed these clot locations.  The etiology of her clot was felt to be secondary to obstruction from a bulky fibroid uterus which on CT imaging was demonstrated.  She was treated with anticoagulant therapy, and stent placement in the left external iliac vein.  She also had thrombectomy of the left common iliac vein and femoral vein.  Echocardiogram confirmed right ventricular strain but did not show signs of permanent heart damage.  The ejection fraction was preserved.  She was continued on Xarelto anticoagulant therapy after initially being treated with heparin infusion.  However she began experiencing menorrhagia while on anticoagulant therapy.  She had been treated with Megace since her hospitalization.  Endometrial biopsy in March 2020 revealed scant benign endometrial and endocervical mucosa.  She  received iron infusions intravenously for her symptomatic anemia and in April 2020 she received a Lupron injection.  Following the Depo-Lupron injection she became amenorrheic.  She also felt less bulk symptoms from her fibroid  uterus in the pelvis.  She felt less anemic.  A transvaginal ultrasound scan that had been performed in February 2020 confirmed a uterus measuring 13.4 x 8.5 x 10.1 cm with a large intramural myometrial mass consistent with a fibroid measuring 9.5 x 11.2 x 8.2 cm.  The right ovary was grossly normal with the exception of a small simple appearing cyst measuring 2.7 cm and the left ovary was normal at 3.6 x 4.3 cm.  MRI of the pelvis performed on [place] 03/18/2019 confirmed similar findings including a uterus containing multiple intramural and submucosal fibroids with total dimensions of 16 x 10.2 x 11.5 cm.  The patient is otherwise a healthy woman.  She does give a history of a strong family history of venous thromboembolic events, though no defined coagulopathy that is identified.  She has had 3 prior vaginal deliveries.  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  She lives with her children who are teenage.  Her any abdominal surgery was a tubal ligation after childbirth.  Current Meds:  Outpatient Encounter Medications as of 10/18/2018  Medication Sig  . acetaminophen (TYLENOL) 500 MG tablet Take 1,000 mg by mouth every 6 (six) hours as needed for mild pain.  . citalopram (CELEXA) 20 MG tablet Take 1 tablet (20 mg total) by mouth daily.  . ondansetron (ZOFRAN) 4 MG tablet Take 1 tablet (4 mg total) by mouth every 8 (eight) hours as needed for nausea or vomiting.  . rivaroxaban (XARELTO) 20 MG TABS tablet Take 1 tablet (20 mg total) by mouth daily with supper.  . [DISCONTINUED] methocarbamol (ROBAXIN) 500 MG tablet Take 1 tablet (500 mg total) by mouth 2 (two) times daily.   No facility-administered encounter medications on file as of 10/18/2018.     Allergy: No Known Allergies  Social Hx:   Social History   Socioeconomic History  . Marital status: Single    Spouse name: Not on file  . Number of children: Not on file  . Years of education: Not on file  . Highest education level:  Not on file  Occupational History  . Not on file  Social Needs  . Financial resource strain: Not on file  . Food insecurity    Worry: Never true    Inability: Never true  . Transportation needs    Medical: No    Non-medical: No  Tobacco Use  . Smoking status: Never Smoker  . Smokeless tobacco: Never Used  Substance and Sexual Activity  . Alcohol use: Yes  . Drug use: Never  . Sexual activity: Yes  Lifestyle  . Physical activity    Days per week: Not on file    Minutes per session: Not on file  . Stress: Not on file  Relationships  . Social Herbalist on phone: Not on file    Gets together: Not on file    Attends religious service: Not on file    Active member of club or organization: Not on file    Attends meetings of clubs or organizations: Not on file    Relationship status: Not on file  . Intimate partner violence    Fear of current or ex partner: Not on file    Emotionally abused: Not on file  Physically abused: Not on file    Forced sexual activity: Not on file  Other Topics Concern  . Not on file  Social History Narrative  . Not on file    Past Surgical Hx:  Past Surgical History:  Procedure Laterality Date  . DILATION AND CURETTAGE OF UTERUS    . INTRAVASCULAR ULTRASOUND/IVUS N/A 05/15/2018   Procedure: INTRAVASCULAR ULTRASOUND/IVUS;  Surgeon: Waynetta Sandy, MD;  Location: [facility];  Service: Cardiovascular;  Laterality: N/A;  . PERIPHERAL VASCULAR THROMBECTOMY N/A 05/15/2018   Procedure: PERIPHERAL VASCULAR THROMBECTOMY;  Surgeon: Waynetta Sandy, MD;  Location: [facility];  Service: Cardiovascular;  Laterality: N/A;  . TUBAL LIGATION      Past Medical Hx:  Past Medical History:  Diagnosis Date  . Fibroids 05/11/2018  . Pulmonary emboli [facility])     Past Gynecological History: See HPI No LMP recorded.  Family Hx:  Family History  Problem Relation Age of Onset  . Pulmonary embolism Brother   .  Diabetes Maternal Grandmother     Review of Systems:  Constitutional  Feels well,    ENT Normal appearing ears and nares bilaterally Skin/Breast  No rash, sores, jaundice, itching, dryness Cardiovascular  No chest pain, shortness of breath, or edema  Pulmonary  No cough or wheeze.  Gastro Intestinal  No nausea, vomitting, or diarrhoea. No bright red blood per rectum, no abdominal pain, change in bowel movement, or constipation.  Genito Urinary  No frequency, urgency, dysuria, amenorrhea Musculo Skeletal  No myalgia, arthralgia, joint swelling or pain  Neurologic  No weakness, numbness, change in gait,  Psychology  No depression, anxiety, insomnia.   Vitals:  Blood pressure (!) 132/97, pulse 91, temperature 98 F (36.7 C), temperature source Oral, resp. rate 18, height 5\' 3"  (1.6 m), weight 134 lb (60.8 kg), SpO2 100 %.  Physical Exam: WD in NAD Neck  Supple NROM, without any enlargements.  Lymph Node Survey No cervical supraclavicular or inguinal adenopathy Cardiovascular  Pulse normal rate, regularity and rhythm. S1 and S2 normal.  Lungs  Clear to auscultation bilateraly, without wheezes/crackles/rhonchi. Good air movement.  Skin  No rash/lesions/breakdown  Psychiatry  Alert and oriented to person, place, and time  Abdomen  Normoactive bowel sounds, abdomen soft, non-tender and obese without evidence of hernia. Mass (mobile) appreciated in lower right abdomen Back No CVA tenderness Genito Urinary  Vulva/vagina: Normal external female genitalia.  No lesions. No discharge or bleeding.  Bladder/urethra:  No lesions or masses, well supported bladder  Vagina: normal  Cervix: Normal appearing, no lesions.  Uterus: Bulky, mobile, no parametrial involvement or nodularity.  Adnexa: no discrete masses. Rectal  deferred Extremities  No bilateral cyanosis, clubbing or edema.   Thereasa Solo, MD  10/18/2018, 1:37 PM

## 2018-10-18 NOTE — Patient Instructions (Signed)
Preparing for your Surgery  Plan for surgery on November 21, 2018 with Dr. Everitt Amber at [facility] will be scheduled for a robotic assisted total hysterectomy, bilateral salpingectomy, possible minilaparotomy.   STOP XARELTO 7 DAYS BEFORE SURGERY (LAST DOSE ON November 13, 2018). PLAN TO START TAKING LOVENOX INJECTION TWICE A DAY THE DAY AFTER YOU STOP XARELTO (STARTING IN THE AM ON November 14, 2018). YOUR LAST DOSE WILL BE THE EVENING OF November 20, 2018.   Pre-operative Testing -You will receive a phone call from presurgical testing at [facility] if you have not received a call already to arrange for a pre-operative testing appointment before your surgery.  This appointment normally occurs one to two weeks before your scheduled surgery.   -Bring your insurance card, copy of an advanced directive if applicable, medication list  -At that visit, you will be asked to sign a consent for a possible blood transfusion in case a transfusion becomes necessary during surgery.  The need for a blood transfusion is rare but having consent is a necessary part of your care.     -Do not take supplements such as fish oil (omega 3), red yeast rice, tumeric before your surgery.  Day Before Surgery at [facility] will be asked to take in a light diet the day before surgery.  Avoid carbonated beverages.  You will be advised to have nothing to eat or drink after midnight the evening before.    Eat a light diet the day before surgery.  Examples including soups, broths, toast, yogurt, mashed potatoes.  Things to avoid include carbonated beverages (fizzy beverages), raw fruits and raw vegetables, or beans.   If your bowels are filled with gas, your surgeon will have difficulty visualizing your pelvic organs which increases your surgical risks.  Your role in recovery Your role is to become active as soon as directed by your doctor, while still giving yourself time to heal.  Rest when you feel tired.  You will be asked to do the following in order to speed your recovery:  - Cough and breathe deeply. This helps toclear and expand your lungs and can prevent pneumonia. You may be given a spirometer to practice deep breathing. A staff member will show you how to use the spirometer. - Do mild physical activity. Walking or moving your legs help your circulation and body functions return to normal. A staff member will help you when you try to walk and will provide you with simple exercises. Do not try to get up or walk alone the first time. - Actively manage your pain. Managing your pain lets you move in comfort. We will ask you to rate your pain on a scale of zero to 10. It is your responsibility to tell your doctor or nurse where and how much you hurt so your pain can be treated.  Special Considerations -If you are diabetic, you may be placed on insulin after surgery to have closer control over your blood sugars to promote healing and recovery.  This does not mean that you will be discharged on insulin.  If applicable, your oral antidiabetics will be resumed when you are tolerating a solid diet.  -Your final pathology results from surgery should be available around one week after surgery and the results will be relayed to you when available.  -Dr. Lahoma Crocker is the surgeon that assists your GYN Oncologist with surgery.  If you end up staying the night, the next day after  your surgery you will either see Dr. Denman George or Dr. Lahoma Crocker.  -FMLA forms can be faxed to [phone] and please allow 5-7 business days for completion.  Pain Management After Surgery -You have been prescribed your pain medication and bowel regimen medications before surgery so that you can have these available when you are discharged from the hospital. The pain medication is for use ONLY AFTER surgery and a new prescription will not be given.   -Make sure that you have Tylenol and Ibuprofen at home to use on a  regular basis after surgery for pain control. We recommend alternating the medications every hour to six hours since they work differently and are processed in the body differently for pain relief.  -Review the attached handout on narcotic use and their risks and side effects.   Bowel Regimen -You have been prescribed Sennakot-S to take nightly to prevent constipation especially if you are taking the narcotic pain medication intermittently.  It is important to prevent constipation and drink adequate amounts of liquids.  Blood Transfusion Information WHAT IS A BLOOD TRANSFUSION? A transfusion is the replacement of blood or some of its parts. Blood is made up of multiple cells which provide different functions.  Red blood cells carry oxygen and are used for blood loss replacement.  White blood cells fight against infection.  Platelets control bleeding.  Plasma helps clot blood.  Other blood products are available for specialized needs, such as hemophilia or other clotting disorders. BEFORE THE TRANSFUSION  Who gives blood for transfusions?   You may be able to donate blood to be used at a later date on yourself (autologous donation).  Relatives can be asked to donate blood. This is generally not any safer than if you have received blood from a stranger. The same precautions are taken to ensure safety when a relative's blood is donated.  Healthy volunteers who are fully evaluated to make sure their blood is safe. This is blood bank blood. Transfusion therapy is the safest it has ever been in the practice of medicine. Before blood is taken from a donor, a complete history is taken to make sure that person has no history of diseases nor engages in risky social behavior (examples are intravenous drug use or sexual activity with multiple partners). The donor's travel history is screened to minimize risk of transmitting infections, such as malaria. The donated blood is tested for signs of  infectious diseases, such as HIV and hepatitis. The blood is then tested to be sure it is compatible with you in order to minimize the chance of a transfusion reaction. If you or a relative donates blood, this is often done in anticipation of surgery and is not appropriate for emergency situations. It takes many days to process the donated blood. RISKS AND COMPLICATIONS Although transfusion therapy is very safe and saves many lives, the main dangers of transfusion include:   Getting an infectious disease.  Developing a transfusion reaction. This is an allergic reaction to something in the blood you were given. Every precaution is taken to prevent this. The decision to have a blood transfusion has been considered carefully by your caregiver before blood is given. Blood is not given unless the benefits outweigh the risks.  AFTER SURGERY CONSIDERATIONS  10/18/2018  Return to work: 4-6 weeks if applicable  Activity: 1. Be up and out of the bed during the day.  Take a nap if needed.  You may walk up steps but be careful and use the  hand rail.  Stair climbing will tire you more than you think, you may need to stop part way and rest.   2. No lifting or straining for 6 weeks.  3. No driving for 1 week(s).  Do not drive if you are taking narcotic pain medicine.  4. Shower daily.  Use soap and water on your incision and pat dry; don't rub.  No tub baths until cleared by your surgeon.   5. No sexual activity and nothing in the vagina for 8 weeks.  6. You may experience a small amount of clear drainage from your incisions, which is normal.  If the drainage persists or increases, please call the office.  7. You may experience vaginal spotting after surgery or around the 6-8 week mark from surgery when the stitches at the top of the vagina begin to dissolve.  The spotting is normal but if you experience heavy bleeding, call our office.  8. Take Tylenol or ibuprofen first for pain and only use  OXYCODONE for severe pain not relieved by the Tylenol or Ibuprofen.  Monitor your Tylenol intake to a max of 4,000 mg a day.  Diet: 1. Low sodium Heart Healthy Diet is recommended.  2. It is safe to use a laxative, such as Miralax or Colace, if you have difficulty moving your bowels. You can take Sennakot at bedtime every evening to keep bowel movements regular and to prevent constipation.    Wound Care: 1. Keep clean and dry.  Shower daily.  Reasons to call the Doctor:  Fever - Oral temperature greater than 100.4 degrees Fahrenheit  Foul-smelling vaginal discharge  Difficulty urinating  Nausea and vomiting  Increased pain at the site of the incision that is unrelieved with pain medicine.  Difficulty breathing with or without chest pain  New calf pain especially if only on one side  Sudden, continuing increased vaginal bleeding with or without clots.   Contacts: For questions or concerns you should contact:  Dr. Everitt Amber at [phone]  [name], NP at [phone]  After Hours: call [phone] and have the GYN Oncologist paged/contacted

## 2018-10-20 ENCOUNTER — Other Ambulatory Visit: Payer: Self-pay | Admitting: Gynecologic Oncology

## 2018-10-20 DIAGNOSIS — N858 Other specified noninflammatory disorders of uterus: Secondary | ICD-10-CM

## 2018-10-24 ENCOUNTER — Other Ambulatory Visit: Payer: Self-pay

## 2018-10-24 ENCOUNTER — Encounter (HOSPITAL_COMMUNITY): Payer: Self-pay | Admitting: Emergency Medicine

## 2018-10-24 ENCOUNTER — Emergency Department (HOSPITAL_COMMUNITY)
Admission: EM | Admit: 2018-10-24 | Discharge: 2018-10-24 | Discharge status: Against Medical Advice | Payer: Medicaid Other | Attending: Emergency Medicine | Admitting: Emergency Medicine

## 2018-10-24 ENCOUNTER — Emergency Department (HOSPITAL_COMMUNITY): Payer: Medicaid Other

## 2018-10-24 DIAGNOSIS — Z5321 Procedure and treatment not carried out due to patient leaving prior to being seen by health care provider: Secondary | ICD-10-CM | POA: Diagnosis not present

## 2018-10-24 DIAGNOSIS — D390 Neoplasm of uncertain behavior of uterus: Secondary | ICD-10-CM | POA: Diagnosis not present

## 2018-10-24 DIAGNOSIS — R0789 Other chest pain: Secondary | ICD-10-CM | POA: Diagnosis present

## 2018-10-24 HISTORY — DX: Acute embolism and thrombosis of unspecified deep veins of unspecified lower extremity: I82.409

## 2018-10-24 LAB — BASIC METABOLIC PANEL
Anion gap: 15 (ref 5–15)
BUN: <5 mg/dL — ABNORMAL LOW (ref 6–20)
CO2: 28 mmol/L (ref 22–32)
Calcium: 9.3 mg/dL (ref 8.9–10.3)
Chloride: 101 mmol/L (ref 98–111)
Creatinine, Ser: 0.68 mg/dL (ref 0.44–1.00)
GFR calc Af Amer: >60 mL/min (ref >60)
GFR calc non Af Amer: >60 mL/min (ref >60)
Glucose, Bld: 93 mg/dL (ref 70–99)
Potassium: 3.2 mmol/L — ABNORMAL LOW (ref 3.5–5.1)
Sodium: 144 mmol/L (ref 135–145)

## 2018-10-24 LAB — I-STAT BETA HCG BLOOD, ED (MC, WL, AP ONLY): I-stat hCG, quantitative: <5 m[IU]/mL (ref <5)

## 2018-10-24 LAB — CBC
HCT: 34.4 % — ABNORMAL LOW (ref 36.0–46.0)
Hemoglobin: 11.9 g/dL — ABNORMAL LOW (ref 12.0–15.0)
MCH: 35.3 pg — ABNORMAL HIGH (ref 26.0–34.0)
MCHC: 34.6 g/dL (ref 30.0–36.0)
MCV: 102.1 fL — ABNORMAL HIGH (ref 80.0–100.0)
Platelets: 345 10*3/uL (ref 150–400)
RBC: 3.37 MIL/uL — ABNORMAL LOW (ref 3.87–5.11)
RDW: 14.7 % (ref 11.5–15.5)
WBC: 6.1 10*3/uL (ref 4.0–10.5)
nRBC: 0 % (ref 0.0–0.2)

## 2018-10-24 LAB — TROPONIN I (HIGH SENSITIVITY): Troponin I (High Sensitivity): 4 ng/L (ref <18)

## 2018-10-24 MED ORDER — SODIUM CHLORIDE 0.9% FLUSH: 3.0000 mL | Freq: Once | INTRAVENOUS | Status: DC

## 2018-10-24 NOTE — ED Triage Notes (Signed)
C/o pain to center of chest x 1 hour with SOB.  History of PE.  Denies nausea and vomiting.

## 2018-10-25 ENCOUNTER — Ambulatory Visit (HOSPITAL_COMMUNITY)
Admission: RE | Admit: 2018-10-25 | Discharge: 2018-10-25 | Disposition: A | Discharge status: Home / Self Care | Payer: Medicaid Other | Source: Ambulatory Visit | Attending: Gynecologic Oncology | Admitting: Gynecologic Oncology

## 2018-10-25 DIAGNOSIS — N858 Other specified noninflammatory disorders of uterus: Secondary | ICD-10-CM | POA: Diagnosis present

## 2018-10-25 DIAGNOSIS — D250 Submucous leiomyoma of uterus: Secondary | ICD-10-CM | POA: Insufficient documentation

## 2018-10-25 DIAGNOSIS — D251 Intramural leiomyoma of uterus: Secondary | ICD-10-CM | POA: Diagnosis not present

## 2018-10-25 MED ORDER — GADOBUTROL 1 MMOL/ML IV SOLN
6.0000 mL | Freq: Once | INTRAVENOUS | Status: AC | PRN
  Administered 2018-10-25: 09:00:00 6 mL via INTRAVENOUS

## 2018-10-27 ENCOUNTER — Other Ambulatory Visit: Payer: Self-pay

## 2018-10-27 ENCOUNTER — Inpatient Hospital Stay (HOSPITAL_BASED_OUTPATIENT_CLINIC_OR_DEPARTMENT_OTHER): Payer: Medicaid Other | Admitting: Hematology and Oncology

## 2018-10-27 ENCOUNTER — Encounter: Payer: Self-pay | Admitting: Hematology and Oncology

## 2018-10-27 ENCOUNTER — Inpatient Hospital Stay: Payer: Medicaid Other

## 2018-10-27 ENCOUNTER — Telehealth: Payer: Self-pay | Admitting: *Deleted

## 2018-10-27 DIAGNOSIS — N858 Other specified noninflammatory disorders of uterus: Secondary | ICD-10-CM

## 2018-10-27 DIAGNOSIS — I2699 Other pulmonary embolism without acute cor pulmonale: Secondary | ICD-10-CM

## 2018-10-27 DIAGNOSIS — N939 Abnormal uterine and vaginal bleeding, unspecified: Secondary | ICD-10-CM

## 2018-10-27 DIAGNOSIS — D62 Acute posthemorrhagic anemia: Secondary | ICD-10-CM

## 2018-10-27 DIAGNOSIS — D390 Neoplasm of uncertain behavior of uterus: Secondary | ICD-10-CM | POA: Diagnosis not present

## 2018-10-27 LAB — CBC WITH DIFFERENTIAL/PLATELET
Abs Immature Granulocytes: 0.01 10*3/uL (ref 0.00–0.07)
Basophils Absolute: 0 10*3/uL (ref 0.0–0.1)
Basophils Relative: 0 %
Eosinophils Absolute: 0.1 10*3/uL (ref 0.0–0.5)
Eosinophils Relative: 3 %
HCT: 31.7 % — ABNORMAL LOW (ref 36.0–46.0)
Hemoglobin: 10.9 g/dL — ABNORMAL LOW (ref 12.0–15.0)
Immature Granulocytes: 0 %
Lymphocytes Relative: 52 %
Lymphs Abs: 2.3 10*3/uL (ref 0.7–4.0)
MCH: 34.9 pg — ABNORMAL HIGH (ref 26.0–34.0)
MCHC: 34.4 g/dL (ref 30.0–36.0)
MCV: 101.6 fL — ABNORMAL HIGH (ref 80.0–100.0)
Monocytes Absolute: 0.4 10*3/uL (ref 0.1–1.0)
Monocytes Relative: 8 %
Neutro Abs: 1.7 10*3/uL (ref 1.7–7.7)
Neutrophils Relative %: 37 %
Platelets: 300 10*3/uL (ref 150–400)
RBC: 3.12 MIL/uL — ABNORMAL LOW (ref 3.87–5.11)
RDW: 14.6 % (ref 11.5–15.5)
WBC: 4.5 10*3/uL (ref 4.0–10.5)
nRBC: 0 % (ref 0.0–0.2)

## 2018-10-27 LAB — IRON AND TIBC
Iron: 109 ug/dL (ref 41–142)
Saturation Ratios: 45 % (ref 21–57)
TIBC: 241 ug/dL (ref 236–444)
UIBC: 133 ug/dL (ref 120–384)

## 2018-10-27 LAB — SAMPLE TO BLOOD BANK

## 2018-10-27 LAB — FERRITIN: Ferritin: 257 ng/mL (ref 11–307)

## 2018-10-27 NOTE — Assessment & Plan Note (Addendum)
She has no recent vaginal bleeding MRI showed improvement and reduced size of uterine mass She is undergoing hysterectomy next month

## 2018-10-27 NOTE — Assessment & Plan Note (Signed)
She tolerated Xarelto well She will continue Xarelto for now The patient will be transitioned to Lovenox perioperatively and she agreed with the plan of care She had no recent bleeding

## 2018-10-27 NOTE — Telephone Encounter (Signed)
-----   Message from Heath Lark, MD sent at 10/27/2018 11:56 AM EDT ----- Regarding: iron studies are ok Pls call her and let her know

## 2018-10-27 NOTE — Assessment & Plan Note (Signed)
She is mildly anemic but iron study showed no evidence of iron deficiency We will observe for now

## 2018-10-27 NOTE — Progress Notes (Signed)
[place] OFFICE PROGRESS NOTE  [name], NP  ASSESSMENT & PLAN:  Bilateral pulmonary embolism (Holly Graham) She tolerated Xarelto well She will continue Xarelto for now The patient will be transitioned to Lovenox perioperatively and she agreed with the plan of care She had no recent bleeding  Uterine mass She has no recent vaginal bleeding MRI showed improvement and reduced size of uterine mass She is undergoing hysterectomy next month   Anemia associated with acute blood loss She is mildly anemic but iron study showed no evidence of iron deficiency We will observe for now   No orders of the defined types were placed in this encounter.   INTERVAL HISTORY: Holly Graham 38 y.o. female returns for further follow-up She complains of fatigue She denies recent vaginal bleeding She has seen GYN oncologist and had recent MRI which showed improvement of the size of the uterine mass The plan will be to proceed with surgery next month   SUMMARY OF HEMATOLOGIC HISTORY:  Holly Graham 38 y.o. female is here because of recent diagnosis of DVT and PE  She initially presented to the emergency department at the end of January for symptom of chest pain but subsequently left without further evaluation She represented again to the emergency department on May 08, 2018 and was admitted and hospitalized until 05/18/2018. Her presentation was associated with chest pain, shortness of breath and leg swelling According to her, she has been having chest pain and shortness of breath since Christmas.  Her leg swelling was new since after the new year  She underwent multiple investigation including CT angiogram on 05/08/2018 which revealed bilateral PE with acute right ventricular strain. Urgent echocardiogram did not reveal any signs of heart damage.  Ejection fraction is preserved.  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 She subsequently underwent  ultrasound pelvis as it was thought that her uterine fibroid was compressing on the left iliac vein.  Ultrasound pelvis revealed endometrial mass She subsequently underwent MRI of the pelvis which showed large uterine mass/fibroid.  She was evaluated by gynecologist and was placed on Megace. 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  Vascular surgery and interventional radiologist were consulted.  On 05/15/2018, she underwent the following procedures: Procedure Performed: 1.  Ultrasound-guided cannulation left popliteal vein 2.  Intravascular ultrasound left femoral vein, left common femoral vein, left common external iliac veins and IVC 3.  Stent of left common external iliac veins with 14 x 120 mm Vici 4.  Central venogram 5.  Mechanical thrombectomy of left common, external iliac veins and common femoral vein with Inari Clottriever  She was anticoagulated initially with heparin and then transition to oral anticoagulation therapy. During her hospital stay, she also received a unit of blood transfusion. With her last menstrual.,  She has been bleeding since April 30, 2018.  Recently, she passed a huge clot with the size of a cantaloupe.  She show me pictures that she took. She Minott at the age of 45, her usual cycle is 3 to 4 days, for cycle 28 days, usually nonheavy Over the last 6 months, she is bleeding heavier than usual, 5 to 7 days of a cycle of every 28 days She has been placed on Megace since hospitalization She has significant lower abdominal cramp She complaining of significant fatigue despite recent blood transfusion She also complained of anorexia and occasional postprandial nausea and vomiting. She also new onset of constipation.  She takes prune juice because without that,  her bowel movement will be every 3 to 4 days She still have intermittent chest pain and shortness of breath but her leg swelling has mildly improved  She denies recent history of trauma, long  distance travel, dehydration, recent surgery, smoking or prolonged immobilization. She had no prior history or diagnosis of cancer.  Her last Pap smear is more than 12 years ago.  She has not have any examination since her last child was born  The patient had been exposed to birth control pills and surgeries and never had thrombotic events. The patient had been pregnant before and denies history of peripartum thromboembolic event or history of recurrent miscarriages. There is no family history of blood clots or miscarriages. From June 22, 2018, she underwent endometrial biopsy Biopsy [id number] Endometrium, biopsy - SCANT INACTIVE ENDOMETRIUM AND SCANT ENDOCERVICAL MUCOSA. - ABUNDANT BLOOD. - NO ATYPIA OR MALIGNANCY. She was placed on Xarelto On 08/04/2018, she received one dose of Lupron plus Iv Iron  She had repeat MRI in July which show shrinkage of the uterine mass  I have reviewed the past medical history, past surgical history, social history and family history with the patient and they are unchanged from previous note.  ALLERGIES:  has No Known Allergies.  MEDICATIONS:  Current Outpatient Medications  Medication Sig Dispense Refill  . acetaminophen (TYLENOL) 500 MG tablet Take 1,000 mg by mouth every 6 (six) hours as needed for mild pain.    . citalopram (CELEXA) 20 MG tablet Take 1 tablet (20 mg total) by mouth daily. 30 tablet 11  . [START ON 11/14/2018] enoxaparin (LOVENOX) 60 MG/0.6ML injection Inject 0.6 mLs (60 mg total) into the skin every 12 (twelve) hours for 28 doses. 7 days preop and 7 days post op 16.8 mL 0  . ondansetron (ZOFRAN) 4 MG tablet Take 1 tablet (4 mg total) by mouth every 8 (eight) hours as needed for nausea or vomiting. 20 tablet 0  . oxyCODONE (OXY IR/ROXICODONE) 5 MG immediate release tablet Take 1 tablet (5 mg total) by mouth every 4 (four) hours as needed for severe pain. For AFTER surgery, do not take and drive 15 tablet 0  . rivaroxaban (XARELTO) 20  MG TABS tablet Take 1 tablet (20 mg total) by mouth daily with supper. 30 tablet 9  . senna-docusate (SENOKOT-S) 8.6-50 MG tablet Take 2 tablets by mouth at bedtime. For AFTER surgery, do not take if having diarrhea 30 tablet 1   No current facility-administered medications for this visit.      REVIEW OF SYSTEMS:   Constitutional: Denies fevers, chills or night sweats Eyes: Denies blurriness of vision Ears, nose, mouth, throat, and face: Denies mucositis or sore throat Respiratory: Denies cough, dyspnea or wheezes Cardiovascular: Denies palpitation, chest discomfort or lower extremity swelling Gastrointestinal:  Denies nausea, heartburn or change in bowel habits Skin: Denies abnormal skin rashes Lymphatics: Denies new lymphadenopathy or easy bruising Neurological:Denies numbness, tingling or new weaknesses Behavioral/Psych: Mood is stable, no new changes  All other systems were reviewed with the patient and are negative.  PHYSICAL EXAMINATION: ECOG PERFORMANCE STATUS: 1 - Symptomatic but completely ambulatory  Vitals:   10/27/18 0920  BP: (!) 152/107  Pulse: 77  Resp: 18  Temp: 98.5 F (36.9 C)  SpO2: 100%   Filed Weights   10/27/18 0920  Weight: 135 lb 12.8 oz (61.6 kg)    GENERAL:alert, no distress and comfortable Musculoskeletal:no cyanosis of digits and no clubbing  NEURO: alert & oriented x 3 with fluent  speech, no focal motor/sensory deficits  LABORATORY DATA:  I have reviewed the data as listed     Component Value Date/Time   NA 144 10/24/2018 0101   K 3.2 (L) 10/24/2018 0101   CL 101 10/24/2018 0101   CO2 28 10/24/2018 0101   GLUCOSE 93 10/24/2018 0101   BUN <5 (L) 10/24/2018 0101   CREATININE 0.68 10/24/2018 0101   CALCIUM 9.3 10/24/2018 0101   PROT 7.9 05/23/2018 1453   ALBUMIN 3.4 (L) 05/23/2018 1453   AST 17 05/23/2018 1453   ALT 30 05/23/2018 1453   ALKPHOS 190 (H) 05/23/2018 1453   BILITOT 0.3 05/23/2018 1453   GFRNONAA >60 10/24/2018 0101    GFRAA >60 10/24/2018 0101    No results found for: SPEP, UPEP  Lab Results  Component Value Date   WBC 4.5 10/27/2018   NEUTROABS 1.7 10/27/2018   HGB 10.9 (L) 10/27/2018   HCT 31.7 (L) 10/27/2018   MCV 101.6 (H) 10/27/2018   PLT 300 10/27/2018      Chemistry      Component Value Date/Time   NA 144 10/24/2018 0101   K 3.2 (L) 10/24/2018 0101   CL 101 10/24/2018 0101   CO2 28 10/24/2018 0101   BUN <5 (L) 10/24/2018 0101   CREATININE 0.68 10/24/2018 0101      Component Value Date/Time   CALCIUM 9.3 10/24/2018 0101   ALKPHOS 190 (H) 05/23/2018 1453   AST 17 05/23/2018 1453   ALT 30 05/23/2018 1453   BILITOT 0.3 05/23/2018 1453       I spent 10 minutes counseling the patient face to face. The total time spent in the appointment was 15 minutes and more than 50% was on counseling.   All questions were answered. The patient knows to call the clinic with any problems, questions or concerns. No barriers to learning was detected.    Heath Lark, MD 7/31/20202:22 PM

## 2018-10-27 NOTE — Telephone Encounter (Signed)
Telephone call to patient and advised lab results.

## 2018-10-31 ENCOUNTER — Telehealth: Payer: Self-pay

## 2018-10-31 NOTE — Telephone Encounter (Signed)
Told [name] that the MRI shows that the fibroids are shrinking with the Lupron therapy.  Proceed as planned with surgery per Melissa CrossNP. Pt verbalized understanding.

## 2018-11-13 ENCOUNTER — Telehealth: Payer: Self-pay | Admitting: Oncology

## 2018-11-13 NOTE — Telephone Encounter (Signed)
Called [employer] and reviewed that she should take her last dose of Xarelto today, start Lovenox tomorrow am.  Also that her last dose of Lovenox will be on 11/20/18 PM.  She verbalized understanding and agreement.

## 2018-11-15 NOTE — Patient Instructions (Addendum)
DUE TO COVID-19 ONLY ONE VISITOR IS ALLOWED TO COME WITH YOU AND STAY IN THE WAITING ROOM ONLY DURING PRE OP AND PROCEDURE. THE ONE VISITOR MAY VISIT WITH YOU IN YOUR PRIVATE ROOM DURING VISITOR HOURS ONLY!!   COVID SWAB TESTING MUST BE COMPLETED ON: Friday, Aug. 21, 2020 at  2:50PM.  [address] [place] -[facility] enter pre surgical testing line (Must self quarantine after testing. Follow instructions on handout.)             Your procedure is scheduled on: Tuesday, Aug. 25, 2020   Report to [facility] Main  Entrance    Report to admitting at 8:30 AM   Call this number if you have problems the morning of surgery [phone]   Monday, Aug 24: Light diet the day before (avoid gas producing foods)   Do not eat food:After Midnight.   May have liquids until 7:30AM day of surgery   CLEAR LIQUID DIET  Foods Allowed                                                                     Foods Excluded  Water, Black Coffee and tea, regular and decaf                             liquids that you cannot  Plain Jell-O in any flavor  (No red)                                           see through such as: Fruit ices (not with fruit pulp)                                     milk, soups, orange juice  Iced Popsicles (No red)                                    All solid food Carbonated beverages, regular and diet                                    Apple juices Sports drinks like Gatorade (No red) Lightly seasoned clear broth or consume(fat free) Sugar, honey syrup  Sample Menu Breakfast                                Lunch                                     Supper Cranberry juice                    Beef broth  Chicken broth Jell-O                                     Grape juice                           Apple juice Coffee or tea                        Jell-O                                      Popsicle                                                 Coffee or tea                        Coffee or tea    Brush your teeth the morning of surgery.   Do NOT smoke after Midnight   Take these medicines the morning of surgery with A SIP OF WATER: NONE              You may not have any metal on your body including hair pins, jewelry, and body piercings             Do not wear make-up, lotions, powders, perfumes/cologne, or deodorant             Do not wear nail polish.  Do not shave  48 hours prior to surgery.                Do not bring valuables to the hospital. [place].   Contacts, dentures or bridgework may not be worn into surgery.   Bring small overnight bag day of surgery.    Patients discharged the day of surgery will not be allowed to drive home.   Special Instructions: Bring a copy of your healthcare power of attorney and living will documents         the day of surgery if you haven't scanned them in before.              Please read over the following fact sheets you were given:  [facility] - Preparing for Surgery Before surgery, you can play an important role.  Because skin is not sterile, your skin needs to be as free of germs as possible.  You can reduce the number of germs on your skin by washing with CHG (chlorahexidine gluconate) soap before surgery.  CHG is an antiseptic cleaner which kills germs and bonds with the skin to continue killing germs even after washing. Please DO NOT use if you have an allergy to CHG or antibacterial soaps.  If your skin becomes reddened/irritated stop using the CHG and inform your nurse when you arrive at Short Stay. Do not shave (including legs and underarms) for at least 48 hours prior to the first CHG shower.  You may shave your face/neck.  Please follow these instructions carefully:  1.  Shower  with CHG Soap the night before surgery and the  morning of surgery.  2.  If you choose to wash your hair, wash your hair first  as usual with your normal  shampoo.  3.  After you shampoo, rinse your hair and body thoroughly to remove the shampoo.                             4.  Use CHG as you would any other liquid soap.  You can apply chg directly to the skin and wash.  Gently with a scrungie or clean washcloth.  5.  Apply the CHG Soap to your body ONLY FROM THE NECK DOWN.   Do   not use on face/ open                           Wound or open sores. Avoid contact with eyes, ears mouth and   genitals (private parts).                       Wash face,  Genitals (private parts) with your normal soap.             6.  Wash thoroughly, paying special attention to the area where your    surgery  will be performed.  7.  Thoroughly rinse your body with warm water from the neck down.  8.  DO NOT shower/wash with your normal soap after using and rinsing off the CHG Soap.                9.  Pat yourself dry with a clean towel.            10.  Wear clean pajamas.            11.  Place clean sheets on your bed the night of your first shower and do not  sleep with pets. Day of Surgery : Do not apply any lotions/deodorants the morning of surgery.  Please wear clean clothes to the hospital/surgery center.  FAILURE TO FOLLOW THESE INSTRUCTIONS MAY RESULT IN THE CANCELLATION OF YOUR SURGERY  PATIENT SIGNATURE_________________________________  NURSE SIGNATURE__________________________________  ________________________________________________________________________  Holly Graham  An incentive spirometer is a tool that can help keep your lungs clear and active. This tool measures how well you are filling your lungs with each breath. Taking long deep breaths may help reverse or decrease the chance of developing breathing (pulmonary) problems (especially infection) following:  A long period of time when you are unable to move or be active. BEFORE THE PROCEDURE   If the spirometer includes an indicator to show your best effort,  your nurse or respiratory therapist will set it to a desired goal.  If possible, sit up straight or lean slightly forward. Try not to slouch.  Hold the incentive spirometer in an upright position. INSTRUCTIONS FOR USE  1. Sit on the edge of your bed if possible, or sit up as far as you can in bed or on a chair. 2. Hold the incentive spirometer in an upright position. 3. Breathe out normally. 4. Place the mouthpiece in your mouth and seal your lips tightly around it. 5. Breathe in slowly and as deeply as possible, raising the piston or the ball toward the top of the column. 6. Hold your breath for 3-5 seconds or for as long as possible. Allow the piston or ball  to fall to the bottom of the column. 7. Remove the mouthpiece from your mouth and breathe out normally. 8. Rest for a few seconds and repeat Steps 1 through 7 at least 10 times every 1-2 hours when you are awake. Take your time and take a few normal breaths between deep breaths. 9. The spirometer may include an indicator to show your best effort. Use the indicator as a goal to work toward during each repetition. 10. After each set of 10 deep breaths, practice coughing to be sure your lungs are clear. If you have an incision (the cut made at the time of surgery), support your incision when coughing by placing a pillow or rolled up towels firmly against it. Once you are able to get out of bed, walk around indoors and cough well. You may stop using the incentive spirometer when instructed by your caregiver.  RISKS AND COMPLICATIONS  Take your time so you do not get dizzy or light-headed.  If you are in pain, you may need to take or ask for pain medication before doing incentive spirometry. It is harder to take a deep breath if you are having pain. AFTER USE  Rest and breathe slowly and easily.  It can be helpful to keep track of a log of your progress. Your caregiver can provide you with a simple table to help with this. If you are  using the spirometer at home, follow these instructions: [place] IF:   You are having difficultly using the spirometer.  You have trouble using the spirometer as often as instructed.  Your pain medication is not giving enough relief while using the spirometer.  You develop fever of 100.5 F (38.1 C) or higher. SEEK IMMEDIATE MEDICAL CARE IF:   You cough up bloody sputum that had not been present before.  You develop fever of 102 F (38.9 C) or greater.  You develop worsening pain at or near the incision site. MAKE SURE YOU:   Understand these instructions.  Will watch your condition.  Will get help right away if you are not doing well or get worse. Document Released: 07/26/2006 Document Revised: 06/07/2011 Document Reviewed: 09/26/2006 ExitCare Patient Information 2014 ExitCare, [place].   ________________________________________________________________________  WHAT IS A BLOOD TRANSFUSION? Blood Transfusion Information  A transfusion is the replacement of blood or some of its parts. Blood is made up of multiple cells which provide different functions.  Red blood cells carry oxygen and are used for blood loss replacement.  White blood cells fight against infection.  Platelets control bleeding.  Plasma helps clot blood.  Other blood products are available for specialized needs, such as hemophilia or other clotting disorders. BEFORE THE TRANSFUSION  Who gives blood for transfusions?   Healthy volunteers who are fully evaluated to make sure their blood is safe. This is blood bank blood. Transfusion therapy is the safest it has ever been in the practice of medicine. Before blood is taken from a donor, a complete history is taken to make sure that person has no history of diseases nor engages in risky social behavior (examples are intravenous drug use or sexual activity with multiple partners). The donor's travel history is screened to minimize risk of transmitting  infections, such as malaria. The donated blood is tested for signs of infectious diseases, such as HIV and hepatitis. The blood is then tested to be sure it is compatible with you in order to minimize the chance of a transfusion reaction. If you or a  relative donates blood, this is often done in anticipation of surgery and is not appropriate for emergency situations. It takes many days to process the donated blood. RISKS AND COMPLICATIONS Although transfusion therapy is very safe and saves many lives, the main dangers of transfusion include:   Getting an infectious disease.  Developing a transfusion reaction. This is an allergic reaction to something in the blood you were given. Every precaution is taken to prevent this. The decision to have a blood transfusion has been considered carefully by your caregiver before blood is given. Blood is not given unless the benefits outweigh the risks. AFTER THE TRANSFUSION  Right after receiving a blood transfusion, you will usually feel much better and more energetic. This is especially true if your red blood cells have gotten low (anemic). The transfusion raises the level of the red blood cells which carry oxygen, and this usually causes an energy increase.  The nurse administering the transfusion will monitor you carefully for complications. HOME CARE INSTRUCTIONS  No special instructions are needed after a transfusion. You may find your energy is better. Speak with your caregiver about any limitations on activity for underlying diseases you may have. SEEK MEDICAL CARE IF:   Your condition is not improving after your transfusion.  You develop redness or irritation at the intravenous (IV) site. SEEK IMMEDIATE MEDICAL CARE IF:  Any of the following symptoms occur over the next 12 hours:  Shaking chills.  You have a temperature by mouth above 102 F (38.9 C), not controlled by medicine.  Chest, back, or muscle pain.  People around you feel you are  not acting correctly or are confused.  Shortness of breath or difficulty breathing.  Dizziness and fainting.  You get a rash or develop hives.  You have a decrease in urine output.  Your urine turns a dark color or changes to pink, red, or brown. Any of the following symptoms occur over the next 10 days:  You have a temperature by mouth above 102 F (38.9 C), not controlled by medicine.  Shortness of breath.  Weakness after normal activity.  The white part of the eye turns yellow (jaundice).  You have a decrease in the amount of urine or are urinating less often.  Your urine turns a dark color or changes to pink, red, or brown. Document Released: 03/12/2000 Document Revised: 06/07/2011 Document Reviewed: 10/30/2007 [facility] Patient Information 2014 [place], [place].  _______________________________________________________________________

## 2018-11-15 NOTE — Progress Notes (Signed)
ekg 10-24-2018 epic   Echo 05-08-2018 epic   cxr 10-24-2018 epic   Peripheral  and cardiac cath 05-15-2018 epic   Tele note including xarelto and bridging instructions 11-13-2018 epic

## 2018-11-16 ENCOUNTER — Other Ambulatory Visit: Payer: Self-pay

## 2018-11-16 ENCOUNTER — Encounter (INDEPENDENT_AMBULATORY_CARE_PROVIDER_SITE_OTHER): Payer: Self-pay

## 2018-11-16 ENCOUNTER — Encounter (HOSPITAL_COMMUNITY): Payer: Self-pay

## 2018-11-16 ENCOUNTER — Encounter (HOSPITAL_COMMUNITY)
Admission: RE | Admit: 2018-11-16 | Discharge: 2018-11-16 | Disposition: A | Discharge status: Home / Self Care | Payer: Medicaid Other | Source: Ambulatory Visit | Attending: Gynecologic Oncology | Admitting: Gynecologic Oncology

## 2018-11-16 DIAGNOSIS — Z86718 Personal history of other venous thrombosis and embolism: Secondary | ICD-10-CM | POA: Insufficient documentation

## 2018-11-16 DIAGNOSIS — Z7901 Long term (current) use of anticoagulants: Secondary | ICD-10-CM | POA: Diagnosis not present

## 2018-11-16 DIAGNOSIS — D259 Leiomyoma of uterus, unspecified: Secondary | ICD-10-CM | POA: Diagnosis not present

## 2018-11-16 DIAGNOSIS — Z9851 Tubal ligation status: Secondary | ICD-10-CM | POA: Diagnosis not present

## 2018-11-16 DIAGNOSIS — Z86711 Personal history of pulmonary embolism: Secondary | ICD-10-CM | POA: Diagnosis not present

## 2018-11-16 DIAGNOSIS — Z01818 Encounter for other preprocedural examination: Secondary | ICD-10-CM | POA: Insufficient documentation

## 2018-11-16 DIAGNOSIS — D250 Submucous leiomyoma of uterus: Secondary | ICD-10-CM | POA: Diagnosis not present

## 2018-11-16 DIAGNOSIS — N939 Abnormal uterine and vaginal bleeding, unspecified: Secondary | ICD-10-CM | POA: Diagnosis present

## 2018-11-16 DIAGNOSIS — Z79899 Other long term (current) drug therapy: Secondary | ICD-10-CM | POA: Insufficient documentation

## 2018-11-16 DIAGNOSIS — D251 Intramural leiomyoma of uterus: Secondary | ICD-10-CM | POA: Diagnosis not present

## 2018-11-16 DIAGNOSIS — Z832 Family history of diseases of the blood and blood-forming organs and certain disorders involving the immune mechanism: Secondary | ICD-10-CM | POA: Diagnosis not present

## 2018-11-16 DIAGNOSIS — D509 Iron deficiency anemia, unspecified: Secondary | ICD-10-CM | POA: Diagnosis not present

## 2018-11-16 HISTORY — DX: Iron deficiency anemia, unspecified: D50.9

## 2018-11-16 HISTORY — DX: Personal history of other medical treatment: Z92.89

## 2018-11-16 LAB — URINALYSIS, ROUTINE W REFLEX MICROSCOPIC
Glucose, UA: NEGATIVE mg/dL
Hgb urine dipstick: NEGATIVE
Ketones, ur: 5 mg/dL — AB
Nitrite: NEGATIVE
Protein, ur: 100 mg/dL — AB
Specific Gravity, Urine: 1.027 (ref 1.005–1.030)
WBC, UA: >50 WBC/hpf — ABNORMAL HIGH (ref 0–5)
pH: 6 (ref 5.0–8.0)

## 2018-11-16 LAB — COMPREHENSIVE METABOLIC PANEL
ALT: 14 U/L (ref 0–44)
AST: 34 U/L (ref 15–41)
Albumin: 4.5 g/dL (ref 3.5–5.0)
Alkaline Phosphatase: 90 U/L (ref 38–126)
Anion gap: 13 (ref 5–15)
BUN: 10 mg/dL (ref 6–20)
CO2: 30 mmol/L (ref 22–32)
Calcium: 9.3 mg/dL (ref 8.9–10.3)
Chloride: 98 mmol/L (ref 98–111)
Creatinine, Ser: 0.64 mg/dL (ref 0.44–1.00)
GFR calc Af Amer: >60 mL/min (ref >60)
GFR calc non Af Amer: >60 mL/min (ref >60)
Glucose, Bld: 91 mg/dL (ref 70–99)
Potassium: 3.4 mmol/L — ABNORMAL LOW (ref 3.5–5.1)
Sodium: 141 mmol/L (ref 135–145)
Total Bilirubin: 0.7 mg/dL (ref 0.3–1.2)
Total Protein: 8.3 g/dL — ABNORMAL HIGH (ref 6.5–8.1)

## 2018-11-16 LAB — CBC
HCT: 35.8 % — ABNORMAL LOW (ref 36.0–46.0)
Hemoglobin: 12 g/dL (ref 12.0–15.0)
MCH: 35.9 pg — ABNORMAL HIGH (ref 26.0–34.0)
MCHC: 33.5 g/dL (ref 30.0–36.0)
MCV: 107.2 fL — ABNORMAL HIGH (ref 80.0–100.0)
Platelets: 382 10*3/uL (ref 150–400)
RBC: 3.34 MIL/uL — ABNORMAL LOW (ref 3.87–5.11)
RDW: 14.7 % (ref 11.5–15.5)
WBC: 5.7 10*3/uL (ref 4.0–10.5)
nRBC: 0 % (ref 0.0–0.2)

## 2018-11-16 NOTE — Progress Notes (Signed)
SPOKE W/  Kyler     SCREENING SYMPTOMS OF COVID 19:   COUGH--NO  RUNNY NOSE--- NO  SORE THROAT---NO  NASAL CONGESTION----NO  SNEEZING----NO  SHORTNESS OF BREATH---NO  DIFFICULTY BREATHING---NO TEMP >100.0 -----NO  UNEXPLAINED BODY ACHES------NO  CHILLS -------- NO  HEADACHES ---------NO  LOSS OF SMELL/ TASTE --------NO    HAVE YOU OR ANY FAMILY MEMBER TRAVELLED PAST 14 DAYS OUT OF THE   COUNTY---NO STATE----NO COUNTRY----NO  HAVE YOU OR ANY FAMILY MEMBER BEEN EXPOSED TO ANYONE WITH COVID 19? NO

## 2018-11-17 ENCOUNTER — Other Ambulatory Visit (HOSPITAL_COMMUNITY)
Admission: RE | Admit: 2018-11-17 | Discharge: 2018-11-17 | Disposition: A | Discharge status: Home / Self Care | Payer: Medicaid Other | Source: Ambulatory Visit | Attending: Gynecologic Oncology | Admitting: Gynecologic Oncology

## 2018-11-17 DIAGNOSIS — Z01812 Encounter for preprocedural laboratory examination: Secondary | ICD-10-CM | POA: Insufficient documentation

## 2018-11-17 DIAGNOSIS — Z20828 Contact with and (suspected) exposure to other viral communicable diseases: Secondary | ICD-10-CM | POA: Diagnosis not present

## 2018-11-17 LAB — URINE CULTURE: Culture: NO GROWTH

## 2018-11-17 LAB — SARS CORONAVIRUS 2 (TAT 6-24 HRS): SARS Coronavirus 2: NEGATIVE

## 2018-11-20 ENCOUNTER — Telehealth: Payer: Self-pay

## 2018-11-20 NOTE — Telephone Encounter (Signed)
I spoke to [name] this am. She verbalized that she is all set for surgery tomorrow. She has stopped taking Xarelto and has been taking lovenox BID.  She was instructed to take her pm lovenox injection tonigh at least 12 hours before surgery. She verbalized understanding and states that she takes her pm shot around 8 pm.

## 2018-11-20 NOTE — Anesthesia Preprocedure Evaluation (Addendum)
Anesthesia Evaluation  Patient identified by MRN, date of birth, ID band Patient awake    Reviewed: Allergy & Precautions, NPO status , Patient's Chart, lab work & pertinent test results  Airway Mallampati: I  TM Distance: >3 FB Neck ROM: Full    Dental   Pulmonary    Pulmonary exam normal        Cardiovascular Normal cardiovascular exam     Neuro/Psych    GI/Hepatic   Endo/Other    Renal/GU      Musculoskeletal   Abdominal   Peds  Hematology   Anesthesia Other Findings   Reproductive/Obstetrics                             Anesthesia Physical Anesthesia Plan  ASA: III  Anesthesia Plan: General   Post-op Pain Management:    Induction: Intravenous  PONV Risk Score and Plan: 3 and Ondansetron, Midazolam and Treatment may vary due to age or medical condition  Airway Management Planned: Oral ETT  Additional Equipment:   Intra-op Plan:   Post-operative Plan: Extubation in OR  Informed Consent: I have reviewed the patients History and Physical, chart, labs and discussed the procedure including the risks, benefits and alternatives for the proposed anesthesia with the patient or authorized representative who has indicated his/her understanding and acceptance.       Plan Discussed with: CRNA and Surgeon  Anesthesia Plan Comments: (See PAT note 11/16/2018, Konrad Felix, PA-C)       Anesthesia Quick Evaluation

## 2018-11-20 NOTE — Progress Notes (Signed)
Anesthesia Chart Review   Case: [id number] Date/Time: 11/21/18 1015   Procedure: XI ROBOTIC ASSISTED TOTAL HYSTERECTOMY WITH BILATERAL SALPINGECTOMY GREATER THAN 250 GRAMS, POSSIBLE MINI LAPAROMTY (N/A )   Anesthesia type: General   Pre-op diagnosis: FIBROID UTERUS   Location: [place] [facility]   Surgeon: Everitt Amber, MD      DISCUSSION:37 y.o. 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 Everitt Amber.   Last seen by hematology/oncology 10/27/2018.  Pt stable at this visit, anticoagulant instructions given.  Last dose of Xarelto 11/13/2018 starting Lovenox bridge.  Last dose of Lovenox 11/20/2018.    Anticipate pt can proceed with planned procedure barring acute status change.   VS: BP 120/77   Pulse 87   Temp 36.8 C (Oral)   Resp 16   Ht 5\' 3"  (1.6 m)   Wt 61.6 kg   SpO2 100%   BMI 24.06 kg/m   PROVIDERS: [name], NP is PCP   Heath Lark, MD is Hematologist/Oncologist  LABS: Labs reviewed: Acceptable for surgery. and Reviewed by [name], NP (all labs ordered are listed, but only abnormal results are displayed)  Labs Reviewed  CBC - Abnormal; Notable for the following components:      Result Value   RBC 3.34 (*)    HCT 35.8 (*)    MCV 107.2 (*)    MCH 35.9 (*)    All other components within normal limits  COMPREHENSIVE METABOLIC PANEL - Abnormal; Notable for the following components:   Potassium 3.4 (*)    Total Protein 8.3 (*)    All other components within normal limits  URINALYSIS, ROUTINE W REFLEX MICROSCOPIC - Abnormal; Notable for the following components:   Color, Urine AMBER (*)    APPearance CLOUDY (*)    Bilirubin Urine SMALL (*)    Ketones, ur 5 (*)    Protein, ur 100 (*)    Leukocytes,Ua LARGE (*)    WBC, UA >50 (*)    Bacteria, UA FEW (*)    All other components within normal limits  TYPE AND SCREEN      IMAGES:   EKG: 10/25/2018 Rate 111 bpm Sinus tachycardia Right superior axis deviation   CV: Echo 05/08/2018 IMPRESSIONS   1. The left ventricle has normal systolic function of 0000000. The cavity size was normal. There is no increased left ventricular wall thickness. Echo evidence of impaired diastolic relaxation.  2. The right ventricle has normal systolic function. The cavity was normal. There is no increase in right ventricular wall thickness.  3. The mitral valve is normal in structure. There is mild thickening.  4. The tricuspid valve is normal in structure. Tricuspid valve regurgitation is moderate.  5. The aortic valve is normal in structure.  6. The pulmonic valve was normal in structure.  7. The inferior vena cava was normal in size with <50% respiratory variability. Past Medical History:  Diagnosis Date  . DVT (deep venous thrombosis) ([place])   . Fibroids 05/11/2018  . History of blood transfusion   . Iron deficiency anemia   . Pulmonary emboli ([facility])    Bilateral    Past Surgical History:  Procedure Laterality Date  . DILATION AND CURETTAGE OF UTERUS    . INTRAVASCULAR ULTRASOUND/IVUS N/A 05/15/2018   Procedure: INTRAVASCULAR ULTRASOUND/IVUS;  Surgeon: Waynetta Sandy, MD;  Location: [facility];  Service: Cardiovascular;  Laterality: N/A;  . PERIPHERAL VASCULAR THROMBECTOMY N/A 05/15/2018   Procedure: PERIPHERAL VASCULAR THROMBECTOMY;  Surgeon: Waynetta Sandy, MD;  Location: [facility];  Service: Cardiovascular;  Laterality: N/A;  . TUBAL LIGATION      MEDICATIONS: . acetaminophen (TYLENOL) 500 MG tablet  . Carboxymethylcellul-Glycerin (LUBRICATING EYE DROPS OP)  . citalopram (CELEXA) 20 MG tablet  . enoxaparin (LOVENOX) 60 MG/0.6ML injection  . ondansetron (ZOFRAN) 4 MG tablet  . oxyCODONE (OXY IR/ROXICODONE) 5 MG immediate release tablet  . rivaroxaban (XARELTO) 20 MG TABS tablet  . senna-docusate (SENOKOT-S) 8.6-50 MG  tablet   No current facility-administered medications for this encounter.     [name] [facility] Pre-Surgical Testing [phone] 11/20/18  12:33 PM

## 2018-11-21 ENCOUNTER — Other Ambulatory Visit: Payer: Self-pay

## 2018-11-21 ENCOUNTER — Ambulatory Visit (HOSPITAL_COMMUNITY)
Admission: RE | Admit: 2018-11-21 | Discharge: 2018-11-21 | Disposition: A | Discharge status: Home / Self Care | Payer: Medicaid Other | Attending: Gynecologic Oncology | Admitting: Gynecologic Oncology

## 2018-11-21 ENCOUNTER — Encounter (HOSPITAL_COMMUNITY): Payer: Self-pay

## 2018-11-21 ENCOUNTER — Ambulatory Visit (HOSPITAL_COMMUNITY): Payer: Medicaid Other | Admitting: Anesthesiology

## 2018-11-21 ENCOUNTER — Ambulatory Visit (HOSPITAL_COMMUNITY): Payer: Medicaid Other | Admitting: Physician Assistant

## 2018-11-21 ENCOUNTER — Encounter (HOSPITAL_COMMUNITY)
Admission: RE | Disposition: A | Discharge status: Home / Self Care | Payer: Self-pay | Source: Home / Self Care | Attending: Gynecologic Oncology

## 2018-11-21 DIAGNOSIS — D509 Iron deficiency anemia, unspecified: Secondary | ICD-10-CM | POA: Diagnosis not present

## 2018-11-21 DIAGNOSIS — N858 Other specified noninflammatory disorders of uterus: Secondary | ICD-10-CM

## 2018-11-21 DIAGNOSIS — D250 Submucous leiomyoma of uterus: Secondary | ICD-10-CM | POA: Insufficient documentation

## 2018-11-21 DIAGNOSIS — Z832 Family history of diseases of the blood and blood-forming organs and certain disorders involving the immune mechanism: Secondary | ICD-10-CM | POA: Insufficient documentation

## 2018-11-21 DIAGNOSIS — Z7901 Long term (current) use of anticoagulants: Secondary | ICD-10-CM | POA: Insufficient documentation

## 2018-11-21 DIAGNOSIS — Z9851 Tubal ligation status: Secondary | ICD-10-CM | POA: Insufficient documentation

## 2018-11-21 DIAGNOSIS — Z79899 Other long term (current) drug therapy: Secondary | ICD-10-CM | POA: Insufficient documentation

## 2018-11-21 DIAGNOSIS — N939 Abnormal uterine and vaginal bleeding, unspecified: Secondary | ICD-10-CM | POA: Insufficient documentation

## 2018-11-21 DIAGNOSIS — Z86711 Personal history of pulmonary embolism: Secondary | ICD-10-CM | POA: Insufficient documentation

## 2018-11-21 DIAGNOSIS — D62 Acute posthemorrhagic anemia: Secondary | ICD-10-CM | POA: Diagnosis present

## 2018-11-21 DIAGNOSIS — I2699 Other pulmonary embolism without acute cor pulmonale: Secondary | ICD-10-CM | POA: Diagnosis present

## 2018-11-21 DIAGNOSIS — D251 Intramural leiomyoma of uterus: Secondary | ICD-10-CM | POA: Diagnosis not present

## 2018-11-21 DIAGNOSIS — D219 Benign neoplasm of connective and other soft tissue, unspecified: Secondary | ICD-10-CM | POA: Diagnosis present

## 2018-11-21 DIAGNOSIS — Z86718 Personal history of other venous thrombosis and embolism: Secondary | ICD-10-CM | POA: Insufficient documentation

## 2018-11-21 HISTORY — PX: ROBOTIC ASSISTED TOTAL HYSTERECTOMY WITH BILATERAL SALPINGO OOPHERECTOMY: SHX6086

## 2018-11-21 LAB — TYPE AND SCREEN
ABO/RH(D): B POS
Antibody Screen: NEGATIVE
Unit division: 0
Unit division: 0

## 2018-11-21 LAB — BPAM RBC
Blood Product Expiration Date: 202009052359
Blood Product Expiration Date: 202009082359
Unit Type and Rh: 7300
Unit Type and Rh: 7300

## 2018-11-21 LAB — PREGNANCY, URINE: Preg Test, Ur: NEGATIVE

## 2018-11-21 SURGERY — HYSTERECTOMY, TOTAL, ROBOT-ASSISTED, LAPAROSCOPIC, WITH BILATERAL SALPINGO-OOPHORECTOMY
Anesthesia: General

## 2018-11-21 MED ORDER — HYDROMORPHONE HCL 1 MG/ML IJ SOLN
0.5000 mg | INTRAMUSCULAR | Status: DC | PRN
  Administered 2018-11-21: 0.5 mg via INTRAVENOUS

## 2018-11-21 MED ORDER — LACTATED RINGERS IR SOLN
Status: DC | PRN
  Administered 2018-11-21: 1000 mL

## 2018-11-21 MED ORDER — ROCURONIUM BROMIDE 10 MG/ML (PF) SYRINGE
PREFILLED_SYRINGE | INTRAVENOUS | Status: DC | PRN
  Administered 2018-11-21: 60 mg via INTRAVENOUS
  Administered 2018-11-21: 5 mg via INTRAVENOUS

## 2018-11-21 MED ORDER — PROPOFOL 10 MG/ML IV BOLUS
INTRAVENOUS | Status: DC | PRN
  Administered 2018-11-21: 120 mg via INTRAVENOUS

## 2018-11-21 MED ORDER — SCOPOLAMINE 1 MG/3DAYS TD PT72
1.0000 | MEDICATED_PATCH | TRANSDERMAL | Status: DC
  Administered 2018-11-21: 1.5 mg via TRANSDERMAL
  Filled 2018-11-21: qty 1

## 2018-11-21 MED ORDER — ONDANSETRON HCL 4 MG/2ML IJ SOLN
INTRAMUSCULAR | Status: AC
  Filled 2018-11-21: qty 2

## 2018-11-21 MED ORDER — DEXAMETHASONE SODIUM PHOSPHATE 10 MG/ML IJ SOLN
INTRAMUSCULAR | Status: AC
  Filled 2018-11-21: qty 1

## 2018-11-21 MED ORDER — FENTANYL CITRATE (PF) 100 MCG/2ML IJ SOLN
INTRAMUSCULAR | Status: AC
  Filled 2018-11-21: qty 2

## 2018-11-21 MED ORDER — BUPIVACAINE HCL (PF) 0.25 % IJ SOLN
INTRAMUSCULAR | Status: AC
  Filled 2018-11-21: qty 30

## 2018-11-21 MED ORDER — MIDAZOLAM HCL 2 MG/2ML IJ SOLN
INTRAMUSCULAR | Status: DC | PRN
  Administered 2018-11-21: 2 mg via INTRAVENOUS

## 2018-11-21 MED ORDER — ROCURONIUM BROMIDE 10 MG/ML (PF) SYRINGE
PREFILLED_SYRINGE | INTRAVENOUS | Status: AC
  Filled 2018-11-21: qty 10

## 2018-11-21 MED ORDER — PROPOFOL 10 MG/ML IV BOLUS
INTRAVENOUS | Status: AC
  Filled 2018-11-21: qty 20

## 2018-11-21 MED ORDER — PHENYLEPHRINE 40 MCG/ML (10ML) SYRINGE FOR IV PUSH (FOR BLOOD PRESSURE SUPPORT)
PREFILLED_SYRINGE | INTRAVENOUS | Status: DC | PRN
  Administered 2018-11-21: 120 ug via INTRAVENOUS
  Administered 2018-11-21: 80 ug via INTRAVENOUS
  Administered 2018-11-21: 120 ug via INTRAVENOUS

## 2018-11-21 MED ORDER — CEFAZOLIN SODIUM-DEXTROSE 2-4 GM/100ML-% IV SOLN
2.0000 g | INTRAVENOUS | Status: AC
  Administered 2018-11-21: 2 g via INTRAVENOUS
  Filled 2018-11-21: qty 100

## 2018-11-21 MED ORDER — PHENYLEPHRINE 40 MCG/ML (10ML) SYRINGE FOR IV PUSH (FOR BLOOD PRESSURE SUPPORT)
PREFILLED_SYRINGE | INTRAVENOUS | Status: AC
  Filled 2018-11-21: qty 10

## 2018-11-21 MED ORDER — DEXAMETHASONE SODIUM PHOSPHATE 4 MG/ML IJ SOLN: 4.0000 mg | INTRAMUSCULAR | Status: DC

## 2018-11-21 MED ORDER — LIDOCAINE 2% (20 MG/ML) 5 ML SYRINGE
INTRAMUSCULAR | Status: DC | PRN
  Administered 2018-11-21: 100 mg via INTRAVENOUS

## 2018-11-21 MED ORDER — LIDOCAINE 2% (20 MG/ML) 5 ML SYRINGE
INTRAMUSCULAR | Status: AC
  Filled 2018-11-21: qty 5

## 2018-11-21 MED ORDER — FENTANYL CITRATE (PF) 250 MCG/5ML IJ SOLN
INTRAMUSCULAR | Status: DC | PRN
  Administered 2018-11-21: 50 ug via INTRAVENOUS
  Administered 2018-11-21: 150 ug via INTRAVENOUS
  Administered 2018-11-21 (×4): 50 ug via INTRAVENOUS

## 2018-11-21 MED ORDER — HYDROMORPHONE HCL 1 MG/ML IJ SOLN
INTRAMUSCULAR | Status: AC
  Filled 2018-11-21: qty 1

## 2018-11-21 MED ORDER — ONDANSETRON HCL 4 MG/2ML IJ SOLN
INTRAMUSCULAR | Status: DC | PRN
  Administered 2018-11-21: 4 mg via INTRAVENOUS

## 2018-11-21 MED ORDER — FENTANYL CITRATE (PF) 250 MCG/5ML IJ SOLN
INTRAMUSCULAR | Status: AC
  Filled 2018-11-21: qty 5

## 2018-11-21 MED ORDER — ACETAMINOPHEN 500 MG PO TABS
1000.0000 mg | ORAL_TABLET | ORAL | Status: AC
  Administered 2018-11-21: 1000 mg via ORAL
  Filled 2018-11-21: qty 2

## 2018-11-21 MED ORDER — LACTATED RINGERS IV SOLN
INTRAVENOUS | Status: DC
  Administered 2018-11-21: 1000 mL via INTRAVENOUS
  Administered 2018-11-21 (×2): via INTRAVENOUS

## 2018-11-21 MED ORDER — PHENYLEPHRINE HCL (PRESSORS) 10 MG/ML IV SOLN: INTRAVENOUS | Status: DC | PRN

## 2018-11-21 MED ORDER — BUPIVACAINE HCL 0.25 % IJ SOLN
INTRAMUSCULAR | Status: DC | PRN
  Administered 2018-11-21: 20 mL

## 2018-11-21 MED ORDER — MIDAZOLAM HCL 2 MG/2ML IJ SOLN
INTRAMUSCULAR | Status: AC
  Filled 2018-11-21: qty 2

## 2018-11-21 MED ORDER — SUGAMMADEX SODIUM 200 MG/2ML IV SOLN
INTRAVENOUS | Status: DC | PRN
  Administered 2018-11-21: 140 mg via INTRAVENOUS

## 2018-11-21 MED ORDER — CELECOXIB 200 MG PO CAPS
400.0000 mg | ORAL_CAPSULE | ORAL | Status: AC
  Administered 2018-11-21: 10:00:00 400 mg via ORAL
  Filled 2018-11-21: qty 2

## 2018-11-21 MED ORDER — STERILE WATER FOR IRRIGATION IR SOLN
Status: DC | PRN
  Administered 2018-11-21: 1000 mL

## 2018-11-21 MED ORDER — DEXAMETHASONE SODIUM PHOSPHATE 10 MG/ML IJ SOLN
INTRAMUSCULAR | Status: DC | PRN
  Administered 2018-11-21: 10 mg via INTRAVENOUS

## 2018-11-21 MED ORDER — GABAPENTIN 300 MG PO CAPS
300.0000 mg | ORAL_CAPSULE | ORAL | Status: AC
  Administered 2018-11-21: 300 mg via ORAL
  Filled 2018-11-21: qty 1

## 2018-11-21 MED ORDER — MEPERIDINE HCL 50 MG/ML IJ SOLN: 6.2500 mg | INTRAMUSCULAR | Status: DC | PRN

## 2018-11-21 MED ORDER — ONDANSETRON HCL 4 MG/2ML IJ SOLN: 4.0000 mg | Freq: Once | INTRAMUSCULAR | Status: DC | PRN

## 2018-11-21 MED ORDER — SODIUM CHLORIDE 0.9% FLUSH: 3.0000 mL | Freq: Two times a day (BID) | INTRAVENOUS | Status: DC

## 2018-11-21 MED ORDER — HYDROMORPHONE HCL 1 MG/ML IJ SOLN
0.2500 mg | INTRAMUSCULAR | Status: DC | PRN
  Administered 2018-11-21 (×4): 0.5 mg via INTRAVENOUS

## 2018-11-21 SURGICAL SUPPLY — 56 items
APPLICATOR SURGIFLO ENDO (HEMOSTASIS) IMPLANT
BAG LAPAROSCOPIC 12 15 PORT 16 (BASKET) IMPLANT
BAG RETRIEVAL 12/15 (BASKET)
BLADE SURG SZ11 CARB STEEL (BLADE) ×2 IMPLANT
COVER BACK TABLE 60X90IN (DRAPES) ×2 IMPLANT
COVER TIP SHEARS 8 DVNC (MISCELLANEOUS) ×1 IMPLANT
COVER TIP SHEARS 8MM DA VINCI (MISCELLANEOUS) ×1
COVER WAND RF STERILE (DRAPES) IMPLANT
DECANTER SPIKE VIAL GLASS SM (MISCELLANEOUS) IMPLANT
DERMABOND ADVANCED (GAUZE/BANDAGES/DRESSINGS) ×1
DERMABOND ADVANCED .7 DNX12 (GAUZE/BANDAGES/DRESSINGS) ×1 IMPLANT
DRAPE ARM DVNC X/XI (DISPOSABLE) ×4 IMPLANT
DRAPE COLUMN DVNC XI (DISPOSABLE) ×1 IMPLANT
DRAPE DA VINCI XI ARM (DISPOSABLE) ×4
DRAPE DA VINCI XI COLUMN (DISPOSABLE) ×1
DRAPE SHEET LG 3/4 BI-LAMINATE (DRAPES) ×2 IMPLANT
DRAPE SURG IRRIG POUCH 19X23 (DRAPES) ×2 IMPLANT
DRSG TEGADERM 4X4.75 (GAUZE/BANDAGES/DRESSINGS) ×2 IMPLANT
ELECT REM PT RETURN 15FT ADLT (MISCELLANEOUS) ×2 IMPLANT
GLOVE BIO SURGEON STRL SZ 6 (GLOVE) ×8 IMPLANT
GLOVE BIO SURGEON STRL SZ 6.5 (GLOVE) IMPLANT
GOWN STRL REUS W/ TWL LRG LVL3 (GOWN DISPOSABLE) ×4 IMPLANT
GOWN STRL REUS W/TWL LRG LVL3 (GOWN DISPOSABLE) ×4
HOLDER FOLEY CATH W/STRAP (MISCELLANEOUS) ×2 IMPLANT
IRRIG SUCT STRYKERFLOW 2 WTIP (MISCELLANEOUS) ×2
IRRIGATION SUCT STRKRFLW 2 WTP (MISCELLANEOUS) ×1 IMPLANT
KIT PROCEDURE DA VINCI SI (MISCELLANEOUS)
KIT PROCEDURE DVNC SI (MISCELLANEOUS) IMPLANT
KIT TURNOVER KIT A (KITS) IMPLANT
MANIPULATOR UTERINE 4.5 ZUMI (MISCELLANEOUS) ×2 IMPLANT
NEEDLE HYPO 22GX1.5 SAFETY (NEEDLE) IMPLANT
NEEDLE SPNL 18GX3.5 QUINCKE PK (NEEDLE) IMPLANT
OBTURATOR OPTICAL STANDARD 8MM (TROCAR) ×1
OBTURATOR OPTICAL STND 8 DVNC (TROCAR) ×1
OBTURATOR OPTICALSTD 8 DVNC (TROCAR) ×1 IMPLANT
PACK ROBOT GYN CUSTOM WL (TRAY / TRAY PROCEDURE) ×2 IMPLANT
PAD POSITIONING PINK XL (MISCELLANEOUS) ×2 IMPLANT
PORT ACCESS TROCAR AIRSEAL 12 (TROCAR) ×1 IMPLANT
PORT ACCESS TROCAR AIRSEAL 5M (TROCAR) ×1
POUCH SPECIMEN RETRIEVAL 10MM (ENDOMECHANICALS) IMPLANT
SEAL CANN UNIV 5-8 DVNC XI (MISCELLANEOUS) ×3 IMPLANT
SEAL XI 5MM-8MM UNIVERSAL (MISCELLANEOUS) ×3
SET TRI-LUMEN FLTR TB AIRSEAL (TUBING) ×2 IMPLANT
SURGIFLO W/THROMBIN 8M KIT (HEMOSTASIS) IMPLANT
SUT VIC AB 0 CT1 27 (SUTURE) ×1
SUT VIC AB 0 CT1 27XBRD ANTBC (SUTURE) ×1 IMPLANT
SUT VIC AB 3-0 SH 27 (SUTURE) ×1
SUT VIC AB 3-0 SH 27XBRD (SUTURE) ×1 IMPLANT
SUT VIC AB 4-0 PS2 18 (SUTURE) ×4 IMPLANT
SYR 10ML LL (SYRINGE) IMPLANT
TOWEL OR NON WOVEN STRL DISP B (DISPOSABLE) ×2 IMPLANT
TRAP SPECIMEN MUCOUS 40CC (MISCELLANEOUS) IMPLANT
TRAY FOLEY MTR SLVR 16FR STAT (SET/KITS/TRAYS/PACK) ×2 IMPLANT
UNDERPAD 30X30 (UNDERPADS AND DIAPERS) ×2 IMPLANT
WATER STERILE IRR 1000ML POUR (IV SOLUTION) ×2 IMPLANT
YANKAUER SUCT BULB TIP 10FT TU (MISCELLANEOUS) ×2 IMPLANT

## 2018-11-21 NOTE — Anesthesia Postprocedure Evaluation (Signed)
Anesthesia Post Note  Patient: Holly Graham  Procedure(s) Performed: XI ROBOTIC ASSISTED TOTAL HYSTERECTOMY WITH BILATERAL SALPINGECTOMY GREATER THAN 250 GRAMS (N/A )     Patient location during evaluation: PACU Anesthesia Type: General Level of consciousness: awake and alert Pain management: pain level controlled Vital Signs Assessment: post-procedure vital signs reviewed and stable Respiratory status: spontaneous breathing, nonlabored ventilation, respiratory function stable and patient connected to nasal cannula oxygen Cardiovascular status: blood pressure returned to baseline and stable Postop Assessment: no apparent nausea or vomiting Anesthetic complications: no    Last Vitals:  Vitals:   11/21/18 1500 11/21/18 1538  BP:  (!) 137/98  Pulse:  82  Resp:  18  Temp: (!) 36.4 C 36.4 C  SpO2: 93% 99%    Last Pain:  Vitals:   11/21/18 1538  TempSrc: Oral  PainSc: 5                  Viva Gallaher DAVID

## 2018-11-21 NOTE — Op Note (Signed)
OPERATIVE NOTE 11/21/18  Surgeon: Donaciano Eva   Assistants: Dr Lahoma Crocker (an MD assistant was necessary for tissue manipulation, management of robotic instrumentation, retraction and positioning due to the complexity of the case and hospital policies).   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   Estimated Blood Loss:  <20cc      Total IV Fluids: 800 ml         Specimens: uterus with cervix and bilateral fallopian tubes         Complications:  None; patient tolerated the procedure well.         Disposition: PACU - hemodynamically stable.  Procedure Details  The patient was seen in the Holding Room. The risks, benefits, complications, treatment options, and expected outcomes were discussed with the patient.  The patient concurred with the proposed plan, giving informed consent.  The site of surgery properly noted/marked. The patient was identified as Biochemist, clinical and the procedure verified as a Robotic-assisted hysterectomy >250gm with bilateral salpingectomy. A Time Out was held and the above information confirmed.  After induction of anesthesia, the patient was draped and prepped in the usual sterile manner. Pt was placed in supine position after anesthesia and draped and prepped in the usual sterile manner. The abdominal drape was placed after the CholoraPrep had been allowed to dry for 3 minutes.  Her arms were tucked to her side with all appropriate precautions.  The shoulders were  stabilized with padded shoulder blocks applied to the acromium processes.  The patient was placed in the semi-lithotomy position in [place].  The perineum was prepped with Betadine. The patient was then prepped. Foley catheter was placed.  A sterile speculum was placed in the vagina.  The cervix was grasped with a single-tooth tenaculum and dilated with Kennon Rounds dilators.  The ZUMI uterine manipulator with a medium colpotomizer ring was placed without difficulty.  A pneum occluder balloon was placed over the manipulator.  OG tube placement was confirmed and to suction.   Next, a 5 mm skin incision was made 1 cm below the subcostal margin in the midclavicular line.  The 5 mm Optiview port and scope was used for direct entry.  Opening pressure was under 10 mm CO2.  The abdomen was insufflated and the findings were noted as above.   At this point and all points during the procedure, the patient's intra-abdominal pressure did not exceed 15 mmHg. Next, a 10 mm skin incision was made in the umbilicus and a right and left port was placed about 10 cm lateral to the robot port on the right and left side. All ports were placed under direct visualization.  The patient was placed in steep Trendelenburg.  Bowel was folded away into the upper abdomen.  The robot was docked in the normal manner.  The hysterectomy was started after the round ligament on the right side was incised and the retroperitoneum was entered and the pararectal space was developed.  The ureter was noted to be on the medial leaf of the broad ligament.  The peritoneum above the ureter was incised and stretched and  the  Utero-ovarian ligament was skeletonized, cauterized and cut. The fallopian tube was dissected free from the ovary and remained with the uterus. The posterior peritoneum was taken down to the level of the KOH ring.  The posterior peritoneum was incised across the back of the cul de sac and the rectovaginal septum was developed. The  anterior peritoneum was also taken down.  The bladder flap was created to the level 5cm below the level of the KOH ring.  The uterine artery on the right side was skeletonized, cauterized and cut in the normal manner.  A similar procedure was performed on the left.  The colpotomy was made in a cruciate manner to accommodate the large uterus and the uterus, cervix, bilateral tubes were amputated and delivered through the vagina.  Pedicles were inspected and excellent hemostasis was achieved.    The colpotomy at the vaginal cuff was closed with Vicryl on a CT1 needle in a running manner (closing the anterior and posterior vaginal incisions first, then the transverse).  Irrigation was used and excellent hemostasis was achieved.  At this point in the procedure was completed.  Robotic instruments were removed under direct visulaization.  The robot was undocked. The 10 mm ports were closed with Vicryl on a UR-5 needle and the fascia was closed with 0 Vicryl on a UR-5 needle.  The skin was closed with 4-0 Vicryl in a subcuticular manner.  Dermabond was applied.  Sponge, lap and needle counts correct x 2.  The patient was taken to the recovery room in stable condition.  The vagina was swabbed with  minimal bleeding noted at the right introitus that was made hemostatic with a running 3-0 vicryl.   All instrument and needle counts were correct x  3.   The patient was transferred to the recovery room in a stable condition.  Donaciano Eva, MD

## 2018-11-21 NOTE — Transfer of Care (Signed)
Immediate Anesthesia Transfer of Care Note  Patient: Holly Graham  Procedure(s) Performed: XI ROBOTIC ASSISTED TOTAL HYSTERECTOMY WITH BILATERAL SALPINGECTOMY GREATER THAN 250 GRAMS (N/A )  Patient Location: PACU  Anesthesia Type:General  Level of Consciousness: drowsy  Airway & Oxygen Therapy: Patient Spontanous Breathing and Patient connected to face mask oxygen  Post-op Assessment: Report given to RN and Post -op Vital signs reviewed and stable  Post vital signs: Reviewed and stable  Last Vitals:  Vitals Value Taken Time  BP    Temp    Pulse 100 11/21/18 1243  Resp 21 11/21/18 1243  SpO2 100 % 11/21/18 1243  Vitals shown include unvalidated device data.  Last Pain:  Vitals:   11/21/18 0925  PainSc: 0-No pain         Complications: No apparent anesthesia complications

## 2018-11-21 NOTE — H&P (Signed)
Consult Note: Gyn-Onc  Consult was requested by Dr. Alvy Bimler for the evaluation of Holly Graham 38 y.o. 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   Assessment/Plan:  Holly Graham  is a 38 y.o.  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 I reviewed her MRI from this month.   She has had a good clinical response to Lupron therapy with cessation of menses and clinically some shrinkage of the uterus.  However persistent Lupron at her young age is not a feasible option as the sequelae of chemically induced menopause can result in long-lasting deleterious effects.  However she does require intervention to stop menstruating given her need for ongoing anticoagulant therapy and her bulky fibroid uterus.  I am therefore recommending intervention with total hysterectomy and bilateral salpingectomy.  We would recommend preserving ovaries at her age.  Based on my physical exam I believe a minimally invasive approach with robotic assistance is feasible.  The specimen will be contained and then morcellated either vaginally or by her mini laparotomy incision.  There is a low likelihood of occult malignancy given the prior biopsy result and the cessation of bleeding on Lupron.  She stopped Xarelto 7 days before surgery and changed to Lovenox, 1 mg/kg twice daily, (with last dose 12 hours before surgery).  At 24 hours postoperatively we will restart therapeutic Lovenox for 7 days and if there are no bleeding complications at that time restart Xarelto therapy.  I explained to the patient that her hysterectomy is more complex than a typical hysterectomy due to the bulky fibroid uterus, and her medical history particularly history of VTE.  She has an increased risk for complications including clot, bleeding complications,  and damage to adjacent visceral structures.  I explained that if the surgery can be accomplished a mini laparotomy or vaginal morcellation then the procedure will be an outpatient procedure and she will be discharged the same day.  She was provided information regarding surgical recovery and expectations.  HPI: Holly Graham is a 38 year old P3 who is seen in consultation at the request of Dr Alvy Bimler for a history of symptomatic uterine fibroids and a recent DVT/PE.  The patient experienced left lower extremity edema and then pulmonary embolism in February 2020.  She was admitted to the hospital and imaging confirmed these clot locations.  The etiology of her clot was felt to be secondary to obstruction from a bulky fibroid uterus which on CT imaging was demonstrated.  She was treated with anticoagulant therapy, and stent placement in the left external iliac vein.  She also had thrombectomy of the left common iliac vein and femoral vein.  Echocardiogram confirmed right ventricular strain but did not show signs of permanent heart damage.  The ejection fraction was preserved.  She was continued on Xarelto anticoagulant therapy after initially being treated with heparin infusion.  However she began experiencing menorrhagia while on anticoagulant therapy.  She had been treated with Megace since her hospitalization.  Endometrial biopsy in March 2020 revealed scant benign endometrial and endocervical mucosa.  She received iron infusions intravenously for her symptomatic anemia and in April 2020 she received a Lupron injection.  Following the Depo-Lupron injection she became amenorrheic.  She also felt less bulk symptoms from her fibroid uterus in the pelvis.  She felt less anemic.  A transvaginal ultrasound scan  that had been performed in February 2020 confirmed a uterus measuring 13.4 x 8.5 x 10.1 cm with a large intramural myometrial mass consistent with a fibroid measuring 9.5 x 11.2 x 8.2 cm.  The right  ovary was grossly normal with the exception of a small simple appearing cyst measuring 2.7 cm and the left ovary was normal at 3.6 x 4.3 cm.  MRI of the pelvis performed on [place] 03/18/2019 confirmed similar findings including a uterus containing multiple intramural and submucosal fibroids with total dimensions of 16 x 10.2 x 11.5 cm.  The patient is otherwise a healthy woman.  She does give a history of a strong family history of venous thromboembolic events, though no defined coagulopathy that is identified.  She has had 3 prior vaginal deliveries.  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  She lives with her children who are teenage.  Her any abdominal surgery was a tubal ligation after childbirth.  Interval Hx:  MRI on 7/29 showed a uterus measuring 12.0 by 7.8 by 9.5 cm which had decreased from 16.0 x10.2 x 11.5 cm. She has no residual clot seen.  CBC    Component Value Date/Time   WBC 5.7 11/16/2018 1203   RBC 3.34 (L) 11/16/2018 1203   HGB 12.0 11/16/2018 1203   HCT 35.8 (L) 11/16/2018 1203   PLT 382 11/16/2018 1203   MCV 107.2 (H) 11/16/2018 1203   MCH 35.9 (H) 11/16/2018 1203   MCHC 33.5 11/16/2018 1203   RDW 14.7 11/16/2018 1203   LYMPHSABS 2.3 10/27/2018 0908   MONOABS 0.4 10/27/2018 0908   EOSABS 0.1 10/27/2018 0908   BASOSABS 0.0 10/27/2018 0908     Current Meds:  Outpatient Encounter Medications as of 10/18/2018  Medication Sig  . acetaminophen (TYLENOL) 500 MG tablet Take 1,000 mg by mouth every 6 (six) hours as needed for mild pain.  . citalopram (CELEXA) 20 MG tablet Take 1 tablet (20 mg total) by mouth daily.  . ondansetron (ZOFRAN) 4 MG tablet Take 1 tablet (4 mg total) by mouth every 8 (eight) hours as needed for nausea or vomiting.  . rivaroxaban (XARELTO) 20 MG TABS tablet Take 1 tablet (20 mg total) by mouth daily with supper.  . [DISCONTINUED] methocarbamol (ROBAXIN) 500 MG tablet Take 1 tablet (500 mg total) by mouth 2 (two) times daily.   No  facility-administered encounter medications on file as of 10/18/2018.     Allergy: No Known Allergies  Social Hx:   Social History   Socioeconomic History  . Marital status: Single    Spouse name: Not on file  . Number of children: Not on file  . Years of education: Not on file  . Highest education level: Not on file  Occupational History  . Not on file  Social Needs  . Financial resource strain: Not on file  . Food insecurity    Worry: Never true    Inability: Never true  . Transportation needs    Medical: No    Non-medical: No  Tobacco Use  . Smoking status: Never Smoker  . Smokeless tobacco: Never Used  Substance and Sexual Activity  . Alcohol use: Yes  . Drug use: Never  . Sexual activity: Yes    Birth control/protection: Injection  Lifestyle  . Physical activity    Days per week: Not on file    Minutes per session: Not on file  . Stress: Not on file  Relationships  . Social connections  Talks on phone: Not on file    Gets together: Not on file    Attends religious service: Not on file    Active member of club or organization: Not on file    Attends meetings of clubs or organizations: Not on file    Relationship status: Not on file  . Intimate partner violence    Fear of current or ex partner: Not on file    Emotionally abused: Not on file    Physically abused: Not on file    Forced sexual activity: Not on file  Other Topics Concern  . Not on file  Social History Narrative  . Not on file    Past Surgical Hx:  Past Surgical History:  Procedure Laterality Date  . DILATION AND CURETTAGE OF UTERUS    . INTRAVASCULAR ULTRASOUND/IVUS N/A 05/15/2018   Procedure: INTRAVASCULAR ULTRASOUND/IVUS;  Surgeon: Waynetta Sandy, MD;  Location: [facility];  Service: Cardiovascular;  Laterality: N/A;  . PERIPHERAL VASCULAR THROMBECTOMY N/A 05/15/2018   Procedure: PERIPHERAL VASCULAR THROMBECTOMY;  Surgeon: Waynetta Sandy, MD;  Location: [facility];  Service: Cardiovascular;  Laterality: N/A;  . TUBAL LIGATION      Past Medical Hx:  Past Medical History:  Diagnosis Date  . DVT (deep venous thrombosis) ([place])   . Fibroids 05/11/2018  . History of blood transfusion   . Iron deficiency anemia   . Pulmonary emboli ([facility])    Bilateral    Past Gynecological History: See HPI Patient's last menstrual period was 08/21/2018.  Family Hx:  Family History  Problem Relation Age of Onset  . Pulmonary embolism Brother   . Diabetes Maternal Grandmother     Review of Systems:  Constitutional  Feels well,    ENT Normal appearing ears and nares bilaterally Skin/Breast  No rash, sores, jaundice, itching, dryness Cardiovascular  No chest pain, shortness of breath, or edema  Pulmonary  No cough or wheeze.  Gastro Intestinal  No nausea, vomitting, or diarrhoea. No bright red blood per rectum, no abdominal pain, change in bowel movement, or constipation.  Genito Urinary  No frequency, urgency, dysuria, amenorrhea Musculo Skeletal  No myalgia, arthralgia, joint swelling or pain  Neurologic  No weakness, numbness, change in gait,  Psychology  No depression, anxiety, insomnia.   Vitals:  Height 5\' 3"  (1.6 m), weight 135 lb 12.8 oz (61.6 kg), last menstrual period 08/21/2018.  Physical Exam: WD in NAD Neck  Supple NROM, without any enlargements.  Lymph Node Survey No cervical supraclavicular or inguinal adenopathy Cardiovascular  Pulse normal rate, regularity and rhythm. S1 and S2 normal.  Lungs  Clear to auscultation bilateraly, without wheezes/crackles/rhonchi. Good air movement.  Skin  No rash/lesions/breakdown  Psychiatry  Alert and oriented to person, place, and time  Abdomen  Normoactive bowel sounds, abdomen soft, non-tender and obese without evidence of hernia. Mass (mobile) appreciated in lower right abdomen Back No CVA tenderness Genito Urinary  Vulva/vagina: Normal external female genitalia.  No  lesions. No discharge or bleeding.  Bladder/urethra:  No lesions or masses, well supported bladder  Vagina: normal  Cervix: Normal appearing, no lesions.  Uterus: Bulky, mobile, no parametrial involvement or nodularity.  Adnexa: no discrete masses. Rectal  deferred Extremities  No bilateral cyanosis, clubbing or edema.   Thereasa Solo, MD  11/21/2018, 9:49 AM

## 2018-11-21 NOTE — Anesthesia Procedure Notes (Signed)
Procedure Name: Intubation Date/Time: 11/21/2018 10:38 AM Performed by: Sharlette Dense, CRNA Patient Re-evaluated:Patient Re-evaluated prior to induction Oxygen Delivery Method: Circle system utilized Preoxygenation: Pre-oxygenation with 100% oxygen Induction Type: IV induction Ventilation: Mask ventilation without difficulty and Oral airway inserted - appropriate to patient size Laryngoscope Size: Mac and 4 Grade View: Grade I Tube type: Oral Tube size: 7.5 mm Number of attempts: 2 Airway Equipment and Method: Stylet Placement Confirmation: ETT inserted through vocal cords under direct vision,  positive ETCO2 and breath sounds checked- equal and bilateral Secured at: 21 cm Tube secured with: Tape Dental Injury: Teeth and Oropharynx as per pre-operative assessment  Comments: First attempt made by EMT Ellard Artis

## 2018-11-21 NOTE — Discharge Instructions (Signed)
11/21/2018  Return to work: 4 weeks  Activity: 1. Be up and out of the bed during the day.  Take a nap if needed.  You may walk up steps but be careful and use the hand rail.  Stair climbing will tire you more than you think, you may need to stop part way and rest.   2. No lifting or straining for 4 weeks.  3. No driving for 1 weeks.  Do Not drive if you are taking narcotic pain medicine.  4. Shower daily.  Use soap and water on your incision and pat dry; don't rub.   5. No sexual activity and nothing in the vagina for 8 weeks.  Medications:  - Take ibuprofen and tylenol first line for pain control. Take these regularly (every 6 hours) to decrease the build up of pain.  - If necessary, for severe pain not relieved by ibuprofen, take percocet.  - While taking percocet you should take sennakot every night to reduce the likelihood of constipation. If this causes diarrhea, stop its use.  - 24 hours postop (11/22/18) you can begin taking the lovenox shots twice a day. Do not take any on the day of surgery. Continue to take the lovenox shots until 1 week postop (September 1st) at which time you can stop the lovenox and restart taking the xarelto. Do not start taking xarelto before 11/28/18.  Diet: 1. Low sodium Heart Healthy Diet is recommended.  2. It is safe to use a laxative if you have difficulty moving your bowels.   Wound Care: 1. Keep clean and dry.  Shower daily.  Reasons to call the Doctor:   Fever - Oral temperature greater than 100.4 degrees Fahrenheit  Foul-smelling vaginal discharge  Difficulty urinating  Nausea and vomiting  Increased pain at the site of the incision that is unrelieved with pain medicine.  Difficulty breathing with or without chest pain  New calf pain especially if only on one side  Sudden, continuing increased vaginal bleeding with or without clots.   Follow-up: 1. See Everitt Amber in 4 weeks.  Contacts: For questions or concerns you should  contact:  Dr. Everitt Amber at [phone] After hours and on week-ends call [phone] and ask to speak to the physician on call for Gynecologic Oncology   After Your Surgery  The information in this section will tell you what to expect after your surgery, both during your stay and after you leave. You will learn how to safely recover from your surgery. Write down any questions you have and be sure to ask your doctor or nurse. What to Expect When you wake up after your surgery, you will be in the [facility] (P[facility]U) or your recovery room. A nurse will be monitoring your body temperature, blood pressure, pulse, and oxygen levels. You may have a urinary catheter in your bladder to help monitor the amount of urine you are making. It should come out before you go home. You will also have compression boots on your lower legs to help your circulation. Your pain medication will be given through an IV line or in tablet form. If you are having pain, tell your nurse. Your nurse will tell you how to recover from your surgery. Below are examples of ways you can help yourself recover safely.  You will be encouraged to walk with the help of your nurse or physical therapist. We will give you medication to relieve pain. Walking helps reduce the risk for blood  clots and pneumonia. It also helps to stimulate your bowels so they begin working again.  Use your incentive spirometer. This will help your lungs expand, which prevents pneumonia. For more information, read How to Use Your Incentive Spirometer. Commonly Asked Questions Will I have pain after surgery? Yes, you will have some pain after your surgery, especially in the first few days. Your doctor and nurse will ask you about your pain often. You will be given medication to manage your pain as needed. If your pain is not relieved, please tell your doctor or nurse. It is important to control your pain so you can cough, breathe deeply, use your  incentive spirometer, and get out of bed and walk. Will I be able to eat? Yes, you will be able to eat a regular diet or eat as tolerated. You should start with foods that are soft and easy to digest such as apple sauce and chicken noodle soup. Eat small meals frequently, and then advance to regular foods. If you experience bloating, gas, or cramps, limit high-fiber foods, including whole grain breads and cereal, nuts, seeds, salads, fresh fruit, broccoli, cabbage, and cauliflower. Will I have pain when I am home? The length of time each person has pain or discomfort varies. You may still have some pain when you go home and will probably be taking pain medication. Follow the guidelines below.  Take your medications as directed and as needed.  Call your doctor if the medication prescribed for you doesn't relieve your pain.  Don't drive or drink alcohol while you're taking prescription pain medication.  As your incision heals, you will have less pain and need less pain medication. A mild pain reliever such as acetaminophen (Tylenol) or ibuprofen (Advil) will relieve aches and discomfort. However, large quantities of acetaminophen may be harmful to your liver. Don't take more acetaminophen than the amount directed on the bottle or as instructed by your doctor or nurse.  Pain medication should help you as you resume your normal activities. Take enough medication to do your exercises comfortably. Pain medication is most effective 30 to 45 minutes after taking it.  Keep track of when you take your pain medication. Taking it when your pain first begins is more effective than waiting for the pain to get worse. Pain medication may cause constipation (having fewer bowel movements than what is normal for you). How can I prevent constipation?  Go to the bathroom at the same time every day. Your body will get used to going at that time.  If you feel the urge to go, don't put it off. Try to use the  bathroom 5 to 15 minutes after meals.  After breakfast is a good time to move your bowels. The reflexes in your colon are strongest at this time.  Exercise, if you can. Walking is an excellent form of exercise.  Drink 8 (8-ounce) glasses (2 liters) of liquids daily, if you can. Drink water, juices, soups, ice cream shakes, and other drinks that don't have caffeine. Drinks with caffeine, such as coffee and soda, pull fluid out of the body.  Slowly increase the fiber in your diet to 25 to 35 grams per day. Fruits, vegetables, whole grains, and cereals contain fiber. If you have an ostomy or have had recent bowel surgery, check with your doctor or nurse before making any changes in your diet.  Both over-the-counter and prescription medications are available to treat constipation. Start with 1 of the following over-the-counter medications first: o  Docusate sodium (Colace) 100 mg. Take ___1__ capsules _2____ times a day. This is a stool softener that causes few side effects. Don't take it with mineral oil. o Polyethylene glycol (MiraLAX) 17 grams daily. o Senna (Senokot) 2 tablets at bedtime. This is a stimulant laxative, which can cause cramping.  If you haven't had a bowel movement in 2 days, call your doctor or nurse. Can I shower? Yes, you should shower 24 hours after your surgery. Be sure to shower every day. Taking a warm shower is relaxing and can help decrease muscle aches. Use soap when you shower and gently wash your incision. Pat the areas dry with a towel after showering, and leave your incision uncovered (unless there is drainage). Call your doctor if you see any redness or drainage from your incision. Don't take tub baths until you discuss it with your doctor at the first appointment after your surgery. How do I care for my incisions? You will have several small incisions on your abdomen. The incisions are closed with Steri-Strips or Dermabond. You may also have square white  dressings on your incisions ([place]). You can remove these in the shower 24 hours after your surgery. You should clean your incisions with soap and water. If you go home with Steri-Strips on your incision, they will loosen and may fall off by themselves. If they haven't fallen off within 10 days, you can remove them. If you go home with Dermabond over your sutures (stitches), it will also loosen and peel off. What are the most common symptoms after a hysterectomy? It's common for you to have some vaginal spotting or light bleeding. You should monitor this with a pad or a panty liner. If you have having heavy bleeding (bleeding through a pad or liner every 1 to 2 hours), call your doctor right away. It's also common to have some discomfort after surgery from the air that was pumped into your abdomen during surgery. To help with this, walk, drink plenty of liquids and make sure to take the stool softeners you received. When is it safe for me to drive? You may resume driving 2 weeks after surgery, as long as you are not taking pain medication that may make you drowsy. When can I resume sexual activity? Do not place anything in your vagina or have vaginal intercourse for 8 weeks after your surgery. Some people will need to wait longer than 8 weeks, so speak with your doctor before resuming sexual intercourse. Will I be able to travel? Yes, you can travel. If you are traveling by plane within a few weeks after your surgery, make sure you get up and walk every hour. Be sure to stretch your legs, drink plenty of liquids, and keep your feet elevated when possible. Will I need any supplies? Most people do not need any supplies after the surgery. In the rare case that you do need supplies, such as tubes or drains, your nurse will order them for you. When can I return to work? The time it takes to return to work depends on the type of work you do, the type of surgery you had, and how fast your body heals.  Most people can return to work about 2 to 4 weeks after the surgery. What exercises can I do? Exercise will help you gain strength and feel better. Walking and stair climbing are excellent forms of exercise. Gradually increase the distance you walk. Climb stairs slowly, resting or stopping as needed. Ask your doctor or  nurse before starting more strenuous exercises. When can I lift heavy objects? Most people should not lift anything heavier than 10 pounds (4.5 kilograms) for at least 4 weeks after surgery. Speak with your doctor about when you can do heavy lifting. How can I cope with my feelings? After surgery for a serious illness, you may have new and upsetting feelings. Many people say they felt weepy, sad, worried, nervous, irritable, and angry at one time or another. You may find that you can't control some of these feelings. If this happens, it's a good idea to seek emotional support. The first step in coping is to talk about how you feel. Family and friends can help. Your nurse, doctor, and social worker can reassure, support, and guide you. It's always a good idea to let these professionals know how you, your family, and your friends are feeling emotionally. Many resources are available to patients and their families. Whether you're in the hospital or at home, the nurses, doctors, and social workers are here to help you and your family and friends handle the emotional aspects of your illness. When is my first appointment after surgery? Your first appointment after surgery will be 2 to 4 weeks after surgery. Your nurse will give you instructions on how to make this appointment, including the phone number to call. What if I have other questions? If you have any questions or concerns, please talk with your doctor or nurse. You can reach them Monday through Friday from 9:00 am to 5:00 pm. After 5:00 pm, during the weekend, and on holidays, call [phone] and ask for the doctor on call for your  doctor.   Have a temperature of 101 F (38.3 C) or higher  Have pain that does not get better with pain medication  Have redness, drainage, or swelling from your incisions

## 2018-11-22 ENCOUNTER — Encounter (HOSPITAL_COMMUNITY): Payer: Self-pay | Admitting: Gynecologic Oncology

## 2018-11-22 ENCOUNTER — Telehealth: Payer: Self-pay

## 2018-11-22 NOTE — Telephone Encounter (Signed)
Calling patient to see how she is doing after her surgery 11-21-18.  Not able to leave a message as her mail box is full.

## 2018-11-22 NOTE — Telephone Encounter (Signed)
[name] states that her lower abdomen and vagina is very sore. Pin is a 7/10. Pain is constant. She is using 1000 mg of Tylenol ~4 hours.She took Oxycodone x 2 yesterday with no effect. She is eating and drinking water and Gatorade. Afebrile. No BM. No gas. Burning with urination.  She has an abrasion on the introitus which may be burning with urine per [name], NP. Suggested that she use a squeeze bottle with urination to dilute the urine. If the burning persists, she needs to call the office Thursday 11-23-18  To schedule a urinalysis to see if she has a UTI from the foley catheter during surgery. Melissa also stated that she can take 2 Oxycodone every 4 hours prn pain.  Told her to only use 1000 mg of tylenol every 6 hours as not to exceed 4000 mg in a 24 hr period to prevent liver problems. She can also use ice packs on her lover abdomen and perineal area. Pt verbalized understanding.

## 2018-11-23 NOTE — Telephone Encounter (Signed)
Holly Graham called stating that she feels much better today.  She is sore and tired.  The ice and diluting her urine was very helpful in decreasing her pain. She is not burning with urination anymore. She took 1 Oxycodone this am and has only needed the tylenol all day. Pt with 6 tablets of Oxycodone left.  She feels that this will last her through the weekend at least. She will call if she has any questions or concerns.

## 2018-11-24 ENCOUNTER — Telehealth: Payer: Self-pay

## 2018-11-24 NOTE — Telephone Encounter (Signed)
Told [name] that the final  Pathology showed no cancer per [name], NP.

## 2018-11-25 LAB — BPAM RBC
Blood Product Expiration Date: 202009052359
Blood Product Expiration Date: 202009082359
Unit Type and Rh: 7300
Unit Type and Rh: 7300

## 2018-11-25 LAB — TYPE AND SCREEN
ABO/RH(D): B POS
Antibody Screen: NEGATIVE
Unit division: 0
Unit division: 0

## 2018-11-27 ENCOUNTER — Telehealth: Payer: Self-pay

## 2018-11-27 ENCOUNTER — Other Ambulatory Visit: Payer: Self-pay | Admitting: Hematology and Oncology

## 2018-11-27 DIAGNOSIS — I2699 Other pulmonary embolism without acute cor pulmonale: Secondary | ICD-10-CM

## 2018-11-27 DIAGNOSIS — I871 Compression of vein: Secondary | ICD-10-CM

## 2018-11-27 NOTE — Telephone Encounter (Signed)
Lab appt made.  Spoke with pt by phone and gave below msg.  Pt aware of appt date and time and to anticipate a phone call to schedule her CT

## 2018-11-27 NOTE — Telephone Encounter (Signed)
-----   Message from Heath Lark, MD sent at 11/27/2018  8:22 AM EDT ----- Regarding: follow-up appt She had recent surgery Can you add lab appt before she sees Dr. Denman George same day? Also, please let her know I plan to order repeat CT end of September and see her back after to determine if we can stop her blood thinner If she agrees with the plan let me know and I will put in orders

## 2018-11-29 ENCOUNTER — Telehealth: Payer: Self-pay | Admitting: Hematology and Oncology

## 2018-11-29 NOTE — Telephone Encounter (Signed)
I talk with patient regarding 10/1

## 2018-12-13 ENCOUNTER — Other Ambulatory Visit: Payer: Self-pay

## 2018-12-13 ENCOUNTER — Encounter: Payer: Self-pay | Admitting: Gynecologic Oncology

## 2018-12-13 ENCOUNTER — Inpatient Hospital Stay: Payer: Medicaid Other | Attending: Hematology and Oncology | Admitting: Gynecologic Oncology

## 2018-12-13 ENCOUNTER — Inpatient Hospital Stay: Payer: Medicaid Other

## 2018-12-13 VITALS — BP 150/107 | HR 79 | Temp 98.3°F | Resp 17 | Ht 63.0 in | Wt 138.4 lb

## 2018-12-13 DIAGNOSIS — Z9079 Acquired absence of other genital organ(s): Secondary | ICD-10-CM | POA: Diagnosis not present

## 2018-12-13 DIAGNOSIS — Z9089 Acquired absence of other organs: Secondary | ICD-10-CM | POA: Diagnosis not present

## 2018-12-13 DIAGNOSIS — Z90710 Acquired absence of both cervix and uterus: Secondary | ICD-10-CM | POA: Diagnosis not present

## 2018-12-13 DIAGNOSIS — Z7901 Long term (current) use of anticoagulants: Secondary | ICD-10-CM | POA: Diagnosis not present

## 2018-12-13 DIAGNOSIS — N939 Abnormal uterine and vaginal bleeding, unspecified: Secondary | ICD-10-CM

## 2018-12-13 DIAGNOSIS — Z86711 Personal history of pulmonary embolism: Secondary | ICD-10-CM | POA: Diagnosis not present

## 2018-12-13 DIAGNOSIS — D250 Submucous leiomyoma of uterus: Secondary | ICD-10-CM | POA: Insufficient documentation

## 2018-12-13 DIAGNOSIS — I2699 Other pulmonary embolism without acute cor pulmonale: Secondary | ICD-10-CM

## 2018-12-13 DIAGNOSIS — Z79899 Other long term (current) drug therapy: Secondary | ICD-10-CM | POA: Insufficient documentation

## 2018-12-13 DIAGNOSIS — Z79818 Long term (current) use of other agents affecting estrogen receptors and estrogen levels: Secondary | ICD-10-CM | POA: Insufficient documentation

## 2018-12-13 DIAGNOSIS — N760 Acute vaginitis: Secondary | ICD-10-CM

## 2018-12-13 DIAGNOSIS — D509 Iron deficiency anemia, unspecified: Secondary | ICD-10-CM | POA: Diagnosis not present

## 2018-12-13 DIAGNOSIS — N858 Other specified noninflammatory disorders of uterus: Secondary | ICD-10-CM

## 2018-12-13 DIAGNOSIS — D251 Intramural leiomyoma of uterus: Secondary | ICD-10-CM | POA: Diagnosis not present

## 2018-12-13 LAB — COMPREHENSIVE METABOLIC PANEL
ALT: 24 U/L (ref 0–44)
AST: 36 U/L (ref 15–41)
Albumin: 4.4 g/dL (ref 3.5–5.0)
Alkaline Phosphatase: 115 U/L (ref 38–126)
Anion gap: 11 (ref 5–15)
BUN: 9 mg/dL (ref 6–20)
CO2: 32 mmol/L (ref 22–32)
Calcium: 9.8 mg/dL (ref 8.9–10.3)
Chloride: 97 mmol/L — ABNORMAL LOW (ref 98–111)
Creatinine, Ser: 0.78 mg/dL (ref 0.44–1.00)
GFR calc Af Amer: >60 mL/min (ref >60)
GFR calc non Af Amer: >60 mL/min (ref >60)
Glucose, Bld: 105 mg/dL — ABNORMAL HIGH (ref 70–99)
Potassium: 3.6 mmol/L (ref 3.5–5.1)
Sodium: 140 mmol/L (ref 135–145)
Total Bilirubin: 0.5 mg/dL (ref 0.3–1.2)
Total Protein: 7.5 g/dL (ref 6.5–8.1)

## 2018-12-13 LAB — CBC WITH DIFFERENTIAL/PLATELET
Abs Immature Granulocytes: 0.03 10*3/uL (ref 0.00–0.07)
Basophils Absolute: 0 10*3/uL (ref 0.0–0.1)
Basophils Relative: 0 %
Eosinophils Absolute: 0.3 10*3/uL (ref 0.0–0.5)
Eosinophils Relative: 4 %
HCT: 33.7 % — ABNORMAL LOW (ref 36.0–46.0)
Hemoglobin: 11.4 g/dL — ABNORMAL LOW (ref 12.0–15.0)
Immature Granulocytes: 0 %
Lymphocytes Relative: 36 %
Lymphs Abs: 2.6 10*3/uL (ref 0.7–4.0)
MCH: 36.2 pg — ABNORMAL HIGH (ref 26.0–34.0)
MCHC: 33.8 g/dL (ref 30.0–36.0)
MCV: 107 fL — ABNORMAL HIGH (ref 80.0–100.0)
Monocytes Absolute: 0.5 10*3/uL (ref 0.1–1.0)
Monocytes Relative: 6 %
Neutro Abs: 4 10*3/uL (ref 1.7–7.7)
Neutrophils Relative %: 54 %
Platelets: 362 10*3/uL (ref 150–400)
RBC: 3.15 MIL/uL — ABNORMAL LOW (ref 3.87–5.11)
RDW: 13.3 % (ref 11.5–15.5)
WBC: 7.4 10*3/uL (ref 4.0–10.5)
nRBC: 0 % (ref 0.0–0.2)

## 2018-12-13 LAB — SAMPLE TO BLOOD BANK

## 2018-12-13 MED ORDER — METRONIDAZOLE 500 MG PO TABS: 500.0000 mg | ORAL_TABLET | Freq: Three times a day (TID) | ORAL | 0 refills | Status: DC

## 2018-12-13 MED ORDER — CIPROFLOXACIN HCL 250 MG PO TABS: 250.0000 mg | ORAL_TABLET | Freq: Two times a day (BID) | ORAL | 0 refills | Status: DC

## 2018-12-13 NOTE — Progress Notes (Signed)
Consult Note: Gyn-Onc  Consult was requested by Dr. Alvy Bimler for the evaluation of Anyeli Hoggard 38 y.o. female  CC:  Chief Complaint  Patient presents with  . endometrial cancer    Assessment/Plan:  Ms. Myrna Pebley  is a 38 y.o.  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   She has signs concerning for vaginal cuff cellulitis.  She is afebrile and stable and therefore meets criteria for outpatient management.  We will try 2 weeks of ciprofloxacin and Flagyl.  She was counseled that if her symptoms become worse or she develops fever she should notify us and we will evaluate with CT scan of the abdomen and pelvis.  Otherwise she has resumed oral anticoagulant therapy.  Symptoms of Lupron menopause have resolved.  HPI: Ms Satoya Kathrine Cords is a 38 year old P3 who is seen in consultation at the request of Dr Alvy Bimler for a history of symptomatic uterine fibroids and a recent DVT/PE.  The patient experienced left lower extremity edema and then pulmonary embolism in February 2020.  She was admitted to the hospital and imaging confirmed these clot locations.  The etiology of her clot was felt to be secondary to obstruction from a bulky fibroid uterus which on CT imaging was demonstrated.  She was treated with anticoagulant therapy, and stent placement in the left external iliac vein.  She also had thrombectomy of the left common iliac vein and femoral vein.  Echocardiogram confirmed right ventricular strain but did not show signs of permanent heart damage.  The ejection fraction was preserved.  She was continued on Xarelto anticoagulant therapy after initially being treated with heparin infusion.  However she began experiencing menorrhagia while on anticoagulant therapy.  She had been treated with Megace since her hospitalization.  Endometrial biopsy in March 2020 revealed scant benign  endometrial and endocervical mucosa.  She received iron infusions intravenously for her symptomatic anemia and in April 2020 she received a Lupron injection.  Following the Depo-Lupron injection she became amenorrheic.  She also felt less bulk symptoms from her fibroid uterus in the pelvis.  She felt less anemic.  A transvaginal ultrasound scan that had been performed in February 2020 confirmed a uterus measuring 13.4 x 8.5 x 10.1 cm with a large intramural myometrial mass consistent with a fibroid measuring 9.5 x 11.2 x 8.2 cm.  The right ovary was grossly normal with the exception of a small simple appearing cyst measuring 2.7 cm and the left ovary was normal at 3.6 x 4.3 cm.  MRI of the pelvis performed on [place] 03/18/2019 confirmed similar findings including a uterus containing multiple intramural and submucosal fibroids with total dimensions of 16 x 10.2 x 11.5 cm.  The patient is otherwise a healthy woman.  She does give a history of a strong family history of venous thromboembolic events, though no defined coagulopathy that is identified.  She has had 3 prior vaginal deliveries.  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  She lives with her children who are teenage.  Her any abdominal surgery was a tubal ligation after childbirth.  Interval Hx:  She received a preoperative MRI which showed improvement in the size and bulk of her fibroids.  On November 21, 2018 she was taken to the operating room for robotic assisted total hysterectomy for uterus greater than 250 g with bilateral salpingectomy.  Intraoperative findings were significant  for a uterus measuring 14 cm with bulky intramural fibroids, normal-appearing ovaries and tubes bilaterally.  Due to the large size of the uterus a cruciate incision on the vaginal cuff was made for specimen delivery vaginally.  Surgery was uncomplicated and she was discharged home the same day.  Since surgery she has had reasonable progress.  She has  resumed oral anticoagulant therapy and is no longer on Lovenox.  She has been noting increasing suprapubic cramping discomfort and significant fatigue.  She reports drainage of bloody mucoid discharge from the vagina.    Current Meds:  Outpatient Encounter Medications as of 12/13/2018  Medication Sig  . acetaminophen (TYLENOL) 500 MG tablet Take 1,000 mg by mouth every 6 (six) hours as needed for mild pain.  . Carboxymethylcellul-Glycerin (LUBRICATING EYE DROPS OP) Place 1 drop into both eyes daily as needed (dryness).  . ciprofloxacin (CIPRO) 250 MG tablet Take 1 tablet (250 mg total) by mouth 2 (two) times daily.  Marland Kitchen enoxaparin (LOVENOX) 60 MG/0.6ML injection Inject 0.6 mLs (60 mg total) into the skin every 12 (twelve) hours for 28 doses. 7 days preop and 7 days post op  . metroNIDAZOLE (FLAGYL) 500 MG tablet Take 1 tablet (500 mg total) by mouth 3 (three) times daily.  . ondansetron (ZOFRAN) 4 MG tablet Take 1 tablet (4 mg total) by mouth every 8 (eight) hours as needed for nausea or vomiting.  Marland Kitchen oxyCODONE (OXY IR/ROXICODONE) 5 MG immediate release tablet Take 1 tablet (5 mg total) by mouth every 4 (four) hours as needed for severe pain. For AFTER surgery, do not take and drive  . rivaroxaban (XARELTO) 20 MG TABS tablet Take 1 tablet (20 mg total) by mouth daily with supper.  . senna-docusate (SENOKOT-S) 8.6-50 MG tablet Take 2 tablets by mouth at bedtime. For AFTER surgery, do not take if having diarrhea   No facility-administered encounter medications on file as of 12/13/2018.     Allergy: No Known Allergies  Social Hx:   Social History   Socioeconomic History  . Marital status: Single    Spouse name: Not on file  . Number of children: Not on file  . Years of education: Not on file  . Highest education level: Not on file  Occupational History  . Not on file  Social Needs  . Financial resource strain: Not on file  . Food insecurity    Worry: Never true    Inability: Never true   . Transportation needs    Medical: No    Non-medical: No  Tobacco Use  . Smoking status: Never Smoker  . Smokeless tobacco: Never Used  Substance and Sexual Activity  . Alcohol use: Yes  . Drug use: Never  . Sexual activity: Yes    Birth control/protection: Injection  Lifestyle  . Physical activity    Days per week: Not on file    Minutes per session: Not on file  . Stress: Not on file  Relationships  . Social Herbalist on phone: Not on file    Gets together: Not on file    Attends religious service: Not on file    Active member of club or organization: Not on file    Attends meetings of clubs or organizations: Not on file    Relationship status: Not on file  . Intimate partner violence    Fear of current or ex partner: Not on file    Emotionally abused: Not on file    Physically abused:  Not on file    Forced sexual activity: Not on file  Other Topics Concern  . Not on file  Social History Narrative  . Not on file    Past Surgical Hx:  Past Surgical History:  Procedure Laterality Date  . DILATION AND CURETTAGE OF UTERUS    . INTRAVASCULAR ULTRASOUND/IVUS N/A 05/15/2018   Procedure: INTRAVASCULAR ULTRASOUND/IVUS;  Surgeon: Waynetta Sandy, MD;  Location: [facility];  Service: Cardiovascular;  Laterality: N/A;  . PERIPHERAL VASCULAR THROMBECTOMY N/A 05/15/2018   Procedure: PERIPHERAL VASCULAR THROMBECTOMY;  Surgeon: Waynetta Sandy, MD;  Location: [facility];  Service: Cardiovascular;  Laterality: N/A;  . ROBOTIC ASSISTED TOTAL HYSTERECTOMY WITH BILATERAL SALPINGO OOPHERECTOMY N/A 11/21/2018   Procedure: XI ROBOTIC ASSISTED TOTAL HYSTERECTOMY WITH BILATERAL SALPINGECTOMY GREATER THAN 250 GRAMS;  Surgeon: Everitt Amber, MD;  Location: [facility];  Service: Gynecology;  Laterality: N/A;  . TUBAL LIGATION      Past Medical Hx:  Past Medical History:  Diagnosis Date  . DVT (deep venous thrombosis) ([place])   . Fibroids 05/11/2018  .  History of blood transfusion   . Iron deficiency anemia   . Pulmonary emboli ([facility])    Bilateral    Past Gynecological History: See HPI No LMP recorded. Patient has had an injection.  Family Hx:  Family History  Problem Relation Age of Onset  . Pulmonary embolism Brother   . Diabetes Maternal Grandmother     Review of Systems:  Constitutional  Feels well,    ENT Normal appearing ears and nares bilaterally Skin/Breast  No rash, sores, jaundice, itching, dryness Cardiovascular  No chest pain, shortness of breath, or edema  Pulmonary  No cough or wheeze.  Gastro Intestinal  No nausea, vomitting, or diarrhoea. No bright red blood per rectum, no abdominal pain, change in bowel movement, or constipation.  Genito Urinary  No frequency, urgency, dysuria, amenorrhea Musculo Skeletal  No myalgia, arthralgia, joint swelling or pain  Neurologic  No weakness, numbness, change in gait,  Psychology  No depression, anxiety, insomnia.   Vitals:  Blood pressure (!) 150/107, pulse 79, temperature 98.3 F (36.8 C), temperature source Oral, resp. rate 17, height 5\' 3"  (1.6 m), weight 138 lb 6.4 oz (62.8 kg), SpO2 100 %.  Physical Exam: WD in NAD Neck  Supple NROM, without any enlargements.  Lymph Node Survey No cervical supraclavicular or inguinal adenopathy Cardiovascular  Pulse normal rate, regularity and rhythm. S1 and S2 normal.  Lungs  Clear to auscultation bilateraly, without wheezes/crackles/rhonchi. Good air movement.  Skin  No rash/lesions/breakdown  Psychiatry  Alert and oriented to person, place, and time  Abdomen  Normoactive bowel sounds, abdomen soft, non-tender and obese without evidence of hernia. Mass (mobile) appreciated in lower right abdomen Back No CVA tenderness Genito Urinary  Vaginal cuff shows some erythema and clear/milky drainage. It is very tender to palpation but no palpable collection.  Rectal  deferred Extremities  No bilateral cyanosis,  clubbing or edema.   Thereasa Solo, MD  12/13/2018, 4:26 PM

## 2018-12-13 NOTE — Patient Instructions (Signed)
Dr Denman George has prescribed you 2 weeks of 2 antibiotics to treat a low level infection at the top of the vagina. Please avoid from intercourse for 5 weeks.  Please notify Dr Serita Grit office if your symptoms do not improve with antibiotics or become worse or you develop fevers >100.4.

## 2018-12-14 LAB — IRON AND TIBC
Iron: 69 ug/dL (ref 41–142)
Saturation Ratios: 26 % (ref 21–57)
TIBC: 261 ug/dL (ref 236–444)
UIBC: 192 ug/dL (ref 120–384)

## 2018-12-14 LAB — FERRITIN: Ferritin: 403 ng/mL — ABNORMAL HIGH (ref 11–307)

## 2018-12-25 ENCOUNTER — Other Ambulatory Visit: Payer: Self-pay | Admitting: Hematology and Oncology

## 2018-12-25 DIAGNOSIS — I2699 Other pulmonary embolism without acute cor pulmonale: Secondary | ICD-10-CM

## 2018-12-25 DIAGNOSIS — I871 Compression of vein: Secondary | ICD-10-CM

## 2018-12-27 ENCOUNTER — Ambulatory Visit (HOSPITAL_COMMUNITY): Payer: Medicaid Other

## 2018-12-27 ENCOUNTER — Ambulatory Visit (HOSPITAL_COMMUNITY): Admission: RE | Admit: 2018-12-27 | Payer: Medicaid Other | Source: Ambulatory Visit

## 2018-12-27 ENCOUNTER — Telehealth: Payer: Self-pay | Admitting: *Deleted

## 2018-12-27 NOTE — Telephone Encounter (Signed)
RN noted patient did not show for her CT Scan today. Telephone call to patient- patient missed appt today due to car trouble. She was without transportation. Central Scheduling number provided to patient to call and reschedule. Patient will not be coming to appt on 10/1 since she did not get scan today.

## 2018-12-28 ENCOUNTER — Inpatient Hospital Stay: Payer: Medicaid Other | Admitting: Hematology and Oncology

## 2019-01-03 ENCOUNTER — Telehealth: Payer: Self-pay

## 2019-01-03 NOTE — Telephone Encounter (Signed)
Called regarding CT scan not being scheduled. She is going to call today and schedule.

## 2019-01-04 ENCOUNTER — Telehealth: Payer: Self-pay | Admitting: Hematology and Oncology

## 2019-01-04 NOTE — Telephone Encounter (Signed)
Scheduled appt per 10/8 sch message - unable to reach pt - left message with appt date and time

## 2019-01-05 ENCOUNTER — Encounter (HOSPITAL_COMMUNITY): Payer: Self-pay

## 2019-01-05 ENCOUNTER — Ambulatory Visit (HOSPITAL_COMMUNITY)
Admission: RE | Admit: 2019-01-05 | Discharge: 2019-01-05 | Disposition: A | Discharge status: Home / Self Care | Payer: Medicaid Other | Source: Ambulatory Visit | Attending: Hematology and Oncology | Admitting: Hematology and Oncology

## 2019-01-05 ENCOUNTER — Other Ambulatory Visit: Payer: Self-pay

## 2019-01-05 DIAGNOSIS — I2699 Other pulmonary embolism without acute cor pulmonale: Secondary | ICD-10-CM

## 2019-01-05 DIAGNOSIS — I871 Compression of vein: Secondary | ICD-10-CM | POA: Diagnosis present

## 2019-01-05 MED ORDER — SODIUM CHLORIDE (PF) 0.9 % IJ SOLN
INTRAMUSCULAR | Status: AC
  Filled 2019-01-05: qty 50

## 2019-01-05 MED ORDER — IOHEXOL 350 MG/ML SOLN
100.0000 mL | Freq: Once | INTRAVENOUS | Status: AC | PRN
  Administered 2019-01-05: 16:00:00 100 mL via INTRAVENOUS

## 2019-01-09 ENCOUNTER — Inpatient Hospital Stay: Payer: Medicaid Other | Attending: Hematology and Oncology | Admitting: Hematology and Oncology

## 2019-01-09 ENCOUNTER — Encounter: Payer: Self-pay | Admitting: Hematology and Oncology

## 2019-01-09 ENCOUNTER — Other Ambulatory Visit: Payer: Self-pay

## 2019-01-09 DIAGNOSIS — I2699 Other pulmonary embolism without acute cor pulmonale: Secondary | ICD-10-CM | POA: Insufficient documentation

## 2019-01-09 DIAGNOSIS — R5383 Other fatigue: Secondary | ICD-10-CM | POA: Insufficient documentation

## 2019-01-09 DIAGNOSIS — Z7901 Long term (current) use of anticoagulants: Secondary | ICD-10-CM | POA: Insufficient documentation

## 2019-01-09 DIAGNOSIS — M7989 Other specified soft tissue disorders: Secondary | ICD-10-CM | POA: Diagnosis not present

## 2019-01-09 DIAGNOSIS — I871 Compression of vein: Secondary | ICD-10-CM | POA: Diagnosis not present

## 2019-01-09 NOTE — Assessment & Plan Note (Signed)
I have reviewed CT imaging Her bilateral PE had resolved From the standpoint, her anticoagulation therapy could be discontinued However, due to presence of a stent/graft, I recommend follow-up appointment with vascular center first before discontinuation of anticoagulation therapy

## 2019-01-09 NOTE — Progress Notes (Signed)
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 Carlis Graham briefly over the telephone and he recommend the patient to return back to vascular center for another venous imaging before deciding whether the patient to stop anticoagulation therapy I recommended the patient to call his office for further follow-up In the meantime, she will continue anticoagulation therapy   No orders of the defined types were placed in this encounter.   INTERVAL HISTORY: Holly Graham 38 y.o. female returns for further follow-up She is doing well Complains of mild fatigue Denies leg pain, chest pain or shortness of breath The patient denies any recent signs or symptoms of bleeding such as spontaneous epistaxis, hematuria or hematochezia.  SUMMARY OF HEMATOLOGIC HISTORY:  Holly Graham 38 y.o. female is here because of recent diagnosis of DVT and PE  She initially presented to the emergency department at the end of January for symptom of chest pain but subsequently left without further evaluation She represented again to the emergency department on May 08, 2018 and was admitted and hospitalized until 05/18/2018. Her presentation was associated with chest pain, shortness of breath and leg swelling According to her, she has been having chest pain and shortness of breath since Christmas.  Her leg swelling was new since after the new year  She underwent multiple  investigation including CT angiogram on 05/08/2018 which revealed bilateral PE with acute right ventricular strain. Urgent echocardiogram did not reveal any signs of heart damage.  Ejection fraction is preserved.  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 She subsequently underwent ultrasound pelvis as it was thought that her uterine fibroid was compressing on the left iliac vein.  Ultrasound pelvis revealed endometrial mass She subsequently underwent MRI of the pelvis which showed large uterine mass/fibroid.  She was evaluated by gynecologist and was placed on Megace. The appearance on the MRI suggest Holly Graham Thurner syndrome.  Vascular surgery and interventional radiologist were consulted.  On 05/15/2018, she underwent the following procedures: Procedure Performed: 1.  Ultrasound-guided cannulation left popliteal vein 2.  Intravascular ultrasound left femoral vein, left common femoral vein, left common external iliac veins and IVC 3.  Stent of left common external iliac veins with 14 x 120 mm Vici 4.  Central venogram 5.  Mechanical thrombectomy of left common, external iliac veins and common femoral vein with Inari Clottriever  She was anticoagulated initially with heparin and then transition to oral anticoagulation therapy. During her hospital stay, she also received a unit of blood transfusion. With her last menstrual.,  She has been bleeding since April 30, 2018.  Recently, she passed a huge clot with the size of a cantaloupe.  She show me pictures that she took. She [place] at the age of 5, her usual cycle is 3 to 4 days, for cycle 28 days, usually nonheavy Over the last 6 months, she is bleeding heavier than usual, 5 to 7 days of a cycle of every 28 days She has been placed on Megace since hospitalization She has significant lower abdominal cramp She  complaining of significant fatigue despite recent blood transfusion She also complained of anorexia and occasional  postprandial nausea and vomiting. She also new onset of constipation.  She takes prune juice because without that, her bowel movement will be every 3 to 4 days She still have intermittent chest pain and shortness of breath but her leg swelling has mildly improved  She denies recent history of trauma, long distance travel, dehydration, recent surgery, smoking or prolonged immobilization. She had no prior history or diagnosis of cancer.  Her last Pap smear is more than 12 years ago.  She has not have any examination since her last child was born  The patient had been exposed to birth control pills and surgeries and never had thrombotic events. The patient had been pregnant before and denies history of peripartum thromboembolic event or history of recurrent miscarriages. There is no family history of blood clots or miscarriages. From June 22, 2018, she underwent endometrial biopsy Biopsy [id number] Endometrium, biopsy - SCANT INACTIVE ENDOMETRIUM AND SCANT ENDOCERVICAL MUCOSA. - ABUNDANT BLOOD. - NO ATYPIA OR MALIGNANCY. She was placed on Xarelto On 08/04/2018, she received one dose of Lupron plus Iv Iron  She had repeat MRI in July which show shrinkage of the uterine mass She underwent complete hysterectomy in August which showed benign pathology Repeat CT imaging in October showed resolution of PE  I have reviewed the past medical history, past surgical history, social history and family history with the patient and they are unchanged from previous note.  ALLERGIES:  has No Known Allergies.  MEDICATIONS:  Current Outpatient Medications  Medication Sig Dispense Refill  . acetaminophen (TYLENOL) 500 MG tablet Take 1,000 mg by mouth every 6 (six) hours as needed for mild pain.    . Carboxymethylcellul-Glycerin (LUBRICATING EYE DROPS OP) Place 1 drop into both eyes daily as needed (dryness).    . ciprofloxacin (CIPRO) 250 MG tablet Take 1 tablet (250 mg total) by mouth 2 (two) times daily.  38 tablet 0  . enoxaparin (LOVENOX) 60 MG/0.6ML injection Inject 0.6 mLs (60 mg total) into the skin every 12 (twelve) hours for 28 doses. 7 days preop and 7 days post op 16.8 mL 0  . metroNIDAZOLE (FLAGYL) 500 MG tablet Take 1 tablet (500 mg total) by mouth 3 (three) times daily. 42 tablet 0  . ondansetron (ZOFRAN) 4 MG tablet Take 1 tablet (4 mg total) by mouth every 8 (eight) hours as needed for nausea or vomiting. 20 tablet 0  . oxyCODONE (OXY IR/ROXICODONE) 5 MG immediate release tablet Take 1 tablet (5 mg total) by mouth every 4 (four) hours as needed for severe pain. For AFTER surgery, do not take and drive 15 tablet 0  . rivaroxaban (XARELTO) 20 MG TABS tablet Take 1 tablet (20 mg total) by mouth daily with supper. 30 tablet 9  . senna-docusate (SENOKOT-S) 8.6-50 MG tablet Take 2 tablets by mouth at bedtime. For AFTER surgery, do not take if having diarrhea 30 tablet 1   No current facility-administered medications for this visit.      REVIEW OF SYSTEMS:   Constitutional: Denies fevers, chills or night sweats Eyes: Denies blurriness of vision Ears, nose, mouth, throat, and face: Denies mucositis or sore throat Respiratory: Denies cough, dyspnea or wheezes Cardiovascular: Denies palpitation, chest discomfort or lower extremity swelling Gastrointestinal:  Denies nausea, heartburn or change in bowel habits Skin: Denies abnormal skin rashes Lymphatics: Denies new lymphadenopathy or easy bruising Neurological:Denies numbness, tingling or new weaknesses Behavioral/Psych: Mood is  stable, no new changes  All other systems were reviewed with the patient and are negative.  PHYSICAL EXAMINATION: ECOG PERFORMANCE STATUS: 1 - Symptomatic but completely ambulatory  Vitals:   01/09/19 1408  BP: (!) 145/100  Pulse: 82  Resp: 18  Temp: 98.7 F (37.1 C)  SpO2: 100%   Filed Weights   01/09/19 1408  Weight: 138 lb 14.4 oz (63 kg)    GENERAL:alert, no distress and comfortable NEURO:  alert & oriented x 3 with fluent speech, no focal motor/sensory deficits  LABORATORY DATA:  I have reviewed the data as listed     Component Value Date/Time   NA 140 12/13/2018 1448   K 3.6 12/13/2018 1448   CL 97 (L) 12/13/2018 1448   CO2 32 12/13/2018 1448   GLUCOSE 105 (H) 12/13/2018 1448   BUN 9 12/13/2018 1448   CREATININE 0.78 12/13/2018 1448   CALCIUM 9.8 12/13/2018 1448   PROT 7.5 12/13/2018 1448   ALBUMIN 4.4 12/13/2018 1448   AST 36 12/13/2018 1448   ALT 24 12/13/2018 1448   ALKPHOS 115 12/13/2018 1448   BILITOT 0.5 12/13/2018 1448   GFRNONAA >60 12/13/2018 1448   GFRAA >60 12/13/2018 1448    No results found for: SPEP, UPEP  Lab Results  Component Value Date   WBC 7.4 12/13/2018   NEUTROABS 4.0 12/13/2018   HGB 11.4 (L) 12/13/2018   HCT 33.7 (L) 12/13/2018   MCV 107.0 (H) 12/13/2018   PLT 362 12/13/2018      Chemistry      Component Value Date/Time   NA 140 12/13/2018 1448   K 3.6 12/13/2018 1448   CL 97 (L) 12/13/2018 1448   CO2 32 12/13/2018 1448   BUN 9 12/13/2018 1448   CREATININE 0.78 12/13/2018 1448      Component Value Date/Time   CALCIUM 9.8 12/13/2018 1448   ALKPHOS 115 12/13/2018 1448   AST 36 12/13/2018 1448   ALT 24 12/13/2018 1448   BILITOT 0.5 12/13/2018 1448     I reviewed CT imaging with the patient I spent 10 minutes counseling the patient face to face. The total time spent in the appointment was 15 minutes and more than 50% was on counseling.   All questions were answered. The patient knows to call the clinic with any problems, questions or concerns. No barriers to learning was detected.    Heath Lark, MD 10/13/20202:41 PM

## 2019-01-09 NOTE — Assessment & Plan Note (Signed)
The uterine mass reveal fibroid only, which is a benign pathology Technically, the cause of her DVT has resolved Due to presence of the graft, I consulted with Dr. Carlis Abbott briefly over the telephone and he recommend the patient to return back to vascular center for another venous imaging before deciding whether the patient to stop anticoagulation therapy I recommended the patient to call his office for further follow-up In the meantime, she will continue anticoagulation therapy

## 2019-01-12 ENCOUNTER — Encounter: Payer: Self-pay | Admitting: Gynecologic Oncology

## 2019-01-12 ENCOUNTER — Telehealth: Payer: Self-pay

## 2019-01-12 NOTE — Telephone Encounter (Signed)
Told  Holly Graham that the letter to return to work will be up front for her to pick on Monday 01-15-19.

## 2019-01-19 ENCOUNTER — Other Ambulatory Visit: Payer: Self-pay | Admitting: Vascular Surgery

## 2019-01-19 DIAGNOSIS — I871 Compression of vein: Secondary | ICD-10-CM

## 2019-01-19 DIAGNOSIS — R609 Edema, unspecified: Secondary | ICD-10-CM

## 2019-02-05 ENCOUNTER — Other Ambulatory Visit: Payer: Self-pay

## 2019-02-05 ENCOUNTER — Emergency Department (HOSPITAL_COMMUNITY): Payer: No Typology Code available for payment source

## 2019-02-05 ENCOUNTER — Emergency Department (HOSPITAL_COMMUNITY)
Admission: EM | Admit: 2019-02-05 | Discharge: 2019-02-06 | Disposition: A | Discharge status: Home / Self Care | Payer: No Typology Code available for payment source | Attending: Emergency Medicine | Admitting: Emergency Medicine

## 2019-02-05 ENCOUNTER — Encounter (HOSPITAL_COMMUNITY): Payer: Self-pay

## 2019-02-05 DIAGNOSIS — M25571 Pain in right ankle and joints of right foot: Secondary | ICD-10-CM

## 2019-02-05 DIAGNOSIS — Y9389 Activity, other specified: Secondary | ICD-10-CM | POA: Diagnosis not present

## 2019-02-05 DIAGNOSIS — Y999 Unspecified external cause status: Secondary | ICD-10-CM | POA: Insufficient documentation

## 2019-02-05 DIAGNOSIS — R600 Localized edema: Secondary | ICD-10-CM | POA: Diagnosis not present

## 2019-02-05 DIAGNOSIS — S90811A Abrasion, right foot, initial encounter: Secondary | ICD-10-CM | POA: Insufficient documentation

## 2019-02-05 DIAGNOSIS — Y92410 Unspecified street and highway as the place of occurrence of the external cause: Secondary | ICD-10-CM | POA: Diagnosis not present

## 2019-02-05 DIAGNOSIS — Z7901 Long term (current) use of anticoagulants: Secondary | ICD-10-CM | POA: Insufficient documentation

## 2019-02-05 DIAGNOSIS — S99921A Unspecified injury of right foot, initial encounter: Secondary | ICD-10-CM | POA: Diagnosis present

## 2019-02-05 DIAGNOSIS — M25511 Pain in right shoulder: Secondary | ICD-10-CM | POA: Insufficient documentation
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
## 2019-02-06 DIAGNOSIS — S90811A Abrasion, right foot, initial encounter: Secondary | ICD-10-CM | POA: Diagnosis not present

## 2019-02-06 MED ORDER — OXYCODONE-ACETAMINOPHEN 5-325 MG PO TABS
1.0000 | ORAL_TABLET | Freq: Once | ORAL | Status: AC
  Administered 2019-02-06: 1 via ORAL
  Filled 2019-02-06: qty 1
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
## 2019-02-12 ENCOUNTER — Telehealth (HOSPITAL_COMMUNITY): Payer: Self-pay | Admitting: *Deleted

## 2019-02-12 NOTE — Telephone Encounter (Signed)

## 2019-02-13 ENCOUNTER — Ambulatory Visit (INDEPENDENT_AMBULATORY_CARE_PROVIDER_SITE_OTHER): Payer: Medicaid Other | Admitting: Vascular Surgery

## 2019-02-13 ENCOUNTER — Other Ambulatory Visit: Payer: Self-pay

## 2019-02-13 ENCOUNTER — Encounter: Payer: Self-pay | Admitting: Vascular Surgery

## 2019-02-13 ENCOUNTER — Ambulatory Visit (HOSPITAL_COMMUNITY)
Admission: RE | Admit: 2019-02-13 | Discharge: 2019-02-13 | Disposition: A | Discharge status: Home / Self Care | Payer: No Typology Code available for payment source | Source: Ambulatory Visit | Attending: Vascular Surgery | Admitting: Vascular Surgery

## 2019-02-13 VITALS — BP 130/93 | HR 85 | Temp 97.4°F | Resp 20 | Ht 63.0 in | Wt 135.5 lb

## 2019-02-13 DIAGNOSIS — R609 Edema, unspecified: Secondary | ICD-10-CM

## 2019-02-13 DIAGNOSIS — I871 Compression of vein: Secondary | ICD-10-CM
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
## 2019-02-16 ENCOUNTER — Other Ambulatory Visit: Payer: Self-pay

## 2019-02-16 DIAGNOSIS — I871 Compression of vein: Secondary | ICD-10-CM

## 2019-04-02 ENCOUNTER — Telehealth: Payer: Self-pay | Admitting: *Deleted

## 2019-04-02 NOTE — Telephone Encounter (Signed)
Patient called and stated "I have surgery on 11/21/2018 with Dr Denman George ([facility], [facility]). For the last week I have been cramping and spotting. But during and after intercourse I have a period type bleeding. I don't have to wear a panty liner, just spotting when I wipe. Is this normal." Message forwarded to [facility] APP

## 2019-04-03 ENCOUNTER — Other Ambulatory Visit: Payer: Self-pay

## 2019-04-03 ENCOUNTER — Ambulatory Visit (INDEPENDENT_AMBULATORY_CARE_PROVIDER_SITE_OTHER): Payer: Medicaid Other | Admitting: Primary Care

## 2019-04-04 ENCOUNTER — Telehealth: Payer: Self-pay | Admitting: *Deleted

## 2019-04-04 NOTE — Telephone Encounter (Signed)
Scheduled pt for an appointment on 04-12-19 to see Dr. Denman George at 1200. Arrival at 1130. Pt verbalized understanding.

## 2019-04-04 NOTE — Telephone Encounter (Signed)
Per [facility] APP called and left the patient a message to call the office back. Need to schedule the patient an appt for a visit

## 2019-04-12 ENCOUNTER — Other Ambulatory Visit: Payer: Self-pay

## 2019-04-12 ENCOUNTER — Inpatient Hospital Stay: Payer: Medicaid Other | Attending: Hematology and Oncology | Admitting: Gynecologic Oncology

## 2019-04-12 ENCOUNTER — Encounter: Payer: Self-pay | Admitting: Gynecologic Oncology

## 2019-04-12 VITALS — BP 150/106 | HR 93 | Temp 97.2°F | Resp 18 | Ht 63.0 in | Wt 142.0 lb

## 2019-04-12 DIAGNOSIS — Z86711 Personal history of pulmonary embolism: Secondary | ICD-10-CM | POA: Diagnosis not present

## 2019-04-12 DIAGNOSIS — Z90710 Acquired absence of both cervix and uterus: Secondary | ICD-10-CM | POA: Diagnosis not present

## 2019-04-12 DIAGNOSIS — Z79818 Long term (current) use of other agents affecting estrogen receptors and estrogen levels: Secondary | ICD-10-CM | POA: Diagnosis not present

## 2019-04-12 DIAGNOSIS — R1030 Lower abdominal pain, unspecified: Secondary | ICD-10-CM | POA: Insufficient documentation

## 2019-04-12 DIAGNOSIS — T8131XA Disruption of external operation (surgical) wound, not elsewhere classified, initial encounter: Secondary | ICD-10-CM | POA: Insufficient documentation

## 2019-04-12 DIAGNOSIS — Z86718 Personal history of other venous thrombosis and embolism: Secondary | ICD-10-CM | POA: Diagnosis not present

## 2019-04-12 DIAGNOSIS — N939 Abnormal uterine and vaginal bleeding, unspecified: Secondary | ICD-10-CM

## 2019-04-12 DIAGNOSIS — N952 Postmenopausal atrophic vaginitis: Secondary | ICD-10-CM | POA: Diagnosis not present

## 2019-04-12 DIAGNOSIS — D250 Submucous leiomyoma of uterus: Secondary | ICD-10-CM | POA: Insufficient documentation

## 2019-04-12 MED ORDER — ESTROGENS, CONJUGATED 0.625 MG/GM VA CREA: 1.0000 | TOPICAL_CREAM | VAGINAL | 1 refills | Status: DC

## 2019-04-12 NOTE — Progress Notes (Signed)
Follow-up Note: Gyn-Onc  Consult was requested by Dr. Alvy Bimler for the evaluation of Holly Graham 39 y.o. female  CC:  Chief Complaint  Patient presents with  . Vaginal Bleeding  . Fibroids  . Lower abdominal pain    Assessment/Plan:  Holly. Gaylan Graham  is a 39 y.o.  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  Recommend pelvic rest (strict) x 2-3 months). Premarin, vaginal x 2 months.  I will see her back for pelvic exam in 1 month.   HPI: Holly Graham is a 39 year old P3 who is seen in consultation at the request of Dr Alvy Bimler for a history of symptomatic uterine fibroids and a recent DVT/PE.  The patient experienced left lower extremity edema and then pulmonary embolism in February 2020.  She was admitted to the hospital and imaging confirmed these clot locations.  The etiology of her clot was felt to be secondary to obstruction from a bulky fibroid uterus which on CT imaging was demonstrated.  She was treated with anticoagulant therapy, and stent placement in the left external iliac vein.  She also had thrombectomy of the left common iliac vein and femoral vein.  Echocardiogram confirmed right ventricular strain but did not show signs of permanent heart damage.  The ejection fraction was preserved.  She was continued on Xarelto anticoagulant therapy after initially being treated with heparin infusion.  However she began experiencing menorrhagia while on anticoagulant therapy.  She had been treated with Megace since her hospitalization.  Endometrial biopsy in March 2020 revealed scant benign endometrial and endocervical mucosa.  She received iron infusions intravenously for her symptomatic anemia and in April 2020 she received a Lupron injection.  Following the Depo-Lupron injection she became  amenorrheic.  She also felt less bulk symptoms from her fibroid uterus in the pelvis.  She felt less anemic.  A transvaginal ultrasound scan that had been performed in February 2020 confirmed a uterus measuring 13.4 x 8.5 x 10.1 cm with a large intramural myometrial mass consistent with a fibroid measuring 9.5 x 11.2 x 8.2 cm.  The right ovary was grossly normal with the exception of a small simple appearing cyst measuring 2.7 cm and the left ovary was normal at 3.6 x 4.3 cm.  MRI of the pelvis performed on [place] 03/18/2019 confirmed similar findings including a uterus containing multiple intramural and submucosal fibroids with total dimensions of 16 x 10.2 x 11.5 cm.  The patient is otherwise a healthy woman.  She does give a history of a strong family history of venous thromboembolic events, though no defined coagulopathy that is identified.  She has had 3 prior vaginal deliveries.  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  She lives with her children who are teenage.  Her any abdominal surgery was a tubal ligation after childbirth.  She received a preoperative MRI which showed improvement in the size and bulk of her fibroids.  On November 21, 2018 she was taken to the operating room for robotic assisted total hysterectomy for uterus greater than 250 g with bilateral salpingectomy.  Intraoperative findings were significant for a uterus measuring 14 cm with bulky intramural fibroids, normal-appearing ovaries and tubes bilaterally.  Due to the large size of the uterus a cruciate incision on the vaginal cuff was made for  specimen delivery vaginally.  Surgery was uncomplicated and she was discharged home the same day.  At her postop visit she was diagnosed with presumed vaginal cuff cellulitis and treated with oral antibiotics and resolved symptoms.  Interval Hx:  She reported that she resumed intercourse normally throughout the end of 2020.  In December 2020 after intercourse she began having  vaginal spotting that was light.  She did not have to wear panty liner but she did notice spotting when she wiped.  She also noticed cramping like menstrual-like cramps.  Current Meds:  Outpatient Encounter Medications as of 04/12/2019  Medication Sig  . acetaminophen (TYLENOL) 500 MG tablet Take 1,000 mg by mouth every 6 (six) hours as needed for mild pain.  . Carboxymethylcellul-Glycerin (LUBRICATING EYE DROPS OP) Place 1 drop into both eyes daily as needed (dryness).  Derrill Memo ON 04/13/2019] conjugated estrogens (PREMARIN) vaginal cream Place 1 Applicatorful vaginally 3 (three) times a week.  . [DISCONTINUED] ciprofloxacin (CIPRO) 250 MG tablet Take 1 tablet (250 mg total) by mouth 2 (two) times daily.  . [DISCONTINUED] enoxaparin (LOVENOX) 60 MG/0.6ML injection Inject 0.6 mLs (60 mg total) into the skin every 12 (twelve) hours for 28 doses. 7 days preop and 7 days post op  . [DISCONTINUED] metroNIDAZOLE (FLAGYL) 500 MG tablet Take 1 tablet (500 mg total) by mouth 3 (three) times daily.  . [DISCONTINUED] ondansetron (ZOFRAN) 4 MG tablet Take 1 tablet (4 mg total) by mouth every 8 (eight) hours as needed for nausea or vomiting.  . [DISCONTINUED] oxyCODONE (OXY IR/ROXICODONE) 5 MG immediate release tablet Take 1 tablet (5 mg total) by mouth every 4 (four) hours as needed for severe pain. For AFTER surgery, do not take and drive  . [DISCONTINUED] rivaroxaban (XARELTO) 20 MG TABS tablet Take 1 tablet (20 mg total) by mouth daily with supper.  . [DISCONTINUED] senna-docusate (SENOKOT-S) 8.6-50 MG tablet Take 2 tablets by mouth at bedtime. For AFTER surgery, do not take if having diarrhea   No facility-administered encounter medications on file as of 04/12/2019.    Allergy: No Known Allergies  Social Hx:   Social History   Socioeconomic History  . Marital status: Single    Spouse name: Not on file  . Number of children: Not on file  . Years of education: Not on file  . Highest education level:  Not on file  Occupational History  . Not on file  Tobacco Use  . Smoking status: Never Smoker  . Smokeless tobacco: Never Used  Substance and Sexual Activity  . Alcohol use: Yes  . Drug use: Never  . Sexual activity: Yes    Birth control/protection: Injection  Other Topics Concern  . Not on file  Social History Narrative  . Not on file   Social Determinants of Health   Financial Resource Strain:   . Difficulty of Paying Living Expenses: Not on file  Food Insecurity: No Food Insecurity  . Worried About Charity fundraiser in the Last Year: Never true  . Ran Out of Food in the Last Year: Never true  Transportation Needs: No Transportation Needs  . Lack of Transportation (Medical): No  . Lack of Transportation (Non-Medical): No  Physical Activity:   . Days of Exercise per Week: Not on file  . Minutes of Exercise per Session: Not on file  Stress:   . Feeling of Stress : Not on file  Social Connections:   . Frequency of Communication with Friends and Family: Not on file  .  Frequency of Social Gatherings with Friends and Family: Not on file  . Attends Religious Services: Not on file  . Active Member of Clubs or Organizations: Not on file  . Attends Archivist Meetings: Not on file  . Marital Status: Not on file  Intimate Partner Violence:   . Fear of Current or Ex-Partner: Not on file  . Emotionally Abused: Not on file  . Physically Abused: Not on file  . Sexually Abused: Not on file    Past Surgical Hx:  Past Surgical History:  Procedure Laterality Date  . DILATION AND CURETTAGE OF UTERUS    . INTRAVASCULAR ULTRASOUND/IVUS N/A 05/15/2018   Procedure: INTRAVASCULAR ULTRASOUND/IVUS;  Surgeon: Waynetta Sandy, MD;  Location: [place] [facility];  Service: Cardiovascular;  Laterality: N/A;  . PERIPHERAL VASCULAR THROMBECTOMY N/A 05/15/2018   Procedure: PERIPHERAL VASCULAR THROMBECTOMY;  Surgeon: Waynetta Sandy, MD;  Location: [place] [facility];   Service: Cardiovascular;  Laterality: N/A;  . ROBOTIC ASSISTED TOTAL HYSTERECTOMY WITH BILATERAL SALPINGO OOPHERECTOMY N/A 11/21/2018   Procedure: XI ROBOTIC ASSISTED TOTAL HYSTERECTOMY WITH BILATERAL SALPINGECTOMY GREATER THAN 250 GRAMS;  Surgeon: Everitt Amber, MD;  Location: [facility];  Service: Gynecology;  Laterality: N/A;  . TUBAL LIGATION      Past Medical Hx:  Past Medical History:  Diagnosis Date  . DVT (deep venous thrombosis) ([place])   . Fibroids 05/11/2018  . History of blood transfusion   . Iron deficiency anemia   . Pulmonary emboli ([facility])    Bilateral    Past Gynecological History: See HPI No LMP recorded. Patient has had an injection.  Family Hx:  Family History  Problem Relation Age of Onset  . Pulmonary embolism Brother   . Diabetes Maternal Grandmother     Review of Systems:  Constitutional  Feels well,    ENT Normal appearing ears and nares bilaterally Skin/Breast  No rash, sores, jaundice, itching, dryness Cardiovascular  No chest pain, shortness of breath, or edema  Pulmonary  No cough or wheeze.  Gastro Intestinal  No nausea, vomitting, or diarrhoea. No bright red blood per rectum, no abdominal pain, change in bowel movement, or constipation.  Genito Urinary  No frequency, urgency, dysuria, amenorrhea Musculo Skeletal  No myalgia, arthralgia, joint swelling or pain  Neurologic  No weakness, numbness, change in gait,  Psychology  No depression, anxiety, insomnia.   Vitals:  Blood pressure (!) 150/106, pulse 93, temperature (!) 97.2 F (36.2 C), temperature source Temporal, resp. rate 18, height 5\' 3"  (1.6 m), weight 142 lb (64.4 kg), SpO2 100 %.  Physical Exam: WD in NAD Neck  Supple NROM, without any enlargements.  Lymph Node Survey No cervical supraclavicular or inguinal adenopathy Cardiovascular  Pulse normal rate, regularity and rhythm. S1 and S2 normal.  Lungs  Clear to auscultation bilateraly, without wheezes/crackles/rhonchi. Good air  movement.  Skin  No rash/lesions/breakdown  Psychiatry  Alert and oriented to person, place, and time  Abdomen  Normoactive bowel sounds, abdomen soft, non-tender and obese without evidence of hernia. Mass (mobile) appreciated in lower right abdomen Back No CVA tenderness Genito Urinary  There is a 1-1/2 cm area of raw vaginal cuff without mucosal epithelialization in the center.  Palpably is intact with no defect.  There is no evisceration.  There is no apparent cellulitis.  There is some watery pink discharge in the vaginal vault. Rectal  deferred Extremities  No bilateral cyanosis, clubbing or edema.   Thereasa Solo, MD  04/12/2019, 1:04  PM

## 2019-04-12 NOTE — Patient Instructions (Signed)
There is a separation of the skin layer at the top of the vagina where the uterus used to be. This weakens the wall of the vagina. It means that it is important to "rest" the vagina by not putting anything (vaginal intercourse) into the vagina until it fully heals. This may be 2-3 weeks.  In the meantime, Dr Denman George recommends using vaginal estrogen cream 3 times per week (or more frequently, it is safe to use daily). It is best to place the vaginal estrogen cream in the vagina at night right before bedtime.   Dr Denman George will see you again in 1 month to recheck the vagina.  If you notice heavy vaginal bleeding (like a period) or large volume gushing, please let Dr Denman George know at [phone].

## 2019-05-09 ENCOUNTER — Encounter: Payer: Self-pay | Admitting: Gynecologic Oncology

## 2019-05-09 ENCOUNTER — Other Ambulatory Visit: Payer: Self-pay

## 2019-05-09 ENCOUNTER — Inpatient Hospital Stay: Payer: Medicaid Other | Attending: Hematology and Oncology | Admitting: Gynecologic Oncology

## 2019-05-09 VITALS — BP 138/95 | HR 85 | Temp 98.3°F | Resp 17 | Ht 63.0 in | Wt 140.5 lb

## 2019-05-09 DIAGNOSIS — Z7901 Long term (current) use of anticoagulants: Secondary | ICD-10-CM | POA: Diagnosis not present

## 2019-05-09 DIAGNOSIS — Z90710 Acquired absence of both cervix and uterus: Secondary | ICD-10-CM | POA: Insufficient documentation

## 2019-05-09 DIAGNOSIS — D250 Submucous leiomyoma of uterus: Secondary | ICD-10-CM | POA: Diagnosis not present

## 2019-05-09 DIAGNOSIS — T8131XS Disruption of external operation (surgical) wound, not elsewhere classified, sequela: Secondary | ICD-10-CM | POA: Diagnosis present

## 2019-05-09 DIAGNOSIS — T8131XA Disruption of external operation (surgical) wound, not elsewhere classified, initial encounter: Secondary | ICD-10-CM

## 2019-05-09 DIAGNOSIS — Z86718 Personal history of other venous thrombosis and embolism: Secondary | ICD-10-CM | POA: Insufficient documentation

## 2019-05-09 DIAGNOSIS — Z79899 Other long term (current) drug therapy: Secondary | ICD-10-CM | POA: Diagnosis not present

## 2019-05-09 DIAGNOSIS — Z90722 Acquired absence of ovaries, bilateral: Secondary | ICD-10-CM | POA: Diagnosis not present

## 2019-05-09 DIAGNOSIS — C541 Malignant neoplasm of endometrium: Secondary | ICD-10-CM

## 2019-05-09 MED ORDER — ESTRADIOL 0.1 MG/GM VA CREA: 1.0000 | TOPICAL_CREAM | Freq: Every day | VAGINAL | 2 refills | Status: DC

## 2019-05-09 NOTE — Progress Notes (Signed)
Follow-up Note: Gyn-Onc  Consult was requested by Dr. Alvy Bimler for the evaluation of Holly Graham 39 y.o. female  CC:  Chief Complaint  Patient presents with  . Dehiscence of vaginal cuff, initial encounter    Follow Up    Assessment/Plan:  Holly Graham  is a 39 y.o.  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 Healing well but incompletely  Recommend pelvic rest (strict) x 2-3 months). Estrace, vaginal x 1 months.  I will see her back for pelvic exam in 1 month.   HPI: Holly Graham is a 39 year old P3 who is seen in consultation at the request of Dr Alvy Bimler for a history of symptomatic uterine fibroids and a recent DVT/PE.  The patient experienced left lower extremity edema and then pulmonary embolism in February 2020.  She was admitted to the hospital and imaging confirmed these clot locations.  The etiology of her clot was felt to be secondary to obstruction from a bulky fibroid uterus which on CT imaging was demonstrated.  She was treated with anticoagulant therapy, and stent placement in the left external iliac vein.  She also had thrombectomy of the left common iliac vein and femoral vein.  Echocardiogram confirmed right ventricular strain but did not show signs of permanent heart damage.  The ejection fraction was preserved.  She was continued on Xarelto anticoagulant therapy after initially being treated with heparin infusion.  However she began experiencing menorrhagia while on anticoagulant therapy.  She had been treated with Megace since her hospitalization.  Endometrial biopsy in March 2020 revealed scant benign endometrial and endocervical mucosa.  She received iron infusions intravenously for her symptomatic anemia and in April 2020 she received a Lupron injection.  Following the  Depo-Lupron injection she became amenorrheic.  She also felt less bulk symptoms from her fibroid uterus in the pelvis.  She felt less anemic.  A transvaginal ultrasound scan that had been performed in February 2020 confirmed a uterus measuring 13.4 x 8.5 x 10.1 cm with a large intramural myometrial mass consistent with a fibroid measuring 9.5 x 11.2 x 8.2 cm.  The right ovary was grossly normal with the exception of a small simple appearing cyst measuring 2.7 cm and the left ovary was normal at 3.6 x 4.3 cm.  MRI of the pelvis performed on [facility] 03/18/2019 confirmed similar findings including a uterus containing multiple intramural and submucosal fibroids with total dimensions of 16 x 10.2 x 11.5 cm.  The patient is otherwise a healthy woman.  She does give a history of a strong family history of venous thromboembolic events, though no defined coagulopathy that is identified.  She has had 3 prior vaginal deliveries.  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  She lives with her children who are teenage.  Her any abdominal surgery was a tubal ligation after childbirth.  She received a preoperative MRI which showed improvement in the size and bulk of her fibroids.  On November 21, 2018 she was taken to the operating room for robotic assisted total hysterectomy for uterus greater than 250 g with bilateral salpingectomy.  Intraoperative findings were significant for a uterus measuring 14 cm with bulky intramural fibroids, normal-appearing ovaries and tubes bilaterally.  Due to the large size of the uterus a cruciate incision on the  vaginal cuff was made for specimen delivery vaginally.  Surgery was uncomplicated and she was discharged home the same day.  At her postop visit she was diagnosed with presumed vaginal cuff cellulitis and treated with oral antibiotics and resolved symptoms.  Interval Hx:  She reported that she resumed intercourse normally throughout the end of 2020.  In December 2020  after intercourse she began having vaginal spotting that was light.  She did not have to wear panty liner but she did notice spotting when she wiped.  She also noticed cramping like menstrual-like cramps.  She was diagnosed with a vaginal mucosal dehiscence and prescribed premarin cream to use for 2 months however this was prohibitively expensive. She was on pelvic rest for 1 month. She denied ongoing bleeding symptoms.   Current Meds:  Outpatient Encounter Medications as of 05/09/2019  Medication Sig  . acetaminophen (TYLENOL) 500 MG tablet Take 1,000 mg by mouth every 6 (six) hours as needed for mild pain.  . Carboxymethylcellul-Glycerin (LUBRICATING EYE DROPS OP) Place 1 drop into both eyes daily as needed (dryness).  . conjugated estrogens (PREMARIN) vaginal cream Place 1 Applicatorful vaginally 3 (three) times a week.  . estradiol (ESTRACE VAGINAL) 0.1 MG/GM vaginal cream Place 1 Applicatorful vaginally at bedtime.   No facility-administered encounter medications on file as of 05/09/2019.    Allergy: No Known Allergies  Social Hx:   Social History   Socioeconomic History  . Marital status: Single    Spouse name: Not on file  . Number of children: Not on file  . Years of education: Not on file  . Highest education level: Not on file  Occupational History  . Not on file  Tobacco Use  . Smoking status: Never Smoker  . Smokeless tobacco: Never Used  Substance and Sexual Activity  . Alcohol use: Yes  . Drug use: Never  . Sexual activity: Yes    Birth control/protection: Injection  Other Topics Concern  . Not on file  Social History Narrative  . Not on file   Social Determinants of Health   Financial Resource Strain:   . Difficulty of Paying Living Expenses: Not on file  Food Insecurity: No Food Insecurity  . Worried About Charity fundraiser in the Last Year: Never true  . Ran Out of Food in the Last Year: Never true  Transportation Needs: No Transportation Needs  .  Lack of Transportation (Medical): No  . Lack of Transportation (Non-Medical): No  Physical Activity:   . Days of Exercise per Week: Not on file  . Minutes of Exercise per Session: Not on file  Stress:   . Feeling of Stress : Not on file  Social Connections:   . Frequency of Communication with Friends and Family: Not on file  . Frequency of Social Gatherings with Friends and Family: Not on file  . Attends Religious Services: Not on file  . Active Member of Clubs or Organizations: Not on file  . Attends Archivist Meetings: Not on file  . Marital Status: Not on file  Intimate Partner Violence:   . Fear of Current or Ex-Partner: Not on file  . Emotionally Abused: Not on file  . Physically Abused: Not on file  . Sexually Abused: Not on file    Past Surgical Hx:  Past Surgical History:  Procedure Laterality Date  . DILATION AND CURETTAGE OF UTERUS    . INTRAVASCULAR ULTRASOUND/IVUS N/A 05/15/2018   Procedure: INTRAVASCULAR ULTRASOUND/IVUS;  Surgeon: Waynetta Sandy,  MD;  Location: [facility];  Service: Cardiovascular;  Laterality: N/A;  . PERIPHERAL VASCULAR THROMBECTOMY N/A 05/15/2018   Procedure: PERIPHERAL VASCULAR THROMBECTOMY;  Surgeon: Waynetta Sandy, MD;  Location: [facility];  Service: Cardiovascular;  Laterality: N/A;  . ROBOTIC ASSISTED TOTAL HYSTERECTOMY WITH BILATERAL SALPINGO OOPHERECTOMY N/A 11/21/2018   Procedure: XI ROBOTIC ASSISTED TOTAL HYSTERECTOMY WITH BILATERAL SALPINGECTOMY GREATER THAN 250 GRAMS;  Surgeon: Everitt Amber, MD;  Location: [facility];  Service: Gynecology;  Laterality: N/A;  . TUBAL LIGATION      Past Medical Hx:  Past Medical History:  Diagnosis Date  . DVT (deep venous thrombosis) ([place])   . Fibroids 05/11/2018  . History of blood transfusion   . Iron deficiency anemia   . Pulmonary emboli ([facility])    Bilateral    Past Gynecological History: See HPI No LMP recorded. Patient has had an injection.  Family  Hx:  Family History  Problem Relation Age of Onset  . Pulmonary embolism Brother   . Diabetes Maternal Grandmother     Review of Systems:  Constitutional  Feels well,    ENT Normal appearing ears and nares bilaterally Skin/Breast  No rash, sores, jaundice, itching, dryness Cardiovascular  No chest pain, shortness of breath, or edema  Pulmonary  No cough or wheeze.  Gastro Intestinal  No nausea, vomitting, or diarrhoea. No bright red blood per rectum, no abdominal pain, change in bowel movement, or constipation.  Genito Urinary  No frequency, urgency, dysuria, amenorrhea Musculo Skeletal  No myalgia, arthralgia, joint swelling or pain  Neurologic  No weakness, numbness, change in gait,  Psychology  No depression, anxiety, insomnia.   Vitals:  Blood pressure (!) 138/95, pulse 85, temperature 98.3 F (36.8 C), temperature source Temporal, resp. rate 17, height 5\' 3"  (1.6 m), weight 140 lb 8 oz (63.7 kg), SpO2 100 %.  Physical Exam: WD in NAD Neck  Supple NROM, without any enlargements.  Lymph Node Survey No cervical supraclavicular or inguinal adenopathy Cardiovascular  Pulse normal rate, regularity and rhythm. S1 and S2 normal.  Lungs  Clear to auscultation bilateraly, without wheezes/crackles/rhonchi. Good air movement.  Skin  No rash/lesions/breakdown  Psychiatry  Alert and oriented to person, place, and time  Abdomen  Normoactive bowel sounds, abdomen soft, non-tender and obese without evidence of hernia. Mass (mobile) appreciated in lower right abdomen Back No CVA tenderness Genito Urinary  There is a 1-1/2 cm area of white, non-bleeding granulation tissue at the cuff. No longer erythematous. Healing. No drainage or discharge.  Rectal  deferred Extremities  No bilateral cyanosis, clubbing or edema.   Thereasa Solo, MD  05/09/2019, 3:25 PM

## 2019-05-09 NOTE — Patient Instructions (Signed)
Dr Denman George has prescribed estrace cream to use at the top of the vagina 3-5 times per week at bedtime.  She will see you back in a month to check on the vaginal healing.  She recommends no sex for another month until she has evaluated the vagina.

## 2019-05-14 ENCOUNTER — Telehealth: Payer: Self-pay

## 2019-05-14 DIAGNOSIS — T8131XA Disruption of external operation (surgical) wound, not elsewhere classified, initial encounter: Secondary | ICD-10-CM

## 2019-05-14 NOTE — Telephone Encounter (Signed)
Called [employer] to inciate a prior authorization  For Estrace vaginal cream. Gave Dx. Dehiscence of Vaginal Cuff- ICD-10 code T 81.31XD Failed Premarin vaginal cream. Stated that if cream not approved that pt.s vaginal cuff could separate and vagina and abdomen would open and bowels could come through/bleeding. Surgery repair would be needed.   PA Ref # [id number]  Can call back to [facility] [phone] for update in 24 hrs on status of Prior auth.

## 2019-05-16 NOTE — Telephone Encounter (Signed)
Called [facility] yesterday and spoke with [facility] pharmacist and gave her the PA reference number and said that the prior auth was approved for the estrace.The approval was good through 05-08-20. Stanton Kidney stated that the medicaid number is not lining upfor coverage.  The patient is to bring her card in today to verify insurance ID numbers.

## 2019-05-17 NOTE — Telephone Encounter (Signed)
Spoke with pharmacist today and pt has medication. The issue was resolved with her insurance card.

## 2019-06-05 ENCOUNTER — Encounter: Payer: Self-pay | Admitting: Gynecologic Oncology

## 2019-06-05 ENCOUNTER — Other Ambulatory Visit: Payer: Self-pay

## 2019-06-05 ENCOUNTER — Inpatient Hospital Stay: Payer: Medicaid Other | Attending: Hematology and Oncology | Admitting: Gynecologic Oncology

## 2019-06-05 VITALS — BP 143/71 | HR 105 | Temp 98.2°F | Resp 17 | Ht 63.0 in | Wt 139.2 lb

## 2019-06-05 DIAGNOSIS — Z90722 Acquired absence of ovaries, bilateral: Secondary | ICD-10-CM | POA: Insufficient documentation

## 2019-06-05 DIAGNOSIS — Z90710 Acquired absence of both cervix and uterus: Secondary | ICD-10-CM | POA: Insufficient documentation

## 2019-06-05 DIAGNOSIS — T8131XD Disruption of external operation (surgical) wound, not elsewhere classified, subsequent encounter: Secondary | ICD-10-CM | POA: Diagnosis present

## 2019-06-05 NOTE — Patient Instructions (Signed)
The vaginal tissues have healed well. It is safe to continue the vaginal estrogen until it is completed then no refills are needed. It is safe to resume intercourse.  Contact Dr Denman George at [phone] with questions.

## 2019-06-05 NOTE — Progress Notes (Signed)
Follow-up Note: Gyn-Onc  Consult was requested by Dr. Alvy Bimler for the evaluation of Holly Graham 39 y.o. female  CC:  Chief Complaint  Patient presents with  . Dehiscence of vaginal cuff, subsequent encounter    Assessment/Plan:  Ms. Holly Graham  is a 39 y.o.  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 Healing complete She will follow up with her gynecologist for routine gynecologic care.  HPI: Ms Holly Graham is a 39 year old P3 who is seen in consultation at the request of Dr Alvy Bimler for a history of symptomatic uterine fibroids and a recent DVT/PE.  The patient experienced left lower extremity edema and then pulmonary embolism in February 2020.  She was admitted to the hospital and imaging confirmed these clot locations.  The etiology of her clot was felt to be secondary to obstruction from a bulky fibroid uterus which on CT imaging was demonstrated.  She was treated with anticoagulant therapy, and stent placement in the left external iliac vein.  She also had thrombectomy of the left common iliac vein and femoral vein.  Echocardiogram confirmed right ventricular strain but did not show signs of permanent heart damage.  The ejection fraction was preserved.  She was continued on Xarelto anticoagulant therapy after initially being treated with heparin infusion.  However she began experiencing menorrhagia while on anticoagulant therapy.  She had been treated with Megace since her hospitalization.  Endometrial biopsy in March 2020 revealed scant benign endometrial and endocervical mucosa.  She received iron infusions intravenously for her symptomatic anemia and in April 2020 she received a Lupron injection.  Following the Depo-Lupron injection she became amenorrheic.  She also felt less bulk symptoms from  her fibroid uterus in the pelvis.  She felt less anemic.  A transvaginal ultrasound scan that had been performed in February 2020 confirmed a uterus measuring 13.4 x 8.5 x 10.1 cm with a large intramural myometrial mass consistent with a fibroid measuring 9.5 x 11.2 x 8.2 cm.  The right ovary was grossly normal with the exception of a small simple appearing cyst measuring 2.7 cm and the left ovary was normal at 3.6 x 4.3 cm.  MRI of the pelvis performed on [facility] 03/18/2019 confirmed similar findings including a uterus containing multiple intramural and submucosal fibroids with total dimensions of 16 x 10.2 x 11.5 cm.  The patient is otherwise a healthy woman.  She does give a history of a strong family history of venous thromboembolic events, though no defined coagulopathy that is identified.  She has had 3 prior vaginal deliveries.  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  She lives with her children who are teenage.  Her any abdominal surgery was a tubal ligation after childbirth.  She received a preoperative MRI which showed improvement in the size and bulk of her fibroids.  On November 21, 2018 she was taken to the operating room for robotic assisted total hysterectomy for uterus greater than 250 g with bilateral salpingectomy.  Intraoperative findings were significant for a uterus measuring 14 cm with bulky intramural fibroids, normal-appearing ovaries and tubes bilaterally.  Due to the large size of the uterus a cruciate incision on the vaginal cuff was made for specimen delivery vaginally.  Surgery was uncomplicated and she was discharged home the same day.  At  her postop visit she was diagnosed with presumed vaginal cuff cellulitis and treated with oral antibiotics and resolved symptoms.  She reported that she resumed intercourse normally throughout the end of 2020.  In December 2020 after intercourse she began having vaginal spotting that was light.  She did not have to wear panty  liner but she did notice spotting when she wiped.  She also noticed cramping like menstrual-like cramps.  Interval Hx:  She was diagnosed with a vaginal mucosal dehiscence and prescribed estrogen cream which she used for 1 month. She was on pelvic rest for 2 months. She denied ongoing bleeding symptoms.   Current Meds:  Outpatient Encounter Medications as of 06/05/2019  Medication Sig  . acetaminophen (TYLENOL) 500 MG tablet Take 1,000 mg by mouth every 6 (six) hours as needed for mild pain.  Marland Kitchen conjugated estrogens (PREMARIN) vaginal cream Place 1 Applicatorful vaginally 3 (three) times a week.  . estradiol (ESTRACE VAGINAL) 0.1 MG/GM vaginal cream Place 1 Applicatorful vaginally at bedtime.  . [DISCONTINUED] Carboxymethylcellul-Glycerin (LUBRICATING EYE DROPS OP) Place 1 drop into both eyes daily as needed (dryness).   No facility-administered encounter medications on file as of 06/05/2019.    Allergy: No Known Allergies  Social Hx:   Social History   Socioeconomic History  . Marital status: Single    Spouse name: Not on file  . Number of children: Not on file  . Years of education: Not on file  . Highest education level: Not on file  Occupational History  . Not on file  Tobacco Use  . Smoking status: Never Smoker  . Smokeless tobacco: Never Used  Substance and Sexual Activity  . Alcohol use: Yes  . Drug use: Never  . Sexual activity: Yes    Birth control/protection: Injection  Other Topics Concern  . Not on file  Social History Narrative  . Not on file   Social Determinants of Health   Financial Resource Strain:   . Difficulty of Paying Living Expenses: Not on file  Food Insecurity:   . Worried About Charity fundraiser in the Last Year: Not on file  . Ran Out of Food in the Last Year: Not on file  Transportation Needs:   . Lack of Transportation (Medical): Not on file  . Lack of Transportation (Non-Medical): Not on file  Physical Activity:   . Days of Exercise per  Week: Not on file  . Minutes of Exercise per Session: Not on file  Stress:   . Feeling of Stress : Not on file  Social Connections:   . Frequency of Communication with Friends and Family: Not on file  . Frequency of Social Gatherings with Friends and Family: Not on file  . Attends Religious Services: Not on file  . Active Member of Clubs or Organizations: Not on file  . Attends Archivist Meetings: Not on file  . Marital Status: Not on file  Intimate Partner Violence:   . Fear of Current or Ex-Partner: Not on file  . Emotionally Abused: Not on file  . Physically Abused: Not on file  . Sexually Abused: Not on file    Past Surgical Hx:  Past Surgical History:  Procedure Laterality Date  . DILATION AND CURETTAGE OF UTERUS    . INTRAVASCULAR ULTRASOUND/IVUS N/A 05/15/2018   Procedure: INTRAVASCULAR ULTRASOUND/IVUS;  Surgeon: Waynetta Sandy, MD;  Location: [facility];  Service: Cardiovascular;  Laterality: N/A;  . PERIPHERAL VASCULAR THROMBECTOMY N/A 05/15/2018   Procedure:  PERIPHERAL VASCULAR THROMBECTOMY;  Surgeon: Waynetta Sandy, MD;  Location: [facility] [facility];  Service: Cardiovascular;  Laterality: N/A;  . ROBOTIC ASSISTED TOTAL HYSTERECTOMY WITH BILATERAL SALPINGO OOPHERECTOMY N/A 11/21/2018   Procedure: XI ROBOTIC ASSISTED TOTAL HYSTERECTOMY WITH BILATERAL SALPINGECTOMY GREATER THAN 250 GRAMS;  Surgeon: Everitt Amber, MD;  Location: [facility];  Service: Gynecology;  Laterality: N/A;  . TUBAL LIGATION      Past Medical Hx:  Past Medical History:  Diagnosis Date  . DVT (deep venous thrombosis) ([place])   . Fibroids 05/11/2018  . History of blood transfusion   . Iron deficiency anemia   . Pulmonary emboli ([facility])    Bilateral    Past Gynecological History: See HPI No LMP recorded. Patient has had an injection.  Family Hx:  Family History  Problem Relation Age of Onset  . Pulmonary embolism Brother   . Diabetes Maternal Grandmother      Review of Systems:  Constitutional  Feels well,    ENT Normal appearing ears and nares bilaterally Skin/Breast  No rash, sores, jaundice, itching, dryness Cardiovascular  No chest pain, shortness of breath, or edema  Pulmonary  No cough or wheeze.  Gastro Intestinal  No nausea, vomitting, or diarrhoea. No bright red blood per rectum, no abdominal pain, change in bowel movement, or constipation.  Genito Urinary  No frequency, urgency, dysuria, amenorrhea Musculo Skeletal  No myalgia, arthralgia, joint swelling or pain  Neurologic  No weakness, numbness, change in gait,  Psychology  No depression, anxiety, insomnia.   Vitals:  Blood pressure (!) 143/71, pulse (!) 105, temperature 98.2 F (36.8 C), temperature source Temporal, resp. rate 17, height 5\' 3"  (1.6 m), weight 139 lb 3.2 oz (63.1 kg), SpO2 99 %.  Physical Exam: WD in NAD Neck  Supple NROM, without any enlargements.  Lymph Node Survey No cervical supraclavicular or inguinal adenopathy Cardiovascular  Pulse normal rate, regularity and rhythm. S1 and S2 normal.  Lungs  Clear to auscultation bilateraly, without wheezes/crackles/rhonchi. Good air movement.  Skin  No rash/lesions/breakdown  Psychiatry  Alert and oriented to person, place, and time  Abdomen  Normoactive bowel sounds, abdomen soft, non-tender and obese without evidence of hernia.  Back No CVA tenderness Genito Urinary  Completely healed vaginal cuff.  Rectal  deferred Extremities  No bilateral cyanosis, clubbing or edema.   Thereasa Solo, MD  06/05/2019, 2:49 PM

## 2020-02-09 IMAGING — MR MR PELVIS WO/W CM
4 of 10 series · 17 of 48 positions shown · IV contrast (gadavist)
Comparison: 05/08/2018 pelvic sonogram.

CLINICAL DATA: 37-year-old female inpatient admitted with pulmonary
embolism and enlarged myomatous uterus. History of tubal ligation.

EXAM:
MRI PELVIS WITHOUT AND WITH CONTRAST
TECHNIQUE: Multiplanar multisequence MR imaging of the pelvis was performed
both before and after administration of intravenous contrast.
CONTRAST:  6 cc Gadavist IV

[Series 3: T2 · coronal · 5.0mm · 0.59mm/px · 4 of 36 slices shown (1 of 3)]
[im 1/36]
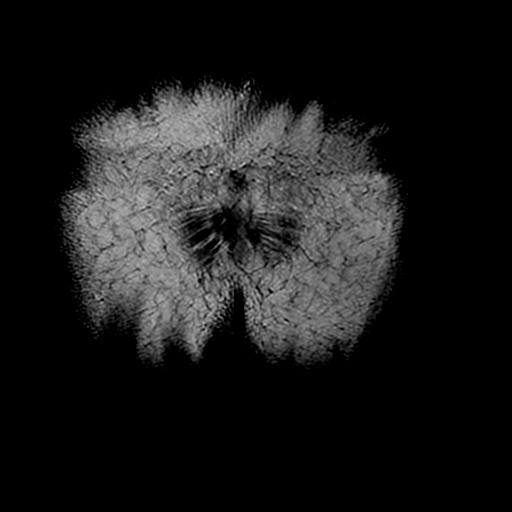
[im 12/36]
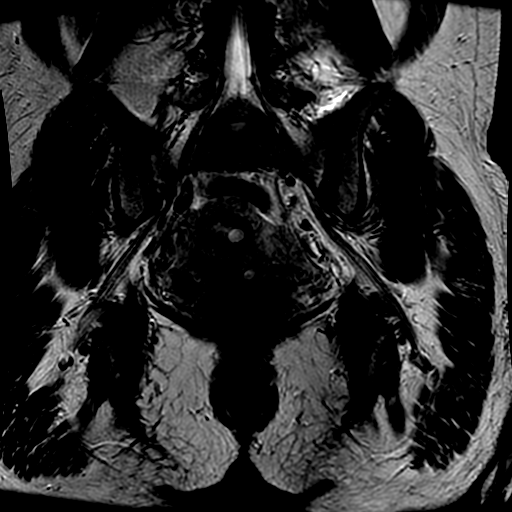
[im 24/36]
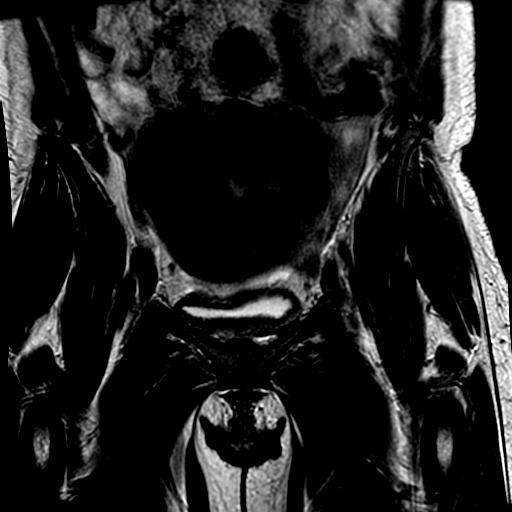
[im 36/36]
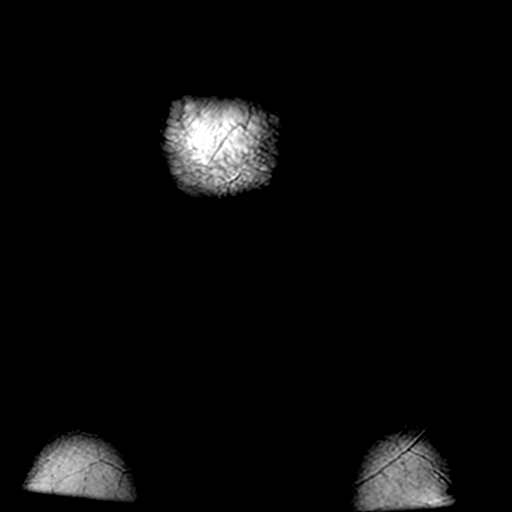

[Series 4: T2 · axial · 5.0mm · 0.51mm/px · z∈[-59,+205]mm · 5 of 45 slices shown (2 of 3)]
[im 1/45]
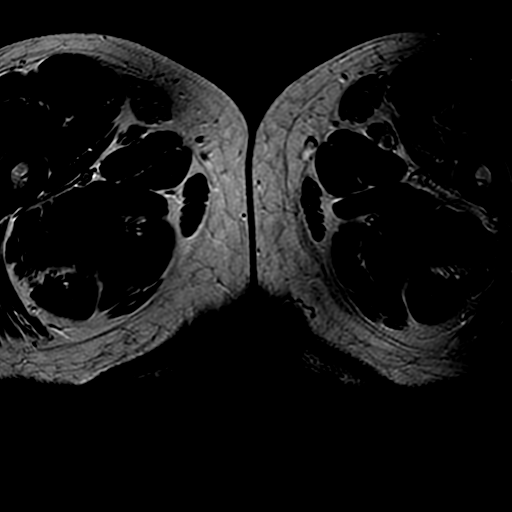
[im 12/45]
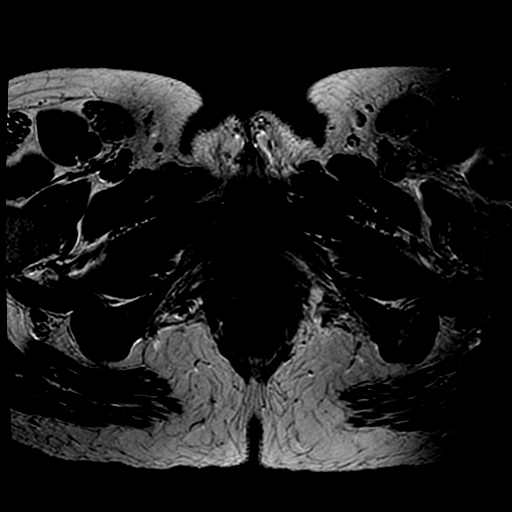
[im 23/45]
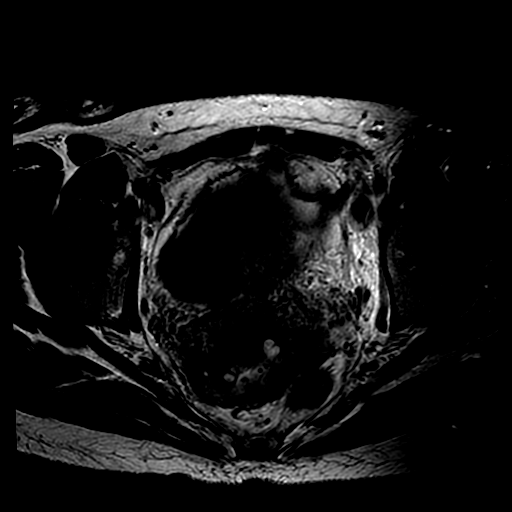
[im 34/45]
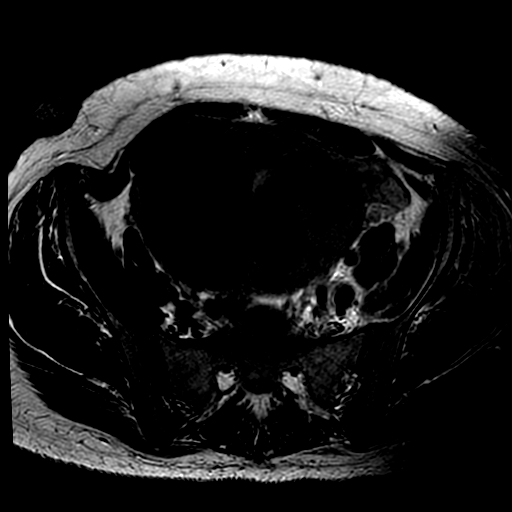
[im 45/45]
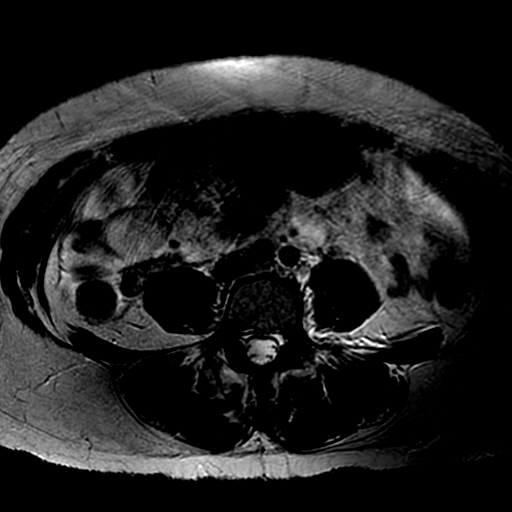

[Series 5: T2 fat-sat · axial · 5.0mm · 0.51mm/px · z∈[-59,+205]mm · 5 of 45 slices shown]
[im 1/45]
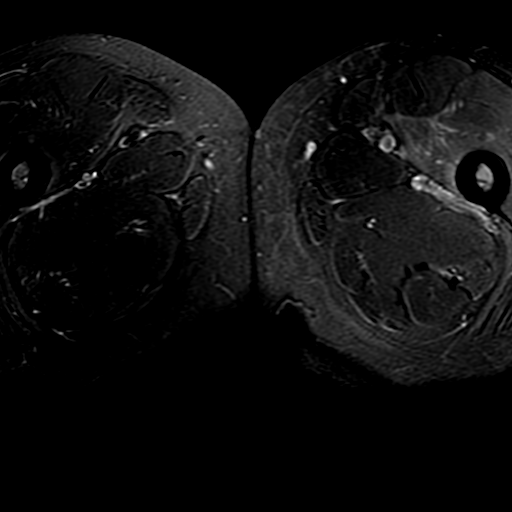
[im 12/45]
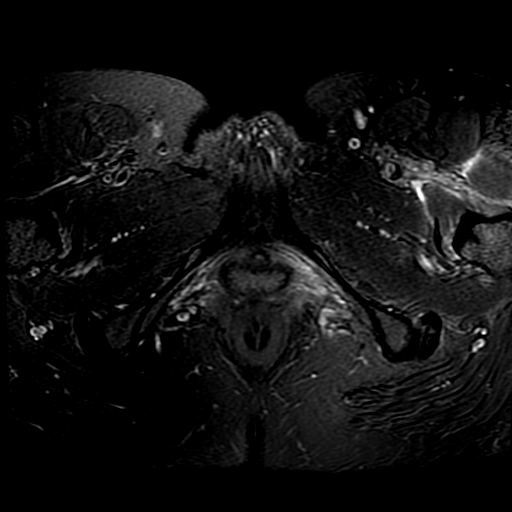
[im 23/45]
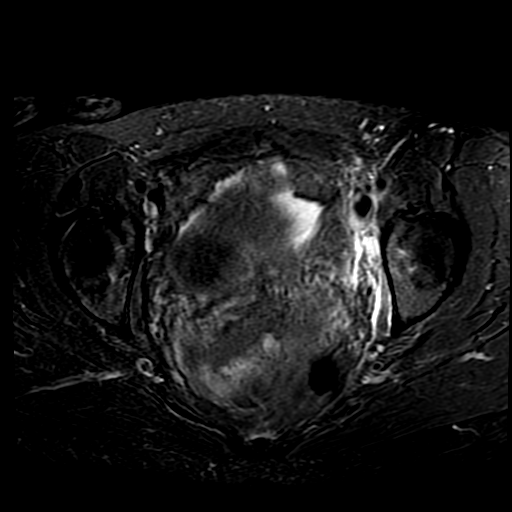
[im 34/45]
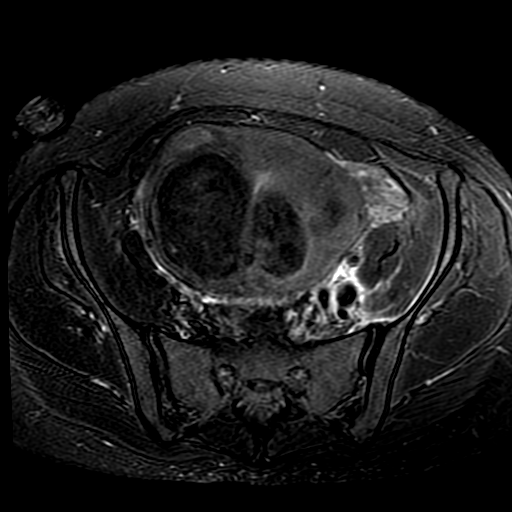
[im 45/45]
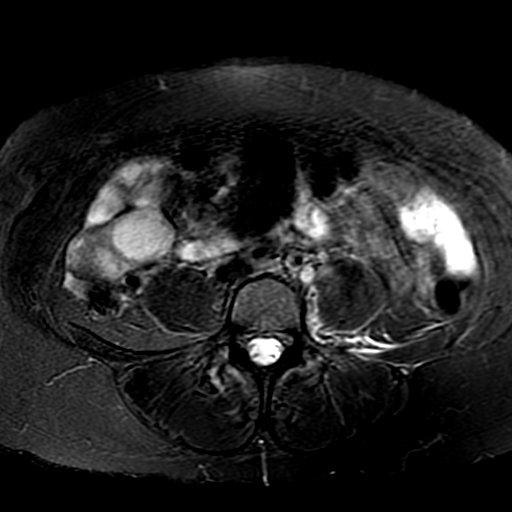

[Series 6: T2 · sagittal · 5.0mm · 0.51mm/px · 3 of 41 slices shown (3 of 3)]
[im 1/41]
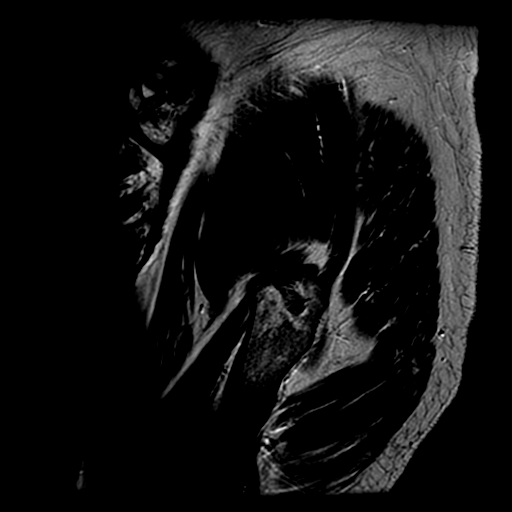
[im 21/41]
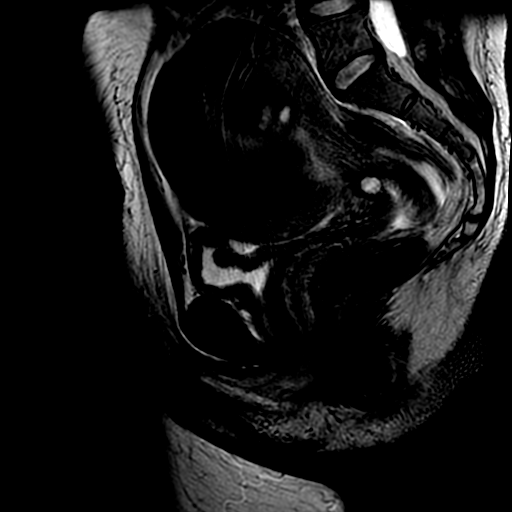
[im 41/41]
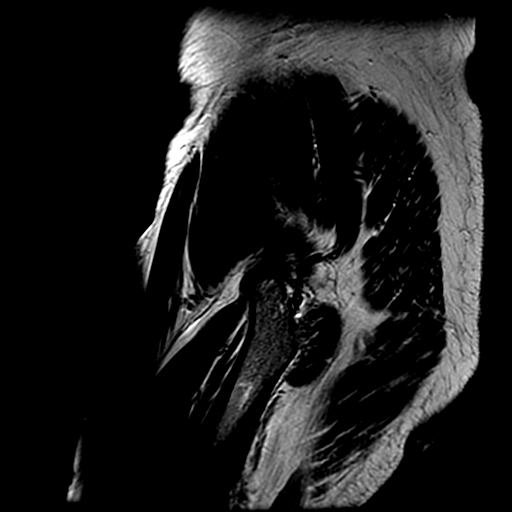

[17 of 48 positions shown; findings below may reference images not displayed]

FINDINGS: Urinary Tract:  Normal nondistended bladder.  Normal urethra.

Bowel: Visualized small and large bowel are normal caliber with no
bowel wall thickening.

Vascular/Lymphatic: No pathologically enlarged lymph nodes in the
pelvis. Small nonocclusive thrombus in the left common femoral vein
(series 10/image 24). Nonocclusive tubular acute thrombi throughout
the left common and external iliac veins. Apparent focal narrowing
of the left common iliac vein (series 10/image 4), although this
does not correlate with extrinsic compression from the enlarged
myomatous uterus on these images. Asymmetric edema surrounding the
length of the left common and external iliac veins. No evidence of
right pelvic deep venous thrombosis.

Reproductive:

Uterus: The enlarged myomatous uterus measures 16.0 x 10.2 x 11.5 cm
(volume = 983 cm^3). There are multiple enhancing uterine fibroids
(greater than 10), with representative fibroids as follows:

-right fundal intramural 6.7 x 4.8 x 4.9 cm (volume = 83 cm^3)
fibroid

-left uterine body submucosal 1.8 x 1.3 x 1.2 cm (volume =
cm^3) fibroid

-posterior left upper uterine body 4.7 x 3.8 x 3.3 cm (volume = 31
cm^3) fibroid with approximately 40% submucosal component

A few of the fibroids demonstrate heterogeneous T2 hyperintensity
indicative of partial cystic degeneration. No intracavitary or
pedunculated fibroids are evident. Inner myometrium (junctional
zone) is poorly delineated and measures approximately 7 mm in
thickness, which is within normal limits. Endometrium is distorted
by surrounding fibroids and measures approximately 6 mm in bilayer
thickness, which is within normal limits. No endometrial cavity
fluid or focal endometrial mass. Scattered small nabothian cysts in
the cervix. Fibrous cervical stroma is intact.

Ovaries and Adnexa: The right ovary measures 5.8 x 3.5 x 5.2 cm and
is normal with several simple follicular cysts measuring up to the
3.2 cm. The left ovary measures 4.5 x 3.0 x 3.1 cm and is normal
with a small hemorrhagic corpus luteum (series 4/image 10). There
are no suspicious ovarian or adnexal masses.

Other: No abnormal free fluid in the pelvis. No focal pelvic fluid
collection.

Musculoskeletal: No aggressive appearing focal osseous lesions.
IMPRESSION: 1. Acute nonocclusive deep venous thrombosis in the left pelvis
involving the left common and external iliac and left common femoral
veins. Apparent focal narrowing of the left common iliac vein, which
does not appear to correlate with extrinsic compression from the
enlarged myomatous uterus. Consider Teillet syndrome.
2. Enlarged myomatous uterus as detailed. Please note that MRI can
not reliably distinguish benign uterine fibroids from
leiomyosarcoma. Although there are no overtly aggressive features
associated with these uterine fibroids on this MRI study, clinical
follow-up is necessary.
3. No ovarian or adnexal abnormality.

## 2020-02-15 IMAGING — DX DG CHEST 1V PORT
1 series · 1 of 1 positions shown · non-contrast
Comparison: 05/08/2018 chest radiograph and CT chest.

CLINICAL DATA: 37 y/o  F; chest pain.

EXAM:
PORTABLE CHEST 1 VIEW

[chest ap]
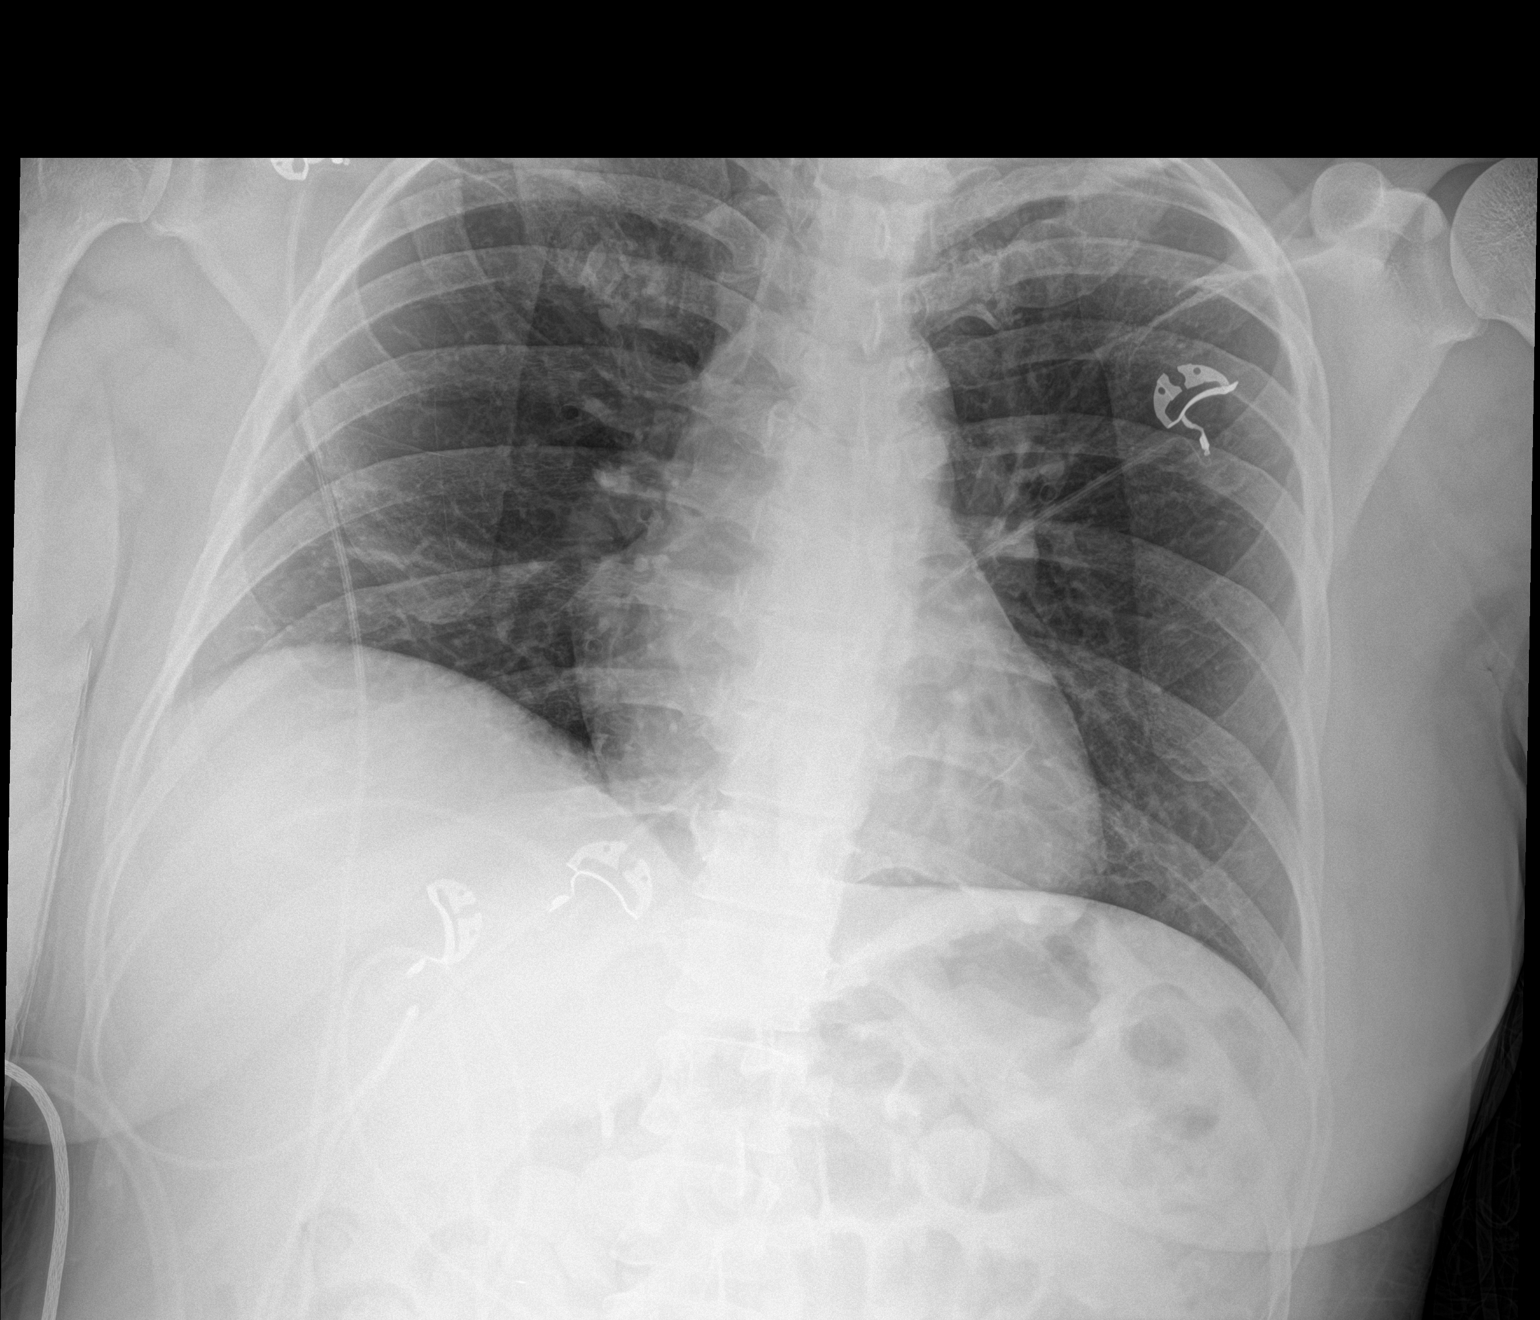

[1 of 1 positions shown; findings below may reference images not displayed]

FINDINGS: Stable heart size and mediastinal contours are within normal limits.
Both lungs are clear. The visualized skeletal structures are
unremarkable.
IMPRESSION: Clear lungs.

## 2020-03-06 ENCOUNTER — Other Ambulatory Visit: Payer: Self-pay | Admitting: *Deleted

## 2020-03-06 DIAGNOSIS — I871 Compression of vein: Secondary | ICD-10-CM

## 2020-03-18 ENCOUNTER — Encounter: Payer: Self-pay | Admitting: Vascular Surgery

## 2020-03-18 ENCOUNTER — Other Ambulatory Visit: Payer: Self-pay

## 2020-03-18 ENCOUNTER — Ambulatory Visit (HOSPITAL_COMMUNITY)
Admission: RE | Admit: 2020-03-18 | Discharge: 2020-03-18 | Disposition: A | Discharge status: Home / Self Care | Payer: 59 | Source: Ambulatory Visit | Attending: Vascular Surgery | Admitting: Vascular Surgery

## 2020-03-18 ENCOUNTER — Ambulatory Visit (INDEPENDENT_AMBULATORY_CARE_PROVIDER_SITE_OTHER): Payer: 59 | Admitting: Vascular Surgery

## 2020-03-18 VITALS — BP 150/108 | HR 80 | Temp 98.3°F | Resp 20 | Ht 63.0 in | Wt 153.0 lb

## 2020-03-18 DIAGNOSIS — I871 Compression of vein: Secondary | ICD-10-CM | POA: Diagnosis present

## 2020-03-18 NOTE — Progress Notes (Signed)
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  Her procedure was performed on 05/15/18.  She stopped the Xarelto 6 months after the stents were placed as directed by our office.  She is taking a baby aspirin.  She reports no significant lower extremity swelling or pain.  Does get some vague pain up in the top of the leg that is usually treated with Tylenol and intermittent.  No new concerns today.  Had a hysterectomy since I saw her last.   Past Medical History:  Diagnosis Date  . DVT (deep venous thrombosis) ([place])   . Fibroids 05/11/2018  . History of blood transfusion   . Iron deficiency anemia   . Pulmonary emboli (HCC)    Bilateral    Past Surgical History:  Procedure Laterality Date  . ABDOMINAL HYSTERECTOMY    . DILATION AND CURETTAGE OF UTERUS    . INTRAVASCULAR ULTRASOUND/IVUS N/A 05/15/2018   Procedure: INTRAVASCULAR ULTRASOUND/IVUS;  Surgeon: Waynetta Sandy, MD;  Location: [facility];  Service: Cardiovascular;  Laterality: N/A;  . PERIPHERAL VASCULAR THROMBECTOMY N/A 05/15/2018   Procedure: PERIPHERAL VASCULAR THROMBECTOMY;  Surgeon: Waynetta Sandy, MD;  Location: [facility];  Service: Cardiovascular;  Laterality: N/A;  . ROBOTIC ASSISTED TOTAL HYSTERECTOMY WITH BILATERAL SALPINGO OOPHERECTOMY N/A 11/21/2018   Procedure: XI ROBOTIC ASSISTED TOTAL HYSTERECTOMY WITH BILATERAL SALPINGECTOMY GREATER THAN 250 GRAMS;  Surgeon: Everitt Amber, MD;  Location: [facility];  Service: Gynecology;  Laterality: N/A;  . TUBAL LIGATION      Family History  Problem Relation Age of Onset  .  Pulmonary embolism Brother   . Diabetes Maternal Grandmother     SOCIAL HISTORY: Social History   Tobacco Use  . Smoking status: Never Smoker  . Smokeless tobacco: Never Used  Substance Use Topics  . Alcohol use: Yes    No Known Allergies  Current Outpatient Medications  Medication Sig Dispense Refill  . acetaminophen (TYLENOL) 500 MG tablet Take 1,000 mg by mouth every 6 (six) hours as needed for mild pain.     No current facility-administered medications for this visit.    REVIEW OF SYSTEMS:  [X]  denotes positive finding, [ ]  denotes negative finding Cardiac  Comments:  Chest pain or chest pressure:    Shortness of breath upon exertion:    Short of breath when lying flat:    Irregular heart rhythm:        Vascular    Pain in calf, thigh, or hip brought on by ambulation:    Pain in feet at night that wakes you up from your sleep:     Blood clot in your veins:    Leg swelling:         Pulmonary    Oxygen at home:    Productive cough:     Wheezing:         Neurologic    Sudden weakness in arms or legs:     Sudden numbness in arms or legs:     Sudden onset of difficulty speaking or slurred speech:    Temporary loss of vision in one eye:  Problems with dizziness:         Gastrointestinal    Blood in stool:     Vomited blood:         Genitourinary    Burning when urinating:     Blood in urine:        Psychiatric    Major depression:         Hematologic    Bleeding problems:    Problems with blood clotting too easily:        Skin    Rashes or ulcers:        Constitutional    Fever or chills:      PHYSICAL EXAM: Vitals:   03/18/20 0901  BP: (!) 150/108  Pulse: 80  Resp: 20  Temp: 98.3 F (36.8 C)  SpO2: 97%  Weight: 153 lb (69.4 kg)  Height: 5\' 3"  (1.6 m)    GENERAL: The patient is a well-nourished female, in no acute distress. The vital signs are documented above. CARDIAC: There is a regular rate and rhythm.  VASCULAR:  Left DP  palpable. No significant left leg swelling. Lower legs are equal in size and unchanged since last visit. PULMONARY: No respiratory distress. ABDOMEN: Soft and non-tender. MUSCULOSKELETAL: There are no major deformities or cyanosis.   DATA:   I reviewed her IVC iliac vein duplex that shows her IVC is patent and left common and external iliac stents are all patent as well.  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  She reports for 1 year follow-up today.  Her left leg looks very good with no significant swelling and no signs of post thrombotic syndrome.  On duplex today her iliac vein stents are patent with no apparent issues.  She completed her anticoagulation 6 months after the stents were placed as we instructed.  She will need to continue baby aspirin.  We will have her follow-up again in 1 year with iliac vein duplex in the [facility].  She knows to call with questions or concerns.   05/17/2018, MD [facility] Office: [phone]  [phone]

## 2020-12-20 ENCOUNTER — Emergency Department (HOSPITAL_COMMUNITY)
Admission: EM | Admit: 2020-12-20 | Discharge: 2020-12-21 | Disposition: A | Discharge status: Home / Self Care | Payer: Medicaid Other | Attending: Emergency Medicine | Admitting: Emergency Medicine

## 2020-12-20 ENCOUNTER — Other Ambulatory Visit: Payer: Self-pay

## 2020-12-20 ENCOUNTER — Emergency Department (HOSPITAL_COMMUNITY): Payer: Medicaid Other

## 2020-12-20 ENCOUNTER — Encounter (HOSPITAL_COMMUNITY): Payer: Self-pay

## 2020-12-20 DIAGNOSIS — R0789 Other chest pain: Secondary | ICD-10-CM | POA: Insufficient documentation

## 2020-12-20 DIAGNOSIS — R0609 Other forms of dyspnea: Secondary | ICD-10-CM | POA: Diagnosis not present

## 2020-12-20 DIAGNOSIS — R079 Chest pain, unspecified: Secondary | ICD-10-CM | POA: Diagnosis not present

## 2020-12-20 DIAGNOSIS — Z5321 Procedure and treatment not carried out due to patient leaving prior to being seen by health care provider: Secondary | ICD-10-CM | POA: Diagnosis not present

## 2020-12-20 LAB — CBC
HCT: 36 % (ref 36.0–46.0)
Hemoglobin: 12.6 g/dL (ref 12.0–15.0)
MCH: 39 pg — ABNORMAL HIGH (ref 26.0–34.0)
MCHC: 35 g/dL (ref 30.0–36.0)
MCV: 111.5 fL — ABNORMAL HIGH (ref 80.0–100.0)
Platelets: 303 10*3/uL (ref 150–400)
RBC: 3.23 MIL/uL — ABNORMAL LOW (ref 3.87–5.11)
RDW: 14.2 % (ref 11.5–15.5)
WBC: 5.3 10*3/uL (ref 4.0–10.5)
nRBC: 0 % (ref 0.0–0.2)

## 2020-12-20 NOTE — ED Triage Notes (Signed)
Pt in for CP. Has a hx of blood clots d/t a hysterectomy last year that required an admission. Pt states the current Chest tightness has lasted a couple of days. Pt states tightness is center of chest. Pt states Chest tightness has pain that's 7/10. Pt endorsing SOB, and dyspnea with exertion.

## 2020-12-21 LAB — BASIC METABOLIC PANEL
Anion gap: 14 (ref 5–15)
BUN: <5 mg/dL — ABNORMAL LOW (ref 6–20)
CO2: 26 mmol/L (ref 22–32)
Calcium: 9.3 mg/dL (ref 8.9–10.3)
Chloride: 102 mmol/L (ref 98–111)
Creatinine, Ser: 0.7 mg/dL (ref 0.44–1.00)
GFR, Estimated: >60 mL/min (ref >60)
Glucose, Bld: 92 mg/dL (ref 70–99)
Potassium: 3.8 mmol/L (ref 3.5–5.1)
Sodium: 142 mmol/L (ref 135–145)

## 2020-12-21 LAB — I-STAT BETA HCG BLOOD, ED (MC, WL, AP ONLY): I-stat hCG, quantitative: <5 m[IU]/mL (ref <5)

## 2020-12-21 LAB — TROPONIN I (HIGH SENSITIVITY): Troponin I (High Sensitivity): 4 ng/L (ref <18)

## 2020-12-21 NOTE — ED Notes (Signed)
Pt called 3x no answer  

## 2020-12-21 NOTE — ED Notes (Signed)
Pt called 2 no answer

## 2021-02-15 ENCOUNTER — Other Ambulatory Visit: Payer: Self-pay

## 2021-02-15 DIAGNOSIS — I871 Compression of vein: Secondary | ICD-10-CM

## 2021-03-11 ENCOUNTER — Inpatient Hospital Stay (HOSPITAL_COMMUNITY)
Admission: EM | Admit: 2021-03-11 | Discharge: 2021-03-15 | DRG: 418 | Disposition: A | Discharge status: Home / Self Care | Payer: BC Managed Care – PPO | Attending: Internal Medicine | Admitting: Internal Medicine

## 2021-03-11 ENCOUNTER — Encounter (HOSPITAL_COMMUNITY): Payer: Self-pay | Admitting: Emergency Medicine

## 2021-03-11 ENCOUNTER — Emergency Department (HOSPITAL_COMMUNITY): Payer: BC Managed Care – PPO

## 2021-03-11 ENCOUNTER — Other Ambulatory Visit: Payer: Self-pay

## 2021-03-11 ENCOUNTER — Ambulatory Visit (HOSPITAL_COMMUNITY)
Admission: EM | Admit: 2021-03-11 | Discharge: 2021-03-11 | Payer: BC Managed Care – PPO | Attending: Physician Assistant | Admitting: Physician Assistant

## 2021-03-11 ENCOUNTER — Encounter (HOSPITAL_COMMUNITY): Payer: Self-pay

## 2021-03-11 ENCOUNTER — Ambulatory Visit (INDEPENDENT_AMBULATORY_CARE_PROVIDER_SITE_OTHER): Payer: Self-pay | Admitting: *Deleted

## 2021-03-11 DIAGNOSIS — Z86711 Personal history of pulmonary embolism: Secondary | ICD-10-CM | POA: Diagnosis not present

## 2021-03-11 DIAGNOSIS — D539 Nutritional anemia, unspecified: Secondary | ICD-10-CM | POA: Diagnosis present

## 2021-03-11 DIAGNOSIS — Z20822 Contact with and (suspected) exposure to covid-19: Secondary | ICD-10-CM | POA: Diagnosis not present

## 2021-03-11 DIAGNOSIS — R1084 Generalized abdominal pain: Secondary | ICD-10-CM | POA: Diagnosis not present

## 2021-03-11 DIAGNOSIS — E876 Hypokalemia: Secondary | ICD-10-CM | POA: Diagnosis not present

## 2021-03-11 DIAGNOSIS — K859 Acute pancreatitis without necrosis or infection, unspecified: Secondary | ICD-10-CM

## 2021-03-11 DIAGNOSIS — K828 Other specified diseases of gallbladder: Secondary | ICD-10-CM | POA: Diagnosis not present

## 2021-03-11 DIAGNOSIS — Z9851 Tubal ligation status: Secondary | ICD-10-CM

## 2021-03-11 DIAGNOSIS — Z90710 Acquired absence of both cervix and uterus: Secondary | ICD-10-CM

## 2021-03-11 DIAGNOSIS — K851 Biliary acute pancreatitis without necrosis or infection: Secondary | ICD-10-CM | POA: Diagnosis not present

## 2021-03-11 DIAGNOSIS — K8010 Calculus of gallbladder with chronic cholecystitis without obstruction: Secondary | ICD-10-CM | POA: Diagnosis not present

## 2021-03-11 DIAGNOSIS — Z86718 Personal history of other venous thrombosis and embolism: Secondary | ICD-10-CM

## 2021-03-11 DIAGNOSIS — K8020 Calculus of gallbladder without cholecystitis without obstruction: Secondary | ICD-10-CM | POA: Diagnosis not present

## 2021-03-11 DIAGNOSIS — R109 Unspecified abdominal pain: Secondary | ICD-10-CM | POA: Diagnosis not present

## 2021-03-11 DIAGNOSIS — R935 Abnormal findings on diagnostic imaging of other abdominal regions, including retroperitoneum: Secondary | ICD-10-CM | POA: Diagnosis not present

## 2021-03-11 LAB — COMPREHENSIVE METABOLIC PANEL
ALT: 15 U/L (ref 0–44)
AST: 34 U/L (ref 15–41)
Albumin: 3.9 g/dL (ref 3.5–5.0)
Alkaline Phosphatase: 96 U/L (ref 38–126)
Anion gap: 11 (ref 5–15)
BUN: 7 mg/dL (ref 6–20)
CO2: 27 mmol/L (ref 22–32)
Calcium: 8 mg/dL — ABNORMAL LOW (ref 8.9–10.3)
Chloride: 95 mmol/L — ABNORMAL LOW (ref 98–111)
Creatinine, Ser: 0.64 mg/dL (ref 0.44–1.00)
GFR, Estimated: >60 mL/min (ref >60)
Glucose, Bld: 100 mg/dL — ABNORMAL HIGH (ref 70–99)
Potassium: 2.7 mmol/L — CL (ref 3.5–5.1)
Sodium: 133 mmol/L — ABNORMAL LOW (ref 135–145)
Total Bilirubin: 1.4 mg/dL — ABNORMAL HIGH (ref 0.3–1.2)
Total Protein: 7.3 g/dL (ref 6.5–8.1)

## 2021-03-11 LAB — POCT URINALYSIS DIPSTICK, ED / UC
Glucose, UA: 100 mg/dL — AB
Hgb urine dipstick: NEGATIVE
Ketones, ur: 15 mg/dL — AB
Leukocytes,Ua: NEGATIVE
Nitrite: NEGATIVE
Protein, ur: 100 mg/dL — AB
Specific Gravity, Urine: 1.02 (ref 1.005–1.030)
Urobilinogen, UA: >=8 mg/dL (ref 0.0–1.0)
pH: 6 (ref 5.0–8.0)

## 2021-03-11 LAB — POC INFLUENZA A AND B ANTIGEN (URGENT CARE ONLY)
INFLUENZA A ANTIGEN, POC: NEGATIVE
INFLUENZA B ANTIGEN, POC: NEGATIVE

## 2021-03-11 LAB — URINALYSIS, ROUTINE W REFLEX MICROSCOPIC
Bilirubin Urine: NEGATIVE
Glucose, UA: NEGATIVE mg/dL
Hgb urine dipstick: NEGATIVE
Ketones, ur: NEGATIVE mg/dL
Leukocytes,Ua: NEGATIVE
Nitrite: NEGATIVE
Protein, ur: NEGATIVE mg/dL
Specific Gravity, Urine: <1.005 — ABNORMAL LOW (ref 1.005–1.030)
pH: 7 (ref 5.0–8.0)

## 2021-03-11 LAB — CBC WITH DIFFERENTIAL/PLATELET
Abs Immature Granulocytes: 0.03 10*3/uL (ref 0.00–0.07)
Basophils Absolute: 0 10*3/uL (ref 0.0–0.1)
Basophils Relative: 0 %
Eosinophils Absolute: 0 10*3/uL (ref 0.0–0.5)
Eosinophils Relative: 1 %
HCT: 34.3 % — ABNORMAL LOW (ref 36.0–46.0)
Hemoglobin: 12.1 g/dL (ref 12.0–15.0)
Immature Granulocytes: 1 %
Lymphocytes Relative: 17 %
Lymphs Abs: 1.1 10*3/uL (ref 0.7–4.0)
MCH: 39.7 pg — ABNORMAL HIGH (ref 26.0–34.0)
MCHC: 35.3 g/dL (ref 30.0–36.0)
MCV: 112.5 fL — ABNORMAL HIGH (ref 80.0–100.0)
Monocytes Absolute: 0.6 10*3/uL (ref 0.1–1.0)
Monocytes Relative: 9 %
Neutro Abs: 4.8 10*3/uL (ref 1.7–7.7)
Neutrophils Relative %: 72 %
Platelets: 176 10*3/uL (ref 150–400)
RBC: 3.05 MIL/uL — ABNORMAL LOW (ref 3.87–5.11)
RDW: 13.8 % (ref 11.5–15.5)
WBC: 6.5 10*3/uL (ref 4.0–10.5)
nRBC: 0 % (ref 0.0–0.2)

## 2021-03-11 LAB — RESP PANEL BY RT-PCR (FLU A&B, COVID) ARPGX2
Influenza A by PCR: NEGATIVE
Influenza B by PCR: NEGATIVE
SARS Coronavirus 2 by RT PCR: NEGATIVE

## 2021-03-11 LAB — LIPASE, BLOOD: Lipase: 204 U/L — ABNORMAL HIGH (ref 11–51)

## 2021-03-11 MED ORDER — ACETAMINOPHEN 650 MG RE SUPP: 650.0000 mg | Freq: Four times a day (QID) | RECTAL | Status: DC | PRN

## 2021-03-11 MED ORDER — SODIUM CHLORIDE 0.9 % IV SOLN: INTRAVENOUS | Status: DC

## 2021-03-11 MED ORDER — HYDROMORPHONE HCL 1 MG/ML IJ SOLN
0.5000 mg | INTRAMUSCULAR | Status: DC | PRN
  Administered 2021-03-12 – 2021-03-14 (×10): 1 mg via INTRAVENOUS
  Administered 2021-03-14: 0.5 mg via INTRAVENOUS
  Administered 2021-03-15: 01:00:00 1 mg via INTRAVENOUS
  Filled 2021-03-11 (×11): qty 1

## 2021-03-11 MED ORDER — SODIUM CHLORIDE 0.9% FLUSH
3.0000 mL | Freq: Two times a day (BID) | INTRAVENOUS | Status: DC
  Administered 2021-03-12 – 2021-03-14 (×4): 3 mL via INTRAVENOUS

## 2021-03-11 MED ORDER — ACETAMINOPHEN 325 MG PO TABS: 650.0000 mg | ORAL_TABLET | Freq: Four times a day (QID) | ORAL | Status: DC | PRN

## 2021-03-11 MED ORDER — POTASSIUM CHLORIDE 10 MEQ/100ML IV SOLN
10.0000 meq | INTRAVENOUS | Status: AC
Start: 2021-03-11 — End: 2021-03-12
  Administered 2021-03-11: 19:00:00 10 meq via INTRAVENOUS
  Filled 2021-03-11 (×2): qty 100

## 2021-03-11 MED ORDER — MORPHINE SULFATE (PF) 4 MG/ML IV SOLN
4.0000 mg | Freq: Once | INTRAVENOUS | Status: AC
  Administered 2021-03-11: 19:00:00 4 mg via INTRAVENOUS
  Filled 2021-03-11: qty 1

## 2021-03-11 MED ORDER — POTASSIUM CHLORIDE 10 MEQ/100ML IV SOLN
10.0000 meq | INTRAVENOUS | Status: DC
  Administered 2021-03-11 – 2021-03-12 (×3): 10 meq via INTRAVENOUS
  Filled 2021-03-11 (×2): qty 100

## 2021-03-11 MED ORDER — ONDANSETRON HCL 4 MG/2ML IJ SOLN
4.0000 mg | Freq: Once | INTRAMUSCULAR | Status: AC
  Administered 2021-03-11: 19:00:00 4 mg via INTRAVENOUS
  Filled 2021-03-11: qty 2

## 2021-03-11 MED ORDER — POLYETHYLENE GLYCOL 3350 17 G PO PACK
17.0000 g | PACK | Freq: Every day | ORAL | Status: DC | PRN
  Administered 2021-03-14 – 2021-03-15 (×2): 17 g via ORAL
  Filled 2021-03-11 (×2): qty 1

## 2021-03-11 MED ORDER — ENOXAPARIN SODIUM 40 MG/0.4ML IJ SOSY
40.0000 mg | PREFILLED_SYRINGE | INTRAMUSCULAR | Status: DC
  Administered 2021-03-11 – 2021-03-14 (×4): 40 mg via SUBCUTANEOUS
  Filled 2021-03-11 (×4): qty 0.4

## 2021-03-11 MED ORDER — IOHEXOL 350 MG/ML SOLN
80.0000 mL | Freq: Once | INTRAVENOUS | Status: AC | PRN
  Administered 2021-03-11: 18:00:00 80 mL via INTRAVENOUS

## 2021-03-11 MED ORDER — HYDROMORPHONE HCL 1 MG/ML IJ SOLN
1.0000 mg | Freq: Once | INTRAMUSCULAR | Status: AC
  Administered 2021-03-11: 22:00:00 1 mg via INTRAVENOUS
  Filled 2021-03-11: qty 1

## 2021-03-11 NOTE — Telephone Encounter (Signed)
Caller at [facility] now . Recommended patient to allow UC to treat her and she may be required to go to ED due to sx.   Chief Complaint: abdominal pain "all over" since 03/08/21. Shortness of breath "breathing in". At [facility] now patient wanted to schedule appt.  Symptoms: tender to touch abd. Lightheaded, short of breath breathing in  Frequency: constant  Pertinent Negatives: Patient denies abd. Bloating .  Disposition: [x] ED /[x] Urgent Care (no appt availability in office) / [] Appointment(In office/virtual)/ []  [place] [facility]/ [] Home Care/ [] Refused Recommended Disposition  Additional Notes: reports hx anemic and no energy . Instructed patient to contact PCP for appt after she has been treated at UC/ED.

## 2021-03-11 NOTE — ED Provider Notes (Signed)
[facility] DEPT Provider Note   CSN: [id number] Arrival date & time: 03/11/21  1501     History Chief Complaint  Patient presents with   Abdominal Pain    Holly Graham is a 40 y.o. female.   Abdominal Pain Pain location:  Epigastric Pain quality: bloating and sharp   Pain radiation: back. Pain severity:  Severe Onset quality:  Sudden Duration:  3 days Timing:  Constant Progression:  Worsening Associated symptoms: anorexia, diarrhea (but no bm now in 2 days), nausea and vomiting   Associated symptoms: no dysuria   No hx of this pain before.  Patient has had a hysterectomy.  She has had pulmonary emboli but is not currently on anticoagulation.  She occasionally drinks alcohol but none recently.    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   Uterine bleeding 05/31/2018   Nausea with vomiting 05/31/2018   Other constipation 05/23/2018   Weight loss, abnormal 05/23/2018   Abnormal uterine bleeding (AUB) 05/11/2018   Fibroids 05/11/2018   Bilateral pulmonary embolism ([place]) 05/08/2018   Hypokalemia 05/08/2018    Past Surgical History:  Procedure Laterality Date   ABDOMINAL HYSTERECTOMY     DILATION AND CURETTAGE OF UTERUS     INTRAVASCULAR ULTRASOUND/IVUS N/A 05/15/2018   Procedure: INTRAVASCULAR ULTRASOUND/IVUS;  Surgeon: Waynetta Sandy, MD;  Location: [facility];  Service: Cardiovascular;  Laterality: N/A;   PERIPHERAL VASCULAR THROMBECTOMY N/A 05/15/2018   Procedure: PERIPHERAL  VASCULAR THROMBECTOMY;  Surgeon: Waynetta Sandy, MD;  Location: [facility];  Service: Cardiovascular;  Laterality: N/A;   ROBOTIC ASSISTED TOTAL HYSTERECTOMY WITH BILATERAL SALPINGO OOPHERECTOMY N/A 11/21/2018   Procedure: XI ROBOTIC ASSISTED TOTAL HYSTERECTOMY WITH BILATERAL SALPINGECTOMY GREATER THAN 250 GRAMS;  Surgeon: Everitt Amber, MD;  Location: [facility];  Service: Gynecology;  Laterality: N/A;   TUBAL LIGATION       OB History     Gravida  4   Para  3   Term  3   Preterm      AB  1   Living  3      SAB  1   IAB  0   Ectopic  0   Multiple  0   Live Births  3           Family History  Problem Relation Age of Onset   Pulmonary embolism Brother    Diabetes Maternal Grandmother     Social History   Tobacco Use   Smoking status: Never   Smokeless tobacco: Never  Vaping Use   Vaping Use: Never used  Substance Use Topics   Alcohol use: Yes   Drug use: Never    Home Medications Prior to Admission medications   Medication Sig Start Date End Date Taking? Authorizing Provider  acetaminophen (TYLENOL) 500 MG tablet Take 1,000 mg by mouth every 6 (six) hours as needed for mild pain.   Yes [provider]  Aspirin-Acetaminophen-Caffeine (GOODY HEADACHE PO) Take 1 Package by mouth  daily as needed (For headache).   Yes [provider]    Allergies    Patient has no known allergies.  Review of Systems   Review of Systems  Gastrointestinal:  Positive for abdominal pain, anorexia, diarrhea (but no bm now in 2 days), nausea and vomiting.  Genitourinary:  Negative for dysuria.  All other systems reviewed and are negative.  Physical Exam Updated Vital Signs BP (!) 134/95    Pulse (!) 105    Temp 98.9 F (37.2 C) (Oral)    Resp 18    Ht 1.6 m (5\' 3" )    Wt 64.4 kg    LMP 08/21/2018    SpO2 100%    BMI 25.15 kg/m   Physical Exam Vitals and nursing note reviewed.  Constitutional:      General: She is not in acute  distress.    Appearance: She is well-developed.  HENT:     Head: Normocephalic and atraumatic.     Right Ear: External ear normal.     Left Ear: External ear normal.  Eyes:     General: No scleral icterus.       Right eye: No discharge.        Left eye: No discharge.     Conjunctiva/sclera: Conjunctivae normal.  Neck:     Trachea: No tracheal deviation.  Cardiovascular:     Rate and Rhythm: Normal rate and regular rhythm.  Pulmonary:     Effort: Pulmonary effort is normal. No respiratory distress.     Breath sounds: Normal breath sounds. No stridor. No wheezing or rales.  Abdominal:     General: Bowel sounds are normal. There is no distension.     Palpations: Abdomen is soft.     Tenderness: There is abdominal tenderness in the epigastric area. There is guarding. There is no rebound.  Musculoskeletal:        General: No tenderness or deformity.     Cervical back: Neck supple.  Skin:    General: Skin is warm and dry.     Findings: No rash.  Neurological:     General: No focal deficit present.     Mental Status: She is alert.     Cranial Nerves: No cranial nerve deficit (no facial droop, extraocular movements intact, no slurred speech).     Sensory: No sensory deficit.     Motor: No abnormal muscle tone or seizure activity.     Coordination: Coordination normal.  Psychiatric:        Mood and Affect: Mood normal.    ED Results / Procedures / Treatments   Labs (all labs ordered are listed, but only abnormal results are displayed) Labs Reviewed  CBC WITH DIFFERENTIAL/PLATELET - Abnormal; Notable for the following components:      Result Value   RBC 3.05 (*)    HCT 34.3 (*)    MCV 112.5 (*)    MCH 39.7 (*)    All other components within normal limits  COMPREHENSIVE METABOLIC PANEL - Abnormal; Notable for the following components:   Sodium 133 (*)    Potassium 2.7 (*)    Chloride 95 (*)    Glucose, Bld 100 (*)    Calcium 8.0 (*)    Total Bilirubin 1.4 (*)    All  other components within normal limits  URINALYSIS, ROUTINE W REFLEX MICROSCOPIC - Abnormal; Notable for the following components:   Specific Gravity, Urine <1.005 (*)    All other components within normal limits  LIPASE, BLOOD - Abnormal; Notable for the following components:   Lipase 204 (*)    All other components within normal limits  RESP PANEL BY RT-PCR (FLU A&B, COVID) [id number]    EKG None  Radiology CT ABDOMEN PELVIS W CONTRAST  Result Date: 03/11/2021 CLINICAL DATA:  Abdominal pain, acute, nonlocalized. Epigastric pain EXAM: CT ABDOMEN AND PELVIS WITH CONTRAST TECHNIQUE: Multidetector CT imaging of the abdomen and pelvis was performed using the standard protocol following bolus administration of intravenous contrast. CONTRAST:  43mL OMNIPAQUE IOHEXOL 350 MG/ML SOLN COMPARISON:  01/05/2019 FINDINGS: Lower chest: No acute abnormality Hepatobiliary: No focal hepatic abnormality. Gallstones within the gallbladder. No visible wall thickening or biliary ductal dilatation. Pancreas: Stranding around the pancreas compatible with acute pancreatitis. No focal pancreatic lesion or ductal dilatation. Spleen: No focal abnormality.  Normal size. Adrenals/Urinary Tract: No adrenal abnormality. No focal renal abnormality. No stones or hydronephrosis. Urinary bladder is unremarkable. Stomach/Bowel: Normal appendix. Stomach, large and small bowel grossly unremarkable. Vascular/Lymphatic: No evidence of aneurysm or adenopathy. Left common iliac vein stent unchanged. Reproductive: Prior hysterectomy. Left ovarian cyst measuring 2.8 cm. Right ovary unremarkable. Other: None Musculoskeletal: No acute bony abnormality. IMPRESSION: Stranding around the pancreas compatible with acute pancreatitis. Cholelithiasis. Electronically Signed   By: Rolm Baptise M.D.   On: 03/11/2021 18:40    Procedures Procedures   Medications Ordered in ED Medications  potassium chloride 10 mEq in 100 mL IVPB (10 mEq Intravenous New  Bag/Given 03/11/21 1909)  HYDROmorphone (DILAUDID) injection 1 mg (has no administration in time range)  iohexol (OMNIPAQUE) 350 MG/ML injection 80 mL (80 mLs Intravenous Contrast Given 03/11/21 1821)  morphine 4 MG/ML injection 4 mg (4 mg Intravenous Given 03/11/21 1859)  ondansetron (ZOFRAN) injection 4 mg (4 mg Intravenous Given 03/11/21 1859)    ED Course  I have reviewed the triage vital signs and the nursing notes.  Pertinent labs & imaging results that were available during my care of the patient were reviewed by me and considered in my medical decision making (see chart for details).  Clinical Course as of 03/11/21 2108  Wed Mar 11, 2021  1749 Labs reviewed.  Potassium decreased to 2.7.  Lipase elevated at 204 [JK]  1749 Hemoglobin stable at 12.1 [JK]  2039 CT scan is consistent with acute pancreatitis [JK]  2040 Patient does have evidence of cholelithiasis but no signs of cholecystitis. [JK]  2040 Bilirubin is slightly increased at 1.4 [JK]  2105 Patient is having pain again.  Will order additional medications. [JK]    Clinical Course User Index [JK] Dorie Rank, MD   MDM Rules/Calculators/A&P                           Patient presented with complaints of upper abdominal pain nausea vomiting.  Presentation concerning for the possibility of pancreatitis bowel obstruction gastritis.  Initial labs were notable for elevation in the lipase as well as slight increase in the bilirubin.  CT scan was performed and it shows signs of gallstones but no definite biliary obstruction.  She does have findings concerning for pancreatitis.  Patient has been given IV pain medication but continues to have pain.  I will consult the medical service for admission for continued treatment of her pancreatitis.  Could consider MRCP to rule out biliary cause for her pancreatitis. Final Clinical Impression(s) / ED Diagnoses Final diagnoses:  Acute pancreatitis, unspecified complication status, unspecified  pancreatitis type     Tomi Bamberger,  Wille Glaser, MD 03/11/21 2108

## 2021-03-11 NOTE — H&P (Signed)
History and Physical   Holly Graham VQM:[id number] DOB: February 06, 1981 DOA: 03/11/2021  PCP: [name], NP   Patient coming from: Home  Chief Complaint: Abdominal pain  HPI: Holly Graham is a 40 y.o. female with medical history significant of DVT and PE, fibroids, anemia presenting with ongoing epigastric pain.  Patient states she has had 3 days of epigastric pain.  Pain has been constant and worsening and is described as a sharp pain.  She also has some associated bloating sensation.  She reports decreased p.o. intake due to decreased appetite and initially having some diarrhea but no bowel movements for the past couple days. Has had nausea and vomiting as well.  She denies fevers, chills, chest pain.  ED Course: Vital signs in the ED significant for heart rate in the 90s to 100s and blood pressure in the 932I 712W systolic.  Lab work-up showed CMP with sodium 133, chloride 95, potassium 2.7, calcium 8.0, T bili 1.4.  CBC within normal limits.  Lipase elevated to 203.  Urinalysis normal.  CT abdomen pelvis showed stranding around the pancreas compatible with acute pancreatitis.  Patient received Dilaudid, morphine, Zofran, 20 mEq IV potassium and a dose of Zofran in the ED.  Review of Systems: As per HPI otherwise all other systems reviewed and are negative.  Past Medical History:  Diagnosis Date   DVT (deep venous thrombosis) ([place])    Fibroids 05/11/2018   History of blood transfusion    Iron deficiency anemia    Pulmonary emboli (HCC)    Bilateral    Past Surgical History:  Procedure Laterality Date   ABDOMINAL HYSTERECTOMY     DILATION AND CURETTAGE OF UTERUS     INTRAVASCULAR ULTRASOUND/IVUS N/A 05/15/2018   Procedure: INTRAVASCULAR ULTRASOUND/IVUS;  Surgeon: Waynetta Sandy, MD;  Location: [facility];  Service: Cardiovascular;  Laterality: N/A;   PERIPHERAL VASCULAR THROMBECTOMY N/A 05/15/2018   Procedure: PERIPHERAL VASCULAR THROMBECTOMY;   Surgeon: Waynetta Sandy, MD;  Location: [place] [facility];  Service: Cardiovascular;  Laterality: N/A;   ROBOTIC ASSISTED TOTAL HYSTERECTOMY WITH BILATERAL SALPINGO OOPHERECTOMY N/A 11/21/2018   Procedure: XI ROBOTIC ASSISTED TOTAL HYSTERECTOMY WITH BILATERAL SALPINGECTOMY GREATER THAN 250 GRAMS;  Surgeon: Everitt Amber, MD;  Location: [facility];  Service: Gynecology;  Laterality: N/A;   TUBAL LIGATION      Social History  reports that she has never smoked. She has never used smokeless tobacco. She reports current alcohol use. She reports that she does not use drugs.  No Known Allergies  Family History  Problem Relation Age of Onset   Pulmonary embolism Brother    Diabetes Maternal Grandmother   Reviewed on admission  Prior to Admission medications   Medication Sig Start Date End Date Taking? Authorizing Provider  acetaminophen (TYLENOL) 500 MG tablet Take 1,000 mg by mouth every 6 (six) hours as needed for mild pain.   Yes [provider]  Aspirin-Acetaminophen-Caffeine (GOODY HEADACHE PO) Take 1 Package by mouth daily as needed (For headache).   Yes [provider]    Physical Exam: Vitals:   03/11/21 1944 03/11/21 2000 03/11/21 2030 03/11/21 2100  BP: (!) 141/97 (!) 142/100 (!) 140/107 (!) 134/95  Pulse: 90 92 99 (!) 105  Resp: (!) 24 (!) 27 (!) 31 18  Temp:      TempSrc:      SpO2: 100% 100% 100% 100%  Weight:      Height:       Physical Exam Constitutional:  General: She is not in acute distress.    Appearance: Normal appearance.  HENT:     Head: Normocephalic and atraumatic.     Mouth/Throat:     Mouth: Mucous membranes are moist.     Pharynx: Oropharynx is clear.  Eyes:     Extraocular Movements: Extraocular movements intact.     Pupils: Pupils are equal, round, and reactive to light.  Cardiovascular:     Rate and Rhythm: Normal rate and regular rhythm.     Pulses: Normal pulses.     Heart sounds: Normal heart sounds.  Pulmonary:      Effort: Pulmonary effort is normal. No respiratory distress.     Breath sounds: Normal breath sounds.  Abdominal:     General: Bowel sounds are normal. There is no distension.     Palpations: Abdomen is soft.     Tenderness: There is abdominal tenderness.  Musculoskeletal:        General: No swelling or deformity.  Skin:    General: Skin is warm and dry.  Neurological:     General: No focal deficit present.     Mental Status: Mental status is at baseline.   Labs on Admission: I have personally reviewed following labs and imaging studies  CBC: Recent Labs  Lab 03/11/21 1546  WBC 6.5  NEUTROABS 4.8  HGB 12.1  HCT 34.3*  MCV 112.5*  PLT 401    Basic Metabolic Panel: Recent Labs  Lab 03/11/21 1546  NA 133*  K 2.7*  CL 95*  CO2 27  GLUCOSE 100*  BUN 7  CREATININE 0.64  CALCIUM 8.0*    GFR: Estimated Creatinine Clearance: 83.6 mL/min (by C-G formula based on SCr of 0.64 mg/dL).  Liver Function Tests: Recent Labs  Lab 03/11/21 1546  AST 34  ALT 15  ALKPHOS 96  BILITOT 1.4*  PROT 7.3  ALBUMIN 3.9    Urine analysis:    Component Value Date/Time   COLORURINE YELLOW 03/11/2021 1540   APPEARANCEUR CLEAR 03/11/2021 1540   LABSPEC <1.005 (L) 03/11/2021 1540   PHURINE 7.0 03/11/2021 1540   GLUCOSEU NEGATIVE 03/11/2021 1540   HGBUR NEGATIVE 03/11/2021 1540   BILIRUBINUR NEGATIVE 03/11/2021 1540   KETONESUR NEGATIVE 03/11/2021 [place] 03/11/2021 1540   UROBILINOGEN >=8.0 03/11/2021 1148   NITRITE NEGATIVE 03/11/2021 [place] 03/11/2021 1540    Radiological Exams on Admission: CT ABDOMEN PELVIS W CONTRAST  Result Date: 03/11/2021 CLINICAL DATA:  Abdominal pain, acute, nonlocalized. Epigastric pain EXAM: CT ABDOMEN AND PELVIS WITH CONTRAST TECHNIQUE: Multidetector CT imaging of the abdomen and pelvis was performed using the standard protocol following bolus administration of intravenous contrast. CONTRAST:  60mL  OMNIPAQUE IOHEXOL 350 MG/ML SOLN COMPARISON:  01/05/2019 FINDINGS: Lower chest: No acute abnormality Hepatobiliary: No focal hepatic abnormality. Gallstones within the gallbladder. No visible wall thickening or biliary ductal dilatation. Pancreas: Stranding around the pancreas compatible with acute pancreatitis. No focal pancreatic lesion or ductal dilatation. Spleen: No focal abnormality.  Normal size. Adrenals/Urinary Tract: No adrenal abnormality. No focal renal abnormality. No stones or hydronephrosis. Urinary bladder is unremarkable. Stomach/Bowel: Normal appendix. Stomach, large and small bowel grossly unremarkable. Vascular/Lymphatic: No evidence of aneurysm or adenopathy. Left common iliac vein stent unchanged. Reproductive: Prior hysterectomy. Left ovarian cyst measuring 2.8 cm. Right ovary unremarkable. Other: None Musculoskeletal: No acute bony abnormality. IMPRESSION: Stranding around the pancreas compatible with acute pancreatitis. Cholelithiasis. Electronically Signed   By: Rolm Baptise M.D.   On:  03/11/2021 18:40    EKG: Not performed in ED.  Assessment/Plan Active Problems:   * No active hospital problems. *  Acute pancreatitis > Patient presenting with 3 days of epigastric pain that has been constant and worsening.  Noted to have evidence consistent with acute pancreatitis on CT despite lipase being only mildly elevated at 203.  No stones noted in the duct however they were noted in the gallbladder on CT.  No heavy alcohol use, normal LFTs other than mild T. Bili elevation at 1.4.  > Patient still with significant pain in the ED despite pain medication. - Monitor on telemetry - Continue with as needed Dilaudid for pain - Continue IV fluids and-n.p.o. overnight  Hypokalemia > Potassium noted to be 2.7 in ED.  20 mEq IV potassium been ordered. - We will order additional 40 mEq IV potassium and add on magnesium - Trend electrolytes  DVT prophylaxis: Lovenox  Code Status:    Full Family Communication:  None admission.  She states her family is up-to-date.  Disposition Plan:   Patient is from:  Home  Anticipated DC to:  Home  Anticipated DC date:  1 to 3 days  Anticipated DC barriers: None  Consults called:  None  Admission status:  Observation, telemetry  Severity of Illness: The appropriate patient status for this patient is OBSERVATION. Observation status is judged to be reasonable and necessary in order to provide the required intensity of service to ensure the patient's safety. The patient's presenting symptoms, physical exam findings, and initial radiographic and laboratory data in the context of their medical condition is felt to place them at decreased risk for further clinical deterioration. Furthermore, it is anticipated that the patient will be medically stable for discharge from the hospital within 2 midnights of admission.    Marcelyn Bruins MD [facility]  How to contact the [facility] Attending or Consulting provider [place] or covering provider during after hours [place], for this patient?   Check the care team in [facility] and look for a) attending/consulting TR[facility] provider listed and b) the [facility] team listed Log into [email or web address] and use [place]'s universal password to access. If you do not have the password, please contact the hospital operator. Locate the [facility] provider you are looking for under [facility] and page to a number that you can be directly reached. If you still have difficulty reaching the provider, please page the [facility] (Director on Call) for the Hospitalists listed on amion for assistance.  03/11/2021, 9:23 PM

## 2021-03-11 NOTE — ED Notes (Signed)
Patient is being discharged from the Urgent Care and sent to the Emergency Department via personal vehicle . Per Provider Alyse Low, patient is in need of higher level of care due to further evaluation of abdominal pain. Patient is aware and verbalizes understanding of plan of care.   Vitals:   03/11/21 1100  BP: (!) 142/98  Pulse: (!) 117  Temp: 97.8 F (36.6 C)  SpO2: 97%

## 2021-03-11 NOTE — ED Triage Notes (Signed)
Pt presents with c/o abdominal pain and back pain for several days.

## 2021-03-11 NOTE — Discharge Instructions (Signed)
Go to the Emergency department for evalatuion

## 2021-03-11 NOTE — ED Provider Notes (Addendum)
[facility]    CSN: [id number] Arrival date & time: 03/11/21  1011      History   Chief Complaint Chief Complaint  Patient presents with   Back Pain   Shortness of Breath   Chest Pain   Abdominal Pain   Weakness   Dizziness   Nausea    HPI Holly Graham is a 40 y.o. female.   Pt complains of back pain and abdominal pain for the past 4 days.  Pt also complains of pain in her back.     The history is provided by the patient. No language interpreter was used.  Back Pain Location:  Generalized Associated symptoms: abdominal pain, chest pain and weakness   Shortness of Breath Associated symptoms: abdominal pain and chest pain   Chest Pain Associated symptoms: abdominal pain, back pain, dizziness, shortness of breath and weakness   Abdominal Pain Pain location:  Generalized Pain quality: aching   Pain radiates to:  Does not radiate Chronicity:  New Associated symptoms: chest pain and shortness of breath   Weakness Associated symptoms: abdominal pain, chest pain, dizziness and shortness of breath   Dizziness Associated symptoms: chest pain, shortness of breath and weakness    Past Medical History:  Diagnosis Date   DVT (deep venous thrombosis) ([place])    Fibroids 05/11/2018   History of blood transfusion    Iron deficiency anemia    Pulmonary emboli (HCC)    Bilateral    Patient Active Problem List   Diagnosis Date Noted   Intramural and submucous leiomyoma of uterus 10/18/2018   Hot flashes due to menopause 09/14/2018   Physical debility 08/15/2018   Iron deficiency anemia due to chronic blood loss 07/05/2018   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   Uterine bleeding 05/31/2018   Nausea with vomiting 05/31/2018   Other constipation 05/23/2018   Weight loss, abnormal 05/23/2018   Abnormal uterine bleeding (AUB) 05/11/2018   Fibroids 05/11/2018   Bilateral pulmonary embolism ([place]) 05/08/2018   Hypokalemia 05/08/2018    Past Surgical History:   Procedure Laterality Date   ABDOMINAL HYSTERECTOMY     DILATION AND CURETTAGE OF UTERUS     INTRAVASCULAR ULTRASOUND/IVUS N/A 05/15/2018   Procedure: INTRAVASCULAR ULTRASOUND/IVUS;  Surgeon: Waynetta Sandy, MD;  Location: [place] [facility];  Service: Cardiovascular;  Laterality: N/A;   PERIPHERAL VASCULAR THROMBECTOMY N/A 05/15/2018   Procedure: PERIPHERAL VASCULAR THROMBECTOMY;  Surgeon: Waynetta Sandy, MD;  Location: [place] [facility];  Service: Cardiovascular;  Laterality: N/A;   ROBOTIC ASSISTED TOTAL HYSTERECTOMY WITH BILATERAL SALPINGO OOPHERECTOMY N/A 11/21/2018   Procedure: XI ROBOTIC ASSISTED TOTAL HYSTERECTOMY WITH BILATERAL SALPINGECTOMY GREATER THAN 250 GRAMS;  Surgeon: Everitt Amber, MD;  Location: [facility];  Service: Gynecology;  Laterality: N/A;   TUBAL LIGATION      OB History     Gravida  4   Para  3   Term  3   Preterm      AB  1   Living  3      SAB  1   IAB  0   Ectopic  0   Multiple  0   Live Births  3            Home Medications    Prior to Admission medications   Medication Sig Start Date End Date Taking? Authorizing Provider  acetaminophen (TYLENOL) 500 MG tablet Take 1,000 mg by mouth every 6 (six) hours as needed for mild pain.    [provider]    Family History Family History  Problem Relation Age of Onset   Pulmonary embolism Brother    Diabetes Maternal Grandmother     Social History Social History   Tobacco Use   Smoking status: Never   Smokeless tobacco: Never  Vaping Use   Vaping Use: Never used  Substance Use Topics   Alcohol use: Yes   Drug use: Never     Allergies   Patient has no known allergies.   Review of Systems Review of Systems  Respiratory:  Positive for shortness of breath.   Cardiovascular:  Positive for chest pain.  Gastrointestinal:  Positive for abdominal pain.  Musculoskeletal:  Positive for back pain.  Neurological:  Positive for dizziness and weakness.   All other systems reviewed and are negative.   Physical Exam Triage Vital Signs ED Triage Vitals  Enc Vitals Group     BP 03/11/21 1100 (!) 142/98     Pulse Rate 03/11/21 1100 (!) 117     Resp --      Temp 03/11/21 1100 97.8 F (36.6 C)     Temp Source 03/11/21 1100 Oral     SpO2 03/11/21 1100 97 %     Weight --      Height --      Head Circumference --      Peak Flow --      Pain Score 03/11/21 1059 8     Pain Loc --      Pain Edu? --      Excl. in [place]? --    No data found.  Updated Vital Signs BP (!) 142/98 (BP Location: Right Arm)    Pulse (!) 117    Temp 97.8 F (36.6 C) (Oral)    LMP 08/21/2018    SpO2 97%   Visual Acuity Right Eye Distance:   Left Eye Distance:   Bilateral Distance:    Right Eye Near:   Left Eye Near:    Bilateral Near:     Physical Exam Vitals reviewed.  Constitutional:      Appearance: She is well-developed.  HENT:     Head: Normocephalic.  Cardiovascular:     Rate and Rhythm: Normal rate and regular rhythm.  Pulmonary:     Effort: Pulmonary effort is normal.  Musculoskeletal:        General: Normal range of motion.     Cervical back: Normal range of motion.  Skin:    General: Skin is warm.  Neurological:     General: No focal deficit present.     Mental Status: She is alert.  Psychiatric:        Mood and Affect: Mood normal.     UC Treatments / Results  Labs (all labs ordered are listed, but only abnormal results are displayed) Labs Reviewed  POCT URINALYSIS DIPSTICK, ED / UC - Abnormal; Notable for the following components:      Result Value   Glucose, UA 100 (*)    Bilirubin Urine LARGE (*)    Ketones, ur 15 (*)    Protein, ur 100 (*)    All other components within normal limits  POC INFLUENZA A AND B ANTIGEN (URGENT CARE ONLY)    EKG   Radiology No results found.  Procedures Procedures (including critical care time)  Medications Ordered in UC Medications - No data to display  Initial Impression /  Assessment and Plan / UC Course  I have reviewed the triage vital signs  and the nursing notes.  Pertinent labs & imaging results that were available during my care of the patient were reviewed by me and considered in my medical decision making (see chart for details).     Ua  reviewed,  Influenza is negative,  Pt seems to have significatn abdominal pain.  Pt advised to go to ED to be seen  Final Clinical Impressions(s) / UC Diagnoses   Final diagnoses:  Generalized abdominal pain     Discharge Instructions      Go to the Emergency department for evalatuion   ED Prescriptions   None    PDMP not reviewed this encounter. An After Visit Summary was printed and given to the patient.    Fransico Meadow, PA-C 03/11/21 1223    Fransico Meadow, [place] 03/11/21 1235

## 2021-03-11 NOTE — Telephone Encounter (Signed)
Reason for Disposition  [1] SEVERE pain (e.g., excruciating) AND [2] present > 1 hour  Answer Assessment - Initial Assessment Questions 1. LOCATION: "Where does it hurt?"      "All over" abdomen 2. RADIATION: "Does the pain shoot anywhere else?" (e.g., chest, back)     back 3. ONSET: "When did the pain begin?" (e.g., minutes, hours or days ago)      Sunday 03/08/21 4. SUDDEN: "Gradual or sudden onset?"     Na  5. PATTERN "Does the pain come and go, or is it constant?"    - If constant: "Is it getting better, staying the same, or worsening?"      (Note: Constant means the pain never goes away completely; most serious pain is constant and it progresses)     - If intermittent: "How long does it last?" "Do you have pain now?"     (Note: Intermittent means the pain goes away completely between bouts)     Constant since yesterday 03/10/21 6. SEVERITY: "How bad is the pain?"  (e.g., Scale 1-10; mild, moderate, or severe)   - MILD (1-3): doesn't interfere with normal activities, abdomen soft and not tender to touch    - MODERATE (4-7): interferes with normal activities or awakens from sleep, abdomen tender to touch    - SEVERE (8-10): excruciating pain, doubled over, unable to do any normal activities      Had to leave work , tender to touch 7. RECURRENT SYMPTOM: "Have you ever had this type of stomach pain before?" If Yes, ask: "When was the last time?" and "What happened that time?"      na 8. CAUSE: "What do you think is causing the stomach pain?"     Not sure but thinks she may be anemic  9. RELIEVING/AGGRAVATING FACTORS: "What makes it better or worse?" (e.g., movement, antacids, bowel movement)     na 10. OTHER SYMPTOMS: "Do you have any other symptoms?" (e.g., back pain, diarrhea, fever, urination pain, vomiting)       na 11. PREGNANCY: "Is there any chance you are pregnant?" "When was your last menstrual period?"       na  Protocols used: Abdominal Pain - [facility]

## 2021-03-11 NOTE — ED Provider Notes (Signed)
Emergency Medicine Provider Triage Evaluation Note  Holly Graham , a 40 y.o. female  was evaluated in triage.  Pt complains of epigastric abdominal pain radiating to the back since Sunday. Endorses pain with deep breathing. Endorses some shortness of breath. Denies dysuria, hematuria, vaginal discharge. Endorses some nausea. No chest pain without deep breathing. Hysterectomy 2 years ago.  Review of Systems  Positive: As above Negative: As above  Physical Exam  BP (!) 129/99 (BP Location: Right Arm)    Pulse (!) 116    Temp 98.9 F (37.2 C) (Oral)    Resp 20    LMP 08/21/2018    SpO2 100%  Gen:   Awake, no distress   Resp:  Normal effort  MSK:   Moves extremities without difficulty  Other:  Very TTP epigastric region  Medical Decision Making  Medically screening exam initiated at 3:39 PM.  Appropriate orders placed.  Holly Graham was informed that the remainder of the evaluation will be completed by another provider, this initial triage assessment does not replace that evaluation, and the importance of remaining in the ED until their evaluation is complete.  Abdominal pain   Dorien Chihuahua 03/11/21 1542    Milton Ferguson, MD 03/11/21 1621

## 2021-03-11 NOTE — ED Triage Notes (Signed)
Pt presents with sob, chest, back and abdominal pain Xs 4 days. States feeling lightheaded, weak and dizzy.

## 2021-03-12 DIAGNOSIS — K851 Biliary acute pancreatitis without necrosis or infection: Secondary | ICD-10-CM | POA: Diagnosis present

## 2021-03-12 DIAGNOSIS — Z86718 Personal history of other venous thrombosis and embolism: Secondary | ICD-10-CM | POA: Diagnosis not present

## 2021-03-12 DIAGNOSIS — K859 Acute pancreatitis without necrosis or infection, unspecified: Secondary | ICD-10-CM | POA: Diagnosis present

## 2021-03-12 DIAGNOSIS — D539 Nutritional anemia, unspecified: Secondary | ICD-10-CM | POA: Diagnosis present

## 2021-03-12 DIAGNOSIS — Z86711 Personal history of pulmonary embolism: Secondary | ICD-10-CM | POA: Diagnosis not present

## 2021-03-12 DIAGNOSIS — Z20822 Contact with and (suspected) exposure to covid-19: Secondary | ICD-10-CM | POA: Diagnosis present

## 2021-03-12 DIAGNOSIS — E876 Hypokalemia: Secondary | ICD-10-CM | POA: Diagnosis present

## 2021-03-12 DIAGNOSIS — K8010 Calculus of gallbladder with chronic cholecystitis without obstruction: Secondary | ICD-10-CM | POA: Diagnosis present

## 2021-03-12 DIAGNOSIS — Z9851 Tubal ligation status: Secondary | ICD-10-CM | POA: Diagnosis not present

## 2021-03-12 DIAGNOSIS — Z90710 Acquired absence of both cervix and uterus: Secondary | ICD-10-CM | POA: Diagnosis not present

## 2021-03-12 LAB — CBC
HCT: 30.7 % — ABNORMAL LOW (ref 36.0–46.0)
Hemoglobin: 10.5 g/dL — ABNORMAL LOW (ref 12.0–15.0)
MCH: 39.2 pg — ABNORMAL HIGH (ref 26.0–34.0)
MCHC: 34.2 g/dL (ref 30.0–36.0)
MCV: 114.6 fL — ABNORMAL HIGH (ref 80.0–100.0)
Platelets: 183 10*3/uL (ref 150–400)
RBC: 2.68 MIL/uL — ABNORMAL LOW (ref 3.87–5.11)
RDW: 14 % (ref 11.5–15.5)
WBC: 6.7 10*3/uL (ref 4.0–10.5)
nRBC: 0 % (ref 0.0–0.2)

## 2021-03-12 LAB — BASIC METABOLIC PANEL
Anion gap: 9 (ref 5–15)
BUN: <5 mg/dL — ABNORMAL LOW (ref 6–20)
CO2: 22 mmol/L (ref 22–32)
Calcium: 7.4 mg/dL — ABNORMAL LOW (ref 8.9–10.3)
Chloride: 102 mmol/L (ref 98–111)
Creatinine, Ser: 0.54 mg/dL (ref 0.44–1.00)
GFR, Estimated: >60 mL/min (ref >60)
Glucose, Bld: 84 mg/dL (ref 70–99)
Potassium: 3.4 mmol/L — ABNORMAL LOW (ref 3.5–5.1)
Sodium: 133 mmol/L — ABNORMAL LOW (ref 135–145)

## 2021-03-12 LAB — COMPREHENSIVE METABOLIC PANEL
ALT: 12 U/L (ref 0–44)
AST: 25 U/L (ref 15–41)
Albumin: 3.3 g/dL — ABNORMAL LOW (ref 3.5–5.0)
Alkaline Phosphatase: 93 U/L (ref 38–126)
Anion gap: 10 (ref 5–15)
BUN: 6 mg/dL (ref 6–20)
CO2: 25 mmol/L (ref 22–32)
Calcium: 7.3 mg/dL — ABNORMAL LOW (ref 8.9–10.3)
Chloride: 98 mmol/L (ref 98–111)
Creatinine, Ser: 0.62 mg/dL (ref 0.44–1.00)
GFR, Estimated: >60 mL/min (ref >60)
Glucose, Bld: 90 mg/dL (ref 70–99)
Potassium: 3.1 mmol/L — ABNORMAL LOW (ref 3.5–5.1)
Sodium: 133 mmol/L — ABNORMAL LOW (ref 135–145)
Total Bilirubin: 2 mg/dL — ABNORMAL HIGH (ref 0.3–1.2)
Total Protein: 6.4 g/dL — ABNORMAL LOW (ref 6.5–8.1)

## 2021-03-12 LAB — MAGNESIUM
Magnesium: 1.1 mg/dL — ABNORMAL LOW (ref 1.7–2.4)
Magnesium: 1.9 mg/dL (ref 1.7–2.4)

## 2021-03-12 LAB — TRIGLYCERIDES: Triglycerides: 96 mg/dL (ref <150)

## 2021-03-12 LAB — SURGICAL PCR SCREEN
MRSA, PCR: NEGATIVE
Staphylococcus aureus: NEGATIVE

## 2021-03-12 LAB — HIV ANTIBODY (ROUTINE TESTING W REFLEX): HIV Screen 4th Generation wRfx: NONREACTIVE

## 2021-03-12 MED ORDER — DOCUSATE SODIUM 100 MG PO CAPS
100.0000 mg | ORAL_CAPSULE | Freq: Two times a day (BID) | ORAL | Status: DC
  Administered 2021-03-12: 100 mg via ORAL
  Filled 2021-03-12: qty 1

## 2021-03-12 MED ORDER — POTASSIUM CHLORIDE 10 MEQ/100ML IV SOLN
10.0000 meq | INTRAVENOUS | Status: AC
  Administered 2021-03-12 (×4): 10 meq via INTRAVENOUS
  Filled 2021-03-12 (×4): qty 100

## 2021-03-12 MED ORDER — MUPIROCIN 2 % EX OINT
1.0000 "application " | TOPICAL_OINTMENT | Freq: Two times a day (BID) | CUTANEOUS | Status: DC
  Administered 2021-03-13 – 2021-03-15 (×4): 1 via NASAL
  Filled 2021-03-12 (×3): qty 22

## 2021-03-12 MED ORDER — ONDANSETRON HCL 4 MG PO TABS: 4.0000 mg | ORAL_TABLET | Freq: Four times a day (QID) | ORAL | Status: DC | PRN

## 2021-03-12 MED ORDER — MAGNESIUM SULFATE 2 GM/50ML IV SOLN
2.0000 g | Freq: Once | INTRAVENOUS | Status: AC
  Administered 2021-03-12: 2 g via INTRAVENOUS
  Filled 2021-03-12: qty 50

## 2021-03-12 MED ORDER — OXYCODONE HCL 5 MG PO TABS
5.0000 mg | ORAL_TABLET | ORAL | Status: DC | PRN
  Administered 2021-03-12 – 2021-03-15 (×6): 5 mg via ORAL
  Filled 2021-03-12 (×6): qty 1

## 2021-03-12 MED ORDER — ONDANSETRON HCL 4 MG/2ML IJ SOLN
4.0000 mg | Freq: Four times a day (QID) | INTRAMUSCULAR | Status: DC | PRN
  Administered 2021-03-13 (×2): 4 mg via INTRAVENOUS
  Filled 2021-03-12 (×2): qty 2

## 2021-03-12 NOTE — Assessment & Plan Note (Addendum)
Hemoglobin currently stable.  But on the lower side. MCV more than 100. H46 low normal.  Folic acid and TSH normal.  We will supplement with B12.

## 2021-03-12 NOTE — Assessment & Plan Note (Addendum)
MRCP negative for any CBD stone. Continue with IV hydration.

## 2021-03-12 NOTE — Consult Note (Signed)
° ° ° °Holly Graham °10/05/1980  °[id number].   ° °Requesting MD: Dr. Pranav Patel °Chief Complaint/Reason for Consult: Gallstone Pancreatitis ° °HPI: Holly Graham is a 40 y.o. 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  Patient reports on Sunday she began having epigastric abdominal pain with radiation to her back.  Throughout the week her pain worsened and was associated with multiple episodes of nausea and vomiting.  She notes her symptoms were worsened by p.o. intake.  She denies history of similar symptoms in the past.  She went to urgent care and was directed to the ED.  She underwent work-up that was consistent with gallstone pancreatitis.  She was admitted to [facility].  We were asked to see.  Today her pain is improved from 8/10 on admission to a 6/10.  She is no longer nausated and has not had any emesis today. She is currently npo. She denies known history of gallstones.  She reports she drinks up to two shots 3x/week with the last etoh intake last Thursday. Denies binge drinking. Prior abdominal surgeries include TAH.  ° °Work-up °WBC 6.5 > 6.7 °Lipase 204 °Alk Phose 96 > 93 °AST 34 > 25  °ALT 15 > 12 °T. Bili 1.4 > 2.0 °TG: Pending °CT A/P: Acute pancreatitis without mention of abscess or necrosis. Cholelithiasis noted without biliary ductal dilation or evidence of acute cholecystitis ° °Review of Systems  °Constitutional:  Negative for chills and fever.  °Gastrointestinal:  Positive for abdominal pain, diarrhea, nausea and vomiting.  °All other systems reviewed and are negative. ° °Family History  °Problem Relation Age of Onset  ° Pulmonary embolism Brother   ° Diabetes Maternal Grandmother   ° ° °Past Medical History:  °Diagnosis Date  ° DVT (deep venous thrombosis) (HCC)   ° Fibroids 05/11/2018  ° History of blood transfusion   ° Iron deficiency anemia   ° Pulmonary emboli (HCC)   °  Bilateral  ° ° °Past Surgical History:  °Procedure Laterality Date  ° ABDOMINAL HYSTERECTOMY    ° DILATION AND CURETTAGE OF UTERUS    ° INTRAVASCULAR ULTRASOUND/IVUS N/A 05/15/2018  ° Procedure: INTRAVASCULAR ULTRASOUND/IVUS;  Surgeon: Cain, Brandon Christopher, MD;  Location: [facility];  Service: Cardiovascular;  Laterality: N/A;  ° PERIPHERAL VASCULAR THROMBECTOMY N/A 05/15/2018  ° Procedure: PERIPHERAL VASCULAR THROMBECTOMY;  Surgeon: Cain, Brandon Christopher, MD;  Location: [facility];  Service: Cardiovascular;  Laterality: N/A;  ° ROBOTIC ASSISTED TOTAL HYSTERECTOMY WITH BILATERAL SALPINGO OOPHERECTOMY N/A 11/21/2018  ° Procedure: XI ROBOTIC ASSISTED TOTAL HYSTERECTOMY WITH BILATERAL SALPINGECTOMY GREATER THAN 250 GRAMS;  Surgeon: Rossi, Emma, MD;  Location: [facility];  Service: Gynecology;  Laterality: N/A;  ° TUBAL LIGATION    ° ° °Social History:  reports that she has never smoked. She has never used smokeless tobacco. She reports current alcohol use. She reports that she does not use drugs. °Patient lives at home with her 15 and 17 year old children °Alcohol use as above °She denies tobacco or illicit drug use °She is a social worker ° °Allergies: No Known Allergies ° °(Not in a hospital admission) ° ° ° °Physical Exam: °Blood pressure (!) 138/97, pulse 84, temperature 98.9 °F (37.2 °C), temperature source Oral, resp. rate 18, height 5' 3" (1.6 m), weight 64.4 kg, last menstrual period 08/21/2018, SpO2 100 %. °General: pleasant, WD/WN AA female who is laying   in bed in NAD °HEENT: head is normocephalic, atraumatic.  Sclera are noninjected.  PERRL.  Ears and nose without any masses or lesions.  Mouth is pink and moist. Dentition fair °Heart: regular, rate, and rhythm.  Normal s1,s2. No obvious murmurs, gallops, or rubs noted.  Palpable pedal pulses bilaterally  °Lungs: CTAB, no wheezes, rhonchi, or rales noted.  Respiratory effort nonlabored °Abd:  Soft, ND, epigastric tenderness without  peritonitis, +BS, no masses, hernias, or organomegaly °MS: no BUE/BLE edema, calves soft and nontender °Skin: warm and dry with no masses, lesions, or rashes °Psych: A&Ox4 with an appropriate affect °Neuro: cranial nerves grossly intact, equal strength in BUE/BLE bilaterally, normal speech, thought process intact, moves all extremities, gait not assessed ° ° °Results for orders placed or performed during the hospital encounter of 03/11/21 (from the past 48 hour(s))  °Urinalysis, Routine w reflex microscopic Urine, Clean Catch     Status: Abnormal  ° Collection Time: 03/11/21  3:40 PM  °Result Value Ref Range  ° Color, Urine YELLOW YELLOW  ° APPearance CLEAR CLEAR  ° Specific Gravity, Urine <1.005 (L) 1.005 - 1.030  ° pH 7.0 5.0 - 8.0  ° Glucose, UA NEGATIVE NEGATIVE mg/dL  ° Hgb urine dipstick NEGATIVE NEGATIVE  ° Bilirubin Urine NEGATIVE NEGATIVE  ° Ketones, ur NEGATIVE NEGATIVE mg/dL  ° Protein, ur NEGATIVE NEGATIVE mg/dL  ° Nitrite NEGATIVE NEGATIVE  ° Leukocytes,Ua NEGATIVE NEGATIVE  °  Comment: Microscopic not done on urines with negative protein, blood, leukocytes, nitrite, or glucose < 500 mg/dL. °Performed at [facility], [address] [place], [place] [address] °  °CBC with Differential     Status: Abnormal  ° Collection Time: 03/11/21  3:46 PM  °Result Value Ref Range  ° WBC 6.5 4.0 - 10.5 K/uL  ° RBC 3.05 (L) 3.87 - 5.11 MIL/uL  ° Hemoglobin 12.1 12.0 - 15.0 g/dL  ° HCT 34.3 (L) 36.0 - 46.0 %  ° MCV 112.5 (H) 80.0 - 100.0 fL  ° MCH 39.7 (H) 26.0 - 34.0 pg  ° MCHC 35.3 30.0 - 36.0 g/dL  ° RDW 13.8 11.5 - 15.5 %  ° Platelets 176 150 - 400 K/uL  ° nRBC 0.0 0.0 - 0.2 %  ° Neutrophils Relative % 72 %  ° Neutro Abs 4.8 1.7 - 7.7 K/uL  ° Lymphocytes Relative 17 %  ° Lymphs Abs 1.1 0.7 - 4.0 K/uL  ° Monocytes Relative 9 %  ° Monocytes Absolute 0.6 0.1 - 1.0 K/uL  ° Eosinophils Relative 1 %  ° Eosinophils Absolute 0.0 0.0 - 0.5 K/uL  ° Basophils Relative 0 %  ° Basophils Absolute 0.0 0.0 -  0.1 K/uL  ° Immature Granulocytes 1 %  ° Abs Immature Granulocytes 0.03 0.00 - 0.07 K/uL  °  Comment: Performed at [facility], [address] [place], [place] [address]  °Comprehensive metabolic panel     Status: Abnormal  ° Collection Time: 03/11/21  3:46 PM  °Result Value Ref Range  ° Sodium 133 (L) 135 - 145 mmol/L  ° Potassium 2.7 (LL) 3.5 - 5.1 mmol/L  °  Comment: REPEATED TO VERIFY °CRITICAL RESULT CALLED TO, READ BACK BY AND VERIFIED WITH: °BRATU,D. EMTP AT 1651 03/11/21 MULLINS,T °  ° Chloride 95 (L) 98 - 111 mmol/L  ° CO2 27 22 - 32 mmol/L  ° Glucose, Bld 100 (H) 70 - 99 mg/dL  °  Comment: Glucose reference range applies only to samples taken after   after fasting for at least 8 hours.   BUN 7 6 - 20 mg/dL   Creatinine, Ser 0.64 0.44 - 1.00 mg/dL   Calcium 8.0 (L) 8.9 - 10.3 mg/dL   Total Protein 7.3 6.5 - 8.1 g/dL   Albumin 3.9 3.5 - 5.0 g/dL   AST 34 15 - 41 U/L   ALT 15 0 - 44 U/L   Alkaline Phosphatase 96 38 - 126 U/L   Total Bilirubin 1.4 (H) 0.3 - 1.2 mg/dL   GFR, Estimated >60 >60 mL/min    Comment: (NOTE) Calculated using the CKD-EPI Creatinine Equation (2021)    Anion gap 11 5 - 15    Comment: Performed at [facility], [place] [address] [place], [place] [address]  Lipase, blood     Status: Abnormal   Collection Time: 03/11/21  3:46 PM  Result Value Ref Range   Lipase 204 (H) 11 - 51 U/L    Comment: Performed at [facility], [place] [address], [place], [place] [address]  Resp Panel by RT-PCR (Flu A&B, Covid) Nasopharyngeal Swab     Status: None   Collection Time: 03/11/21  9:06 PM   Specimen: Nasopharyngeal Swab; Nasopharyngeal(NP) swabs in vial transport medium  Result Value Ref Range   SARS Coronavirus 2 by RT PCR NEGATIVE NEGATIVE    Comment: (NOTE) SARS-CoV-2 target nucleic acids are NOT DETECTED.  The SARS-CoV-2 RNA is generally detectable in upper respiratory specimens during the acute phase of infection. The  lowest concentration of SARS-CoV-2 viral copies this assay can detect is 138 copies/mL. A negative result does not preclude SARS-Cov-2 infection and should not be used as the sole basis for treatment or other patient management decisions. A negative result may occur with  improper specimen collection/handling, submission of specimen other than nasopharyngeal swab, presence of viral mutation(s) within the areas targeted by this assay, and inadequate number of viral copies(<138 copies/mL). A negative result must be combined with clinical observations, patient history, and epidemiological information. The expected result is Negative.  Fact Sheet for Patients:  EntrepreneurPulse.com.au  Fact Sheet for Healthcare Providers:  IncredibleEmployment.be  This test is no t yet approved or cleared by the [place] FDA and  has been authorized for detection and/or diagnosis of SARS-CoV-2 by FDA under an Emergency Use Authorization (EUA). This EUA will remain  in effect (meaning this test can be used) for the duration of the COVID-19 declaration under Section 564(b)(1) of the Act, 21 U.S.C.section 360bbb-3(b)(1), unless the authorization is terminated  or revoked sooner.       Influenza A by PCR NEGATIVE NEGATIVE   Influenza B by PCR NEGATIVE NEGATIVE    Comment: (NOTE) The Xpert Xpress SARS-CoV-2/FLU/RSV plus assay is intended as an aid in the diagnosis of influenza from Nasopharyngeal swab specimens and should not be used as a sole basis for treatment. Nasal washings and aspirates are unacceptable for Xpert Xpress SARS-CoV-2/FLU/RSV testing.  Fact Sheet for Patients: EntrepreneurPulse.com.au  Fact Sheet for Healthcare Providers: IncredibleEmployment.be  This test is not yet approved or cleared by the [place] FDA and has been authorized for detection and/or diagnosis of SARS-CoV-2 by FDA under an Emergency  Use Authorization (EUA). This EUA will remain in effect (meaning this test can be used) for the duration of the COVID-19 declaration under Section 564(b)(1) of the Act, 21 U.S.C. section 360bbb-3(b)(1), unless the authorization is terminated or revoked.  Performed at [facility], [place] [address] [place], [place] [address]  Magnesium     Status: Abnormal  ° Collection Time: 03/12/21  5:03 AM  °Result Value Ref Range  ° Magnesium 1.1 (L) 1.7 - 2.4 mg/dL  °  Comment: Performed at [facility], [address] [place], [place] [address]  °HIV Antibody (routine testing w rflx)     Status: None  ° Collection Time: 03/12/21  5:03 AM  °Result Value Ref Range  ° HIV Screen 4th Generation wRfx Non Reactive Non Reactive  °  Comment: Performed at [facility], [address] [place], [place] [address]  °Comprehensive metabolic panel     Status: Abnormal  ° Collection Time: 03/12/21  5:03 AM  °Result Value Ref Range  ° Sodium 133 (L) 135 - 145 mmol/L  ° Potassium 3.1 (L) 3.5 - 5.1 mmol/L  ° Chloride 98 98 - 111 mmol/L  ° CO2 25 22 - 32 mmol/L  ° Glucose, Bld 90 70 - 99 mg/dL  °  Comment: Glucose reference range applies only to samples taken after fasting for at least 8 hours.  ° BUN 6 6 - 20 mg/dL  ° Creatinine, Ser 0.62 0.44 - 1.00 mg/dL  ° Calcium 7.3 (L) 8.9 - 10.3 mg/dL  ° Total Protein 6.4 (L) 6.5 - 8.1 g/dL  ° Albumin 3.3 (L) 3.5 - 5.0 g/dL  ° AST 25 15 - 41 U/L  ° ALT 12 0 - 44 U/L  ° Alkaline Phosphatase 93 38 - 126 U/L  ° Total Bilirubin 2.0 (H) 0.3 - 1.2 mg/dL  ° GFR, Estimated >60 >60 mL/min  °  Comment: (NOTE) °Calculated using the CKD-EPI Creatinine Equation (2021) °  ° Anion gap 10 5 - 15  °  Comment: Performed at [facility], [address] [place], [place] [address]  °CBC     Status: Abnormal  ° Collection Time: 03/12/21  5:03 AM  °Result Value Ref Range  ° WBC 6.7 4.0 - 10.5 K/uL  ° RBC 2.68 (L) 3.87 - 5.11 MIL/uL  ° Hemoglobin 10.5 (L)  12.0 - 15.0 g/dL  ° HCT 30.7 (L) 36.0 - 46.0 %  ° MCV 114.6 (H) 80.0 - 100.0 fL  ° MCH 39.2 (H) 26.0 - 34.0 pg  ° MCHC 34.2 30.0 - 36.0 g/dL  ° RDW 14.0 11.5 - 15.5 %  ° Platelets 183 150 - 400 K/uL  ° nRBC 0.0 0.0 - 0.2 %  °  Comment: Performed at [facility], [address] , [place] [address]  ° °CT ABDOMEN PELVIS W CONTRAST ° °Result Date: 03/11/2021 °CLINICAL DATA:  Abdominal pain, acute, nonlocalized. Epigastric pain EXAM: CT ABDOMEN AND PELVIS WITH CONTRAST TECHNIQUE: Multidetector CT imaging of the abdomen and pelvis was performed using the standard protocol following bolus administration of intravenous contrast. CONTRAST:  80mL OMNIPAQUE IOHEXOL 350 MG/ML SOLN COMPARISON:  01/05/2019 FINDINGS: Lower chest: No acute abnormality Hepatobiliary: No focal hepatic abnormality. Gallstones within the gallbladder. No visible wall thickening or biliary ductal dilatation. Pancreas: Stranding around the pancreas compatible with acute pancreatitis. No focal pancreatic lesion or ductal dilatation. Spleen: No focal abnormality.  Normal size. Adrenals/Urinary Tract: No adrenal abnormality. No focal renal abnormality. No stones or hydronephrosis. Urinary bladder is unremarkable. Stomach/Bowel: Normal appendix. Stomach, large and small bowel grossly unremarkable. Vascular/Lymphatic: No evidence of aneurysm or adenopathy. Left common iliac vein stent unchanged. Reproductive: Prior hysterectomy. Left ovarian cyst measuring 2.8 cm. Right ovary unremarkable. Other: None Musculoskeletal: No acute bony abnormality. IMPRESSION: Stranding   around the pancreas compatible with acute pancreatitis. Cholelithiasis. Electronically Signed   By: Kevin  Dover M.D.   On: 03/11/2021 18:40   ° °Anti-infectives (From admission, onward)  ° ° None  ° °  ° ° °Assessment/Plan °Gallstone Pancreatitis °-Patient with likely gallstone pancreatitis.  No significant alcohol use.  Triglycerides pending.  On CT there is no  evidence of biliary ductal dilation.  T bili slightly elevated from 1.4 > 2.0 but LFTs otherwise wnl.  Plan to repeat labs in the morning and if LFT's uptrending she may need MRCP to rule out choledocholithiasis.  Otherwise may benefit from IOC.  Recommended patient undergo laparoscopic cholecystectomy +/- IOC after her pancreatitis resolves.  We discussed the indications for laparoscopic cholecystectomy including the high rate risk of recurrence. We will follow with you. ° °FEN - okay for cld from our standpoint. 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

## 2021-03-12 NOTE — Assessment & Plan Note (Addendum)
Being replaced. Monitor daily.

## 2021-03-12 NOTE — Assessment & Plan Note (Addendum)
Presents with 5-day history of abdominal pain radiating to her back with nausea and vomiting. CT scan shows evidence of pancreatitis lipase level elevated LFT normal. CT scan shows evidence of evidence of cholelithiasis without CBD dilation as well. Most likely this is gallstone pancreatitis. Patient denies any alcohol abuse, drug abuse track recently was normal.  General surgery consulted.  Appreciate assistance.  Scheduled for lap chole tomorrow. MRCP negative for any CBD stone. Continue with IV fluid, n.p.o. status after midnight, IV pain medication, IV nausea medication.

## 2021-03-12 NOTE — Progress Notes (Signed)
[facility] Progress Note  Patient: Holly Graham    HYW:[id number]  DOA: 03/11/2021    Date of Service: the patient was seen and examined on 03/12/2021  Brief hospital course: No notes on file  Assessment and Plan: * Acute gallstone pancreatitis Presents with 5-day history of abdominal pain radiating to her back with nausea and vomiting. CT scan shows evidence of pancreatitis lipase level elevated LFT normal. CT scan shows evidence of evidence of cholelithiasis without CBD dilation as well. Most likely this is gallstone pancreatitis. Patient denies any alcohol abuse, drug abuse track recently was normal.  General surgery consulted.  Appreciate assistance. Continue with IV fluid, n.p.o. status, IV pain medication, IV nausea medication.  Hyperbilirubinemia Will monitor LFT and get further work-up.  Hypokalemia Severely low.  Replacing with IV.   Hypomagnesemia Replacing with IV.  Macrocytic anemia Hemoglobin currently stable.  But on the lower side. MCV more than 100. Will check S85 folic acid and TSH level.    Body mass index is 25.15 kg/m.        Subjective: Continues to have some abdominal pain.  No nausea no vomiting.  Passing gas but no BM for last 2 days.  Objective: Blood pressure elevated.  Heart rate elevated.  Exam: General: Appear in mild distress, no Rash; Oral Mucosa Clear, moist. no Abnormal Neck Mass Or lumps, Conjunctiva normal  Cardiovascular: S1 and S2 Present, no Murmur, Respiratory: good respiratory effort, Bilateral Air entry present and CTA, no Crackles, no wheezes Abdomen: Bowel Sound present, Soft and mild tenderness Extremities: no Pedal edema Neurology: alert and oriented to time, place, and person affect appropriate. no new focal deficit Gait not checked due to patient safety concerns    Data Reviewed: Bilirubin level elevated.  Disposition:  Status is: Inpatient  Remains inpatient appropriate because: Ongoing treatment for  pancreatitis, remains NPO.  Family Communication: None at bedside.  DVT Prophylaxis: enoxaparin (LOVENOX) injection 40 mg Start: 03/11/21 2200   Time spent: 35 minutes.   Author: Berle Mull  03/12/2021 5:55 PM  To reach On-call, see care teams to locate the attending and reach out via [email or web address]. Between 7PM-7AM, please contact night-coverage If you still have difficulty reaching the attending provider, please page the [facility] (Director on Call) for [facility] on amion for assistance.

## 2021-03-12 NOTE — Assessment & Plan Note (Addendum)
Replaced. °

## 2021-03-12 NOTE — Plan of Care (Signed)
PT ambulating independently in room from ED, complains of mild pain, pt and I discussed plan of care and questions were answered. Will continue to monitor.

## 2021-03-13 ENCOUNTER — Inpatient Hospital Stay (HOSPITAL_COMMUNITY): Payer: BC Managed Care – PPO

## 2021-03-13 LAB — COMPREHENSIVE METABOLIC PANEL
ALT: 11 U/L (ref 0–44)
AST: 32 U/L (ref 15–41)
Albumin: 3.7 g/dL (ref 3.5–5.0)
Alkaline Phosphatase: 139 U/L — ABNORMAL HIGH (ref 38–126)
Anion gap: 12 (ref 5–15)
BUN: <5 mg/dL — ABNORMAL LOW (ref 6–20)
CO2: 23 mmol/L (ref 22–32)
Calcium: 8.3 mg/dL — ABNORMAL LOW (ref 8.9–10.3)
Chloride: 98 mmol/L (ref 98–111)
Creatinine, Ser: 0.46 mg/dL (ref 0.44–1.00)
GFR, Estimated: >60 mL/min (ref >60)
Glucose, Bld: 77 mg/dL (ref 70–99)
Potassium: 3.6 mmol/L (ref 3.5–5.1)
Sodium: 133 mmol/L — ABNORMAL LOW (ref 135–145)
Total Bilirubin: 2.9 mg/dL — ABNORMAL HIGH (ref 0.3–1.2)
Total Protein: 7.2 g/dL (ref 6.5–8.1)

## 2021-03-13 LAB — CBC
HCT: 32.7 % — ABNORMAL LOW (ref 36.0–46.0)
Hemoglobin: 11.5 g/dL — ABNORMAL LOW (ref 12.0–15.0)
MCH: 39.7 pg — ABNORMAL HIGH (ref 26.0–34.0)
MCHC: 35.2 g/dL (ref 30.0–36.0)
MCV: 112.8 fL — ABNORMAL HIGH (ref 80.0–100.0)
Platelets: 217 10*3/uL (ref 150–400)
RBC: 2.9 MIL/uL — ABNORMAL LOW (ref 3.87–5.11)
RDW: 13.4 % (ref 11.5–15.5)
WBC: 7.2 10*3/uL (ref 4.0–10.5)
nRBC: 0 % (ref 0.0–0.2)

## 2021-03-13 LAB — IRON AND TIBC
Iron: 28 ug/dL (ref 28–170)
Saturation Ratios: 13 % (ref 10.4–31.8)
TIBC: 224 ug/dL — ABNORMAL LOW (ref 250–450)
UIBC: 196 ug/dL

## 2021-03-13 LAB — LIPASE, BLOOD: Lipase: 97 U/L — ABNORMAL HIGH (ref 11–51)

## 2021-03-13 LAB — BILIRUBIN, DIRECT: Bilirubin, Direct: 1.1 mg/dL — ABNORMAL HIGH (ref 0.0–0.2)

## 2021-03-13 LAB — T4, FREE: Free T4: 1.25 ng/dL — ABNORMAL HIGH (ref 0.61–1.12)

## 2021-03-13 LAB — TSH: TSH: 0.916 u[IU]/mL (ref 0.350–4.500)

## 2021-03-13 LAB — VITAMIN B12: Vitamin B-12: 483 pg/mL (ref 180–914)

## 2021-03-13 LAB — FERRITIN: Ferritin: 277 ng/mL (ref 11–307)

## 2021-03-13 MED ORDER — SODIUM CHLORIDE 0.9 % IV SOLN
1.0000 g | INTRAVENOUS | Status: AC
  Administered 2021-03-14: 1 g via INTRAVENOUS
  Filled 2021-03-13: qty 10

## 2021-03-13 MED ORDER — INDOCYANINE GREEN 25 MG IV SOLR
1.0000 mg | INTRAVENOUS | Status: DC
  Filled 2021-03-13: qty 10

## 2021-03-13 MED ORDER — GADOBUTROL 1 MMOL/ML IV SOLN
7.0000 mL | Freq: Once | INTRAVENOUS | Status: AC | PRN
  Administered 2021-03-13: 7 mL via INTRAVENOUS

## 2021-03-13 NOTE — Hospital Course (Addendum)
12/14 presented with abdominal pain found to have pancreatitis. 12/15 General surgery consulted. 12/16 MRCP negative.   12/17 underwent lap chole

## 2021-03-13 NOTE — Progress Notes (Signed)
° °  Subjective/Chief Complaint: Feels better  More bloating today    Objective: Vital signs in last 24 hours: Temp:  [97.5 F (36.4 C)-98.3 F (36.8 C)] 97.5 F (36.4 C) (12/16 0455) Pulse Rate:  [77-103] 88 (12/16 0455) Resp:  [16-20] 18 (12/16 0455) BP: (122-169)/(85-118) 151/99 (12/16 0455) SpO2:  [94 %-100 %] 100 % (12/16 0455) Weight:  [68.8 kg] 68.8 kg (12/16 0500) Last BM Date: 03/10/21  Intake/Output from previous day: 12/15 0701 - 12/16 0700 In: 3526.1 [I.V.:3057.9; IV Piggyback:468.3] Out: 0  Intake/Output this shift: No intake/output data recorded.  General appearance: alert and cooperative Resp: clear to auscultation bilaterally Cardio: regular rate and rhythm and no S3 or S4 GI: epigastric tender ness noted Skin: Skin color, texture, turgor normal. No rashes or lesions  Lab Results:  Recent Labs    03/12/21 0503 03/13/21 0432  WBC 6.7 7.2  HGB 10.5* 11.5*  HCT 30.7* 32.7*  PLT 183 217   BMET Recent Labs    03/12/21 1403 03/13/21 0432  NA 133* 133*  K 3.4* 3.6  CL 102 98  CO2 22 23  GLUCOSE 84 77  BUN <5* <5*  CREATININE 0.54 0.46  CALCIUM 7.4* 8.3*   PT/INR No results for input(s): LABPROT, INR in the last 72 hours. ABG No results for input(s): PHART, HCO3 in the last 72 hours.  Invalid input(s): PCO2, PO2  Studies/Results: CT ABDOMEN PELVIS W CONTRAST  Result Date: 03/11/2021 CLINICAL DATA:  Abdominal pain, acute, nonlocalized. Epigastric pain EXAM: CT ABDOMEN AND PELVIS WITH CONTRAST TECHNIQUE: Multidetector CT imaging of the abdomen and pelvis was performed using the standard protocol following bolus administration of intravenous contrast. CONTRAST:  42mL OMNIPAQUE IOHEXOL 350 MG/ML SOLN COMPARISON:  01/05/2019 FINDINGS: Lower chest: No acute abnormality Hepatobiliary: No focal hepatic abnormality. Gallstones within the gallbladder. No visible wall thickening or biliary ductal dilatation. Pancreas: Stranding around the pancreas  compatible with acute pancreatitis. No focal pancreatic lesion or ductal dilatation. Spleen: No focal abnormality.  Normal size. Adrenals/Urinary Tract: No adrenal abnormality. No focal renal abnormality. No stones or hydronephrosis. Urinary bladder is unremarkable. Stomach/Bowel: Normal appendix. Stomach, large and small bowel grossly unremarkable. Vascular/Lymphatic: No evidence of aneurysm or adenopathy. Left common iliac vein stent unchanged. Reproductive: Prior hysterectomy. Left ovarian cyst measuring 2.8 cm. Right ovary unremarkable. Other: None Musculoskeletal: No acute bony abnormality. IMPRESSION: Stranding around the pancreas compatible with acute pancreatitis. Cholelithiasis. Electronically Signed   By: Rolm Baptise M.D.   On: 03/11/2021 18:40    Anti-infectives: Anti-infectives (From admission, onward)    None       Assessment/Plan: Gallstone pancreatitis   Elvated LFT's   Needs MRCP LGB over the weekend  [facility] to have clears       LOS: 1 day    Marcello Moores A Keandria Berrocal 03/13/2021

## 2021-03-13 NOTE — Progress Notes (Signed)
°  Transition of Care (TOC) Screening Note   Patient Details  Name: Holly Graham Date of Birth: March 05, 1981   Transition of Care [facility]) CM/SW Contact:    Dessa Phi, RN Phone Number: 03/13/2021, 9:34 AM    Transition of Care Department [facility]) has reviewed patient and no TOC needs have been identified at this time. We will continue to monitor patient advancement through interdisciplinary progression rounds. If new patient transition needs arise, please place a TOC consult.

## 2021-03-13 NOTE — Progress Notes (Signed)
[facility] Progress Note  Patient: Holly Graham    LTJ:[id number]  DOA: 03/11/2021    Date of Service: the patient was seen and examined on 03/13/2021  Brief hospital course: 12/14 presented with abdominal pain found to have pancreatitis. 12/15 General surgery consulted. 12/16 MRCP negative.  Currently scheduled for lap chole on 12/17.   Assessment and Plan: * Acute gallstone pancreatitis Presents with 5-day history of abdominal pain radiating to her back with nausea and vomiting. CT scan shows evidence of pancreatitis lipase level elevated LFT normal. CT scan shows evidence of evidence of cholelithiasis without CBD dilation as well. Most likely this is gallstone pancreatitis. Patient denies any alcohol abuse, drug abuse track recently was normal.  General surgery consulted.  Appreciate assistance.  Scheduled for lap chole tomorrow. MRCP negative for any CBD stone. Continue with IV fluid, n.p.o. status after midnight, IV pain medication, IV nausea medication.  Hyperbilirubinemia MRCP negative for any CBD stone. Continue with IV hydration.  Hypokalemia Being replaced. Monitor daily.   Hypomagnesemia Replaced.  Macrocytic anemia Hemoglobin currently stable.  But on the lower side. MCV more than 100. Q76 low normal.  Folic acid and TSH normal.  We will supplement with B12.    Body mass index is 26.87 kg/m.        Subjective: No nausea no vomiting.  Passing gas.  Had a bowel movement.  No fever no chills.  Abdominal pain improving but still present.  Objective: Vital signs were reviewed and unremarkable.  Exam: General: Appear in mild distress, no Rash; Oral Mucosa Clear, moist. no Abnormal Neck Mass Or lumps, Conjunctiva normal  Cardiovascular: S1 and S2 Present, no Murmur, Respiratory: good respiratory effort, Bilateral Air entry present and CTA, no Crackles, no wheezes Abdomen: Bowel Sound present, Soft and diffuse abdominal tenderness Extremities: no  Pedal edema Neurology: alert and oriented to time, place, and person affect appropriate. no new focal deficit Gait not checked due to patient safety concerns    Data Reviewed: My review of labs, imaging, notes and other tests is significant for Hypokalemia and hyperbilirubinemia    Disposition:  Status is: Inpatient  Remains inpatient appropriate because: Need for further work-up and treatment including surgery tomorrow.   Family Communication: None at bedside  DVT Prophylaxis: enoxaparin (LOVENOX) injection 40 mg Start: 03/11/21 2200   Time spent: 35 minutes.   Author: Berle Mull  03/13/2021 8:15 PM  To reach On-call, see care teams to locate the attending and reach out via [email or web address]. Between 7PM-7AM, please contact night-coverage If you still have difficulty reaching the attending provider, please page the [facility] (Director on Call) for [facility] on amion for assistance.

## 2021-03-14 ENCOUNTER — Inpatient Hospital Stay (HOSPITAL_COMMUNITY): Payer: BC Managed Care – PPO | Admitting: Anesthesiology

## 2021-03-14 ENCOUNTER — Encounter (HOSPITAL_COMMUNITY)
Admission: EM | Disposition: A | Discharge status: Home / Self Care | Payer: Self-pay | Source: Home / Self Care | Attending: Internal Medicine

## 2021-03-14 ENCOUNTER — Encounter (HOSPITAL_COMMUNITY): Payer: Self-pay | Admitting: Internal Medicine

## 2021-03-14 HISTORY — PX: CHOLECYSTECTOMY: SHX55

## 2021-03-14 LAB — COMPREHENSIVE METABOLIC PANEL
ALT: 11 U/L (ref 0–44)
AST: 22 U/L (ref 15–41)
Albumin: 3.1 g/dL — ABNORMAL LOW (ref 3.5–5.0)
Alkaline Phosphatase: 108 U/L (ref 38–126)
Anion gap: 8 (ref 5–15)
BUN: <5 mg/dL — ABNORMAL LOW (ref 6–20)
CO2: 22 mmol/L (ref 22–32)
Calcium: 8 mg/dL — ABNORMAL LOW (ref 8.9–10.3)
Chloride: 103 mmol/L (ref 98–111)
Creatinine, Ser: 0.48 mg/dL (ref 0.44–1.00)
GFR, Estimated: >60 mL/min (ref >60)
Glucose, Bld: 112 mg/dL — ABNORMAL HIGH (ref 70–99)
Potassium: 3.6 mmol/L (ref 3.5–5.1)
Sodium: 133 mmol/L — ABNORMAL LOW (ref 135–145)
Total Bilirubin: 1.1 mg/dL (ref 0.3–1.2)
Total Protein: 6 g/dL — ABNORMAL LOW (ref 6.5–8.1)

## 2021-03-14 LAB — CBC
HCT: 28.6 % — ABNORMAL LOW (ref 36.0–46.0)
Hemoglobin: 9.8 g/dL — ABNORMAL LOW (ref 12.0–15.0)
MCH: 38.9 pg — ABNORMAL HIGH (ref 26.0–34.0)
MCHC: 34.3 g/dL (ref 30.0–36.0)
MCV: 113.5 fL — ABNORMAL HIGH (ref 80.0–100.0)
Platelets: 261 10*3/uL (ref 150–400)
RBC: 2.52 MIL/uL — ABNORMAL LOW (ref 3.87–5.11)
RDW: 13.2 % (ref 11.5–15.5)
WBC: 7.6 10*3/uL (ref 4.0–10.5)
nRBC: 0 % (ref 0.0–0.2)

## 2021-03-14 LAB — LIPASE, BLOOD: Lipase: 72 U/L — ABNORMAL HIGH (ref 11–51)

## 2021-03-14 SURGERY — LAPAROSCOPIC CHOLECYSTECTOMY WITH INTRAOPERATIVE CHOLANGIOGRAM
Anesthesia: General

## 2021-03-14 MED ORDER — HYDROMORPHONE HCL 1 MG/ML IJ SOLN
INTRAMUSCULAR | Status: AC
  Filled 2021-03-14: qty 1

## 2021-03-14 MED ORDER — LACTATED RINGERS IR SOLN
Status: DC | PRN
  Administered 2021-03-14: 1000 mL

## 2021-03-14 MED ORDER — LIDOCAINE 2% (20 MG/ML) 5 ML SYRINGE
INTRAMUSCULAR | Status: AC
  Filled 2021-03-14: qty 10

## 2021-03-14 MED ORDER — DEXMEDETOMIDINE (PRECEDEX) IN NS 20 MCG/5ML (4 MCG/ML) IV SYRINGE
PREFILLED_SYRINGE | INTRAVENOUS | Status: DC | PRN
  Administered 2021-03-14 (×2): 8 ug via INTRAVENOUS
  Administered 2021-03-14: 4 ug via INTRAVENOUS

## 2021-03-14 MED ORDER — ONDANSETRON HCL 4 MG/2ML IJ SOLN
INTRAMUSCULAR | Status: DC | PRN
  Administered 2021-03-14: 4 mg via INTRAVENOUS

## 2021-03-14 MED ORDER — LIDOCAINE 2% (20 MG/ML) 5 ML SYRINGE
INTRAMUSCULAR | Status: DC | PRN
  Administered 2021-03-14: 80 mg via INTRAVENOUS

## 2021-03-14 MED ORDER — FENTANYL CITRATE (PF) 100 MCG/2ML IJ SOLN
INTRAMUSCULAR | Status: DC | PRN
  Administered 2021-03-14: 50 ug via INTRAVENOUS
  Administered 2021-03-14: 100 ug via INTRAVENOUS
  Administered 2021-03-14 (×2): 50 ug via INTRAVENOUS

## 2021-03-14 MED ORDER — AMISULPRIDE (ANTIEMETIC) 5 MG/2ML IV SOLN: 10.0000 mg | Freq: Once | INTRAVENOUS | Status: DC | PRN

## 2021-03-14 MED ORDER — BUPIVACAINE-EPINEPHRINE 0.25% -1:200000 IJ SOLN
INTRAMUSCULAR | Status: DC | PRN
  Administered 2021-03-14: 15 mL

## 2021-03-14 MED ORDER — PROPOFOL 10 MG/ML IV BOLUS
INTRAVENOUS | Status: AC
  Filled 2021-03-14: qty 20

## 2021-03-14 MED ORDER — PHENYLEPHRINE HCL-NACL 20-0.9 MG/250ML-% IV SOLN
INTRAVENOUS | Status: AC
  Filled 2021-03-14: qty 500

## 2021-03-14 MED ORDER — PHENYLEPHRINE 40 MCG/ML (10ML) SYRINGE FOR IV PUSH (FOR BLOOD PRESSURE SUPPORT)
PREFILLED_SYRINGE | INTRAVENOUS | Status: DC | PRN
  Administered 2021-03-14: 40 ug via INTRAVENOUS

## 2021-03-14 MED ORDER — HYDROMORPHONE HCL 1 MG/ML IJ SOLN
0.2500 mg | INTRAMUSCULAR | Status: DC | PRN
  Administered 2021-03-14 (×2): 0.5 mg via INTRAVENOUS
  Administered 2021-03-14 (×2): 0.25 mg via INTRAVENOUS

## 2021-03-14 MED ORDER — PROPOFOL 10 MG/ML IV BOLUS
INTRAVENOUS | Status: DC | PRN
  Administered 2021-03-14: 200 mg via INTRAVENOUS

## 2021-03-14 MED ORDER — FENTANYL CITRATE (PF) 250 MCG/5ML IJ SOLN
INTRAMUSCULAR | Status: AC
  Filled 2021-03-14: qty 5

## 2021-03-14 MED ORDER — MICROFIBRILLAR COLL HEMOSTAT EX PADS
MEDICATED_PAD | CUTANEOUS | Status: DC | PRN
  Administered 2021-03-14: 1 via TOPICAL

## 2021-03-14 MED ORDER — PHENYLEPHRINE 40 MCG/ML (10ML) SYRINGE FOR IV PUSH (FOR BLOOD PRESSURE SUPPORT)
PREFILLED_SYRINGE | INTRAVENOUS | Status: AC
  Filled 2021-03-14: qty 10

## 2021-03-14 MED ORDER — ONDANSETRON HCL 4 MG/2ML IJ SOLN
INTRAMUSCULAR | Status: AC
  Filled 2021-03-14: qty 4

## 2021-03-14 MED ORDER — INDOCYANINE GREEN 25 MG IV SOLR
INTRAVENOUS | Status: DC | PRN
  Administered 2021-03-14: 5 mg via INTRAVENOUS

## 2021-03-14 MED ORDER — ROCURONIUM BROMIDE 10 MG/ML (PF) SYRINGE
PREFILLED_SYRINGE | INTRAVENOUS | Status: DC | PRN
  Administered 2021-03-14: 70 mg via INTRAVENOUS

## 2021-03-14 MED ORDER — DEXAMETHASONE SODIUM PHOSPHATE 10 MG/ML IJ SOLN
INTRAMUSCULAR | Status: DC | PRN
Start: 2021-03-14 — End: 2021-03-14
  Administered 2021-03-14: 10 mg via INTRAVENOUS

## 2021-03-14 MED ORDER — SUGAMMADEX SODIUM 200 MG/2ML IV SOLN
INTRAVENOUS | Status: DC | PRN
  Administered 2021-03-14: 200 mg via INTRAVENOUS

## 2021-03-14 MED ORDER — PROMETHAZINE HCL 25 MG/ML IJ SOLN: 6.2500 mg | INTRAMUSCULAR | Status: DC | PRN

## 2021-03-14 MED ORDER — ROCURONIUM BROMIDE 10 MG/ML (PF) SYRINGE
PREFILLED_SYRINGE | INTRAVENOUS | Status: AC
  Filled 2021-03-14: qty 20

## 2021-03-14 MED ORDER — OXYCODONE HCL 5 MG PO TABS: 5.0000 mg | ORAL_TABLET | Freq: Once | ORAL | Status: DC | PRN

## 2021-03-14 MED ORDER — MEPERIDINE HCL 50 MG/ML IJ SOLN: 6.2500 mg | INTRAMUSCULAR | Status: DC | PRN

## 2021-03-14 MED ORDER — VITAMIN B-12 1000 MCG PO TABS
1000.0000 ug | ORAL_TABLET | Freq: Every day | ORAL | Status: DC
  Administered 2021-03-15: 09:00:00 1000 ug via ORAL
  Filled 2021-03-14: qty 1

## 2021-03-14 MED ORDER — MIDAZOLAM HCL 2 MG/2ML IJ SOLN
INTRAMUSCULAR | Status: AC
  Filled 2021-03-14: qty 2

## 2021-03-14 MED ORDER — BUPIVACAINE-EPINEPHRINE (PF) 0.25% -1:200000 IJ SOLN
INTRAMUSCULAR | Status: AC
  Filled 2021-03-14: qty 30

## 2021-03-14 MED ORDER — LACTATED RINGERS IV SOLN: INTRAVENOUS | Status: DC | PRN

## 2021-03-14 MED ORDER — SODIUM CHLORIDE 0.9 % IV SOLN: INTRAVENOUS | Status: DC

## 2021-03-14 MED ORDER — DEXAMETHASONE SODIUM PHOSPHATE 10 MG/ML IJ SOLN
INTRAMUSCULAR | Status: AC
  Filled 2021-03-14: qty 2

## 2021-03-14 MED ORDER — MIDAZOLAM HCL 5 MG/5ML IJ SOLN
INTRAMUSCULAR | Status: DC | PRN
  Administered 2021-03-14: 2 mg via INTRAVENOUS

## 2021-03-14 MED ORDER — OXYCODONE HCL 5 MG/5ML PO SOLN: 5.0000 mg | Freq: Once | ORAL | Status: DC | PRN

## 2021-03-14 MED ORDER — INDOCYANINE GREEN 25 MG IV SOLR
INTRAVENOUS | Status: AC
  Filled 2021-03-14: qty 10

## 2021-03-14 MED ORDER — 0.9 % SODIUM CHLORIDE (POUR BTL) OPTIME
TOPICAL | Status: DC | PRN
  Administered 2021-03-14: 1000 mL

## 2021-03-14 SURGICAL SUPPLY — 54 items
ADH SKN CLS APL DERMABOND .7 (GAUZE/BANDAGES/DRESSINGS)
APL PRP STRL LF DISP 70% ISPRP (MISCELLANEOUS) ×1
APL SKNCLS STERI-STRIP NONHPOA (GAUZE/BANDAGES/DRESSINGS)
APPLIER CLIP 5 13 M/L LIGAMAX5 (MISCELLANEOUS) ×3
APPLIER CLIP ROT 10 11.4 M/L (STAPLE)
APR CLP MED LRG 11.4X10 (STAPLE)
APR CLP MED LRG 5 ANG JAW (MISCELLANEOUS) ×1
BAG COUNTER SPONGE SURGICOUNT (BAG) IMPLANT
BAG SPEC RTRVL 10 TROC 200 (ENDOMECHANICALS) ×1
BAG SPNG CNTER NS LX DISP (BAG)
BAG SURGICOUNT SPONGE COUNTING (BAG)
BENZOIN TINCTURE PRP APPL 2/3 (GAUZE/BANDAGES/DRESSINGS) IMPLANT
CABLE HIGH FREQUENCY MONO STRZ (ELECTRODE) ×3 IMPLANT
CHLORAPREP W/TINT 26 (MISCELLANEOUS) ×3 IMPLANT
CLIP APPLIE 5 13 M/L LIGAMAX5 (MISCELLANEOUS) ×1 IMPLANT
CLIP APPLIE ROT 10 11.4 M/L (STAPLE) IMPLANT
CLOSURE STERI-STRIP 1/2X4 (GAUZE/BANDAGES/DRESSINGS) ×1
CLOSURE WOUND 1/2 X4 (GAUZE/BANDAGES/DRESSINGS)
CLSR STERI-STRIP ANTIMIC 1/2X4 (GAUZE/BANDAGES/DRESSINGS) ×1 IMPLANT
COVER MAYO STAND STRL (DRAPES) IMPLANT
COVER SURGICAL LIGHT HANDLE (MISCELLANEOUS) ×3 IMPLANT
DECANTER SPIKE VIAL GLASS SM (MISCELLANEOUS) ×3 IMPLANT
DERMABOND ADVANCED (GAUZE/BANDAGES/DRESSINGS)
DERMABOND ADVANCED .7 DNX12 (GAUZE/BANDAGES/DRESSINGS) IMPLANT
DRAPE C-ARM 42X120 X-RAY (DRAPES) IMPLANT
ELECT REM PT RETURN 15FT ADLT (MISCELLANEOUS) ×3 IMPLANT
GLOVE SURG ENC MOIS LTX SZ7 (GLOVE) ×3 IMPLANT
GLOVE SURG UNDER POLY LF SZ7.5 (GLOVE) ×3 IMPLANT
GOWN STRL REUS W/TWL LRG LVL3 (GOWN DISPOSABLE) ×3 IMPLANT
GOWN STRL REUS W/TWL XL LVL3 (GOWN DISPOSABLE) ×6 IMPLANT
GRASPER SUT TROCAR 14GX15 (MISCELLANEOUS) IMPLANT
HEMOSTAT SNOW SURGICEL 2X4 (HEMOSTASIS) ×2 IMPLANT
IRRIG SUCT STRYKERFLOW 2 WTIP (MISCELLANEOUS) ×3
IRRIGATION SUCT STRKRFLW 2 WTP (MISCELLANEOUS) ×1 IMPLANT
KIT BASIN OR (CUSTOM PROCEDURE TRAY) ×3 IMPLANT
KIT TURNOVER KIT A (KITS) IMPLANT
PENCIL SMOKE EVACUATOR (MISCELLANEOUS) IMPLANT
POUCH RETRIEVAL ECOSAC 10 (ENDOMECHANICALS) ×1 IMPLANT
POUCH RETRIEVAL ECOSAC 10MM (ENDOMECHANICALS) ×2
PROTECTOR NERVE ULNAR (MISCELLANEOUS) IMPLANT
SCISSORS LAP 5X35 DISP (ENDOMECHANICALS) ×3 IMPLANT
SET CHOLANGIOGRAPH MIX (MISCELLANEOUS) IMPLANT
SET TUBE SMOKE EVAC HIGH FLOW (TUBING) ×3 IMPLANT
SLEEVE XCEL OPT CAN 5 100 (ENDOMECHANICALS) ×6 IMPLANT
STRIP CLOSURE SKIN 1/2X4 (GAUZE/BANDAGES/DRESSINGS) IMPLANT
SUT MNCRL AB 4-0 PS2 18 (SUTURE) ×3 IMPLANT
SUT VICRYL 0 TIES 12 18 (SUTURE) IMPLANT
SUT VICRYL 0 UR6 27IN ABS (SUTURE) IMPLANT
TOWEL OR 17X26 10 PK STRL BLUE (TOWEL DISPOSABLE) ×3 IMPLANT
TOWEL OR NON WOVEN STRL DISP B (DISPOSABLE) ×3 IMPLANT
TRAY LAPAROSCOPIC (CUSTOM PROCEDURE TRAY) ×3 IMPLANT
TROCAR BLADELESS OPT 5 100 (ENDOMECHANICALS) ×3 IMPLANT
TROCAR XCEL BLUNT TIP 100MML (ENDOMECHANICALS) ×3 IMPLANT
TROCAR XCEL NON-BLD 11X100MML (ENDOMECHANICALS) IMPLANT

## 2021-03-14 NOTE — Assessment & Plan Note (Addendum)
MRCP negative for any CBD stone.

## 2021-03-14 NOTE — Assessment & Plan Note (Addendum)
Presents with 5-day history of abdominal pain radiating to her back with nausea and vomiting. CT scan shows evidence of pancreatitis lipase level elevated LFT normal. CT scan shows evidence of evidence of cholelithiasis without CBD dilation as well. Most likely this is gallstone pancreatitis. Patient denies any alcohol abuse, drug abuse track recently was normal.  General surgery consulted.  Appreciate assistance.  Underwent lap chole.  12/17. MRCP negative for any CBD stone. Pt was seen by General surgery and was discharged.

## 2021-03-14 NOTE — Op Note (Signed)
Preoperative diagnosis: gallstone pancreatitis Postoperative diagnosis: saa Procedure: Laparoscopic cholecystectomy Surgeon: Dr. Serita Grammes Anesthesia: General Estimated blood loss: minimal Complications: None Drains: None Specimens: Gallbladder and contents to pathology Sponge needle count was correct at completion Disposition to recovery stable condition  Indications: 59 yof with gallstone pancreatitis that has resolved. We discussed lap chole.    Procedure: After informed consent was obtained she was taken to the OR.   She was given antibiotics.  SCDs were in place.  She was then placed under general anesthesia without complication.  She was prepped and draped in the standard sterile surgical fashion.  A surgical timeout was then performed.  I infiltrated Marcaine below the umbilicus. I then made a vertical incision. I incised the fascia and then entered the peritoneum bluntly.  I placed a 0 vicryl pursestring suture and inserted a Hasson trocar. I then insufflated the abdomen to 15 mm Hg pressure.  I then placed 3 additional 5 mm trocars in epigastrium and right side of the abdomen under direct vision without complication.  I retracted the gallbladder cephalad and lateral. Her gallbladder had   I did have her injected with ICG dye and used this to help identify the critical view of safety. The gallbladder was adherent to the common duct but I was able to dissect this off. The cystic duct was very short and I did not do a cholangiogram because of this.   I was able to obtain the critical view of safety and lift the gallbladder off the liver.  I then clipped the cystic duct 3 times.  I divided it leaving 2 clips in place.  The cystic duct was viable and the clips completely traversed the duct.  I treated the artery in a similar fashion. The gallbladder is then removed from the liver bed.  I then placed the gallbladder in retrieval bag. I removed all of this from the umbilical trocar site.   Hemostasis was obtained.  I removed the Hasson trocar and closed with 2 2-0 vicryl sutures using the suture passer device. I then desufflated the abdomen and removed the remaining trocars. Incisions were closed with 4-0 Monocryl and glue.  She tolerated this well was extubated transferred to recovery stable.

## 2021-03-14 NOTE — Anesthesia Procedure Notes (Signed)
Procedure Name: Intubation Date/Time: 03/14/2021 8:00 AM Performed by: Lavina Hamman, CRNA Pre-anesthesia Checklist: Patient identified, Emergency Drugs available, Suction available and Patient being monitored Patient Re-evaluated:Patient Re-evaluated prior to induction Oxygen Delivery Method: Circle System Utilized Preoxygenation: Pre-oxygenation with 100% oxygen Induction Type: IV induction Ventilation: Mask ventilation without difficulty Laryngoscope Size: Mac and 3 Grade View: Grade I Tube type: Oral Tube size: 7.0 mm Number of attempts: 1 Airway Equipment and Method: Stylet and Oral airway Placement Confirmation: ETT inserted through vocal cords under direct vision, positive ETCO2 and breath sounds checked- equal and bilateral Secured at: 21 cm Tube secured with: Tape Dental Injury: Teeth and Oropharynx as per pre-operative assessment  Comments: ATOI

## 2021-03-14 NOTE — Assessment & Plan Note (Addendum)
Replaced. °

## 2021-03-14 NOTE — Progress Notes (Signed)
Day of Surgery   Subjective/Chief Complaint: Feels normal, having bowel function, mrcp yesterday with no choledocholithiasis, pancreatic inflammation   Objective: Vital signs in last 24 hours: Temp:  [98.1 F (36.7 C)-98.6 F (37 C)] 98.6 F (37 C) (12/17 0600) Pulse Rate:  [79-97] 79 (12/17 0600) Resp:  [17-18] 17 (12/17 0600) BP: (132-145)/(90-108) 143/100 (12/17 0600) SpO2:  [100 %] 100 % (12/17 0600) Weight:  [68.7 kg] 68.7 kg (12/17 0500) Last BM Date: 03/10/21  Intake/Output from previous day: 12/16 0701 - 12/17 0700 In: 2796.4 [P.O.:1320; I.V.:1476.4] Out: 0  Intake/Output this shift: Total I/O In: 2076.4 [P.O.:600; I.V.:1476.4] Out: -   GI: soft minimally tender epigastrium nondistended  Lab Results:  Recent Labs    03/12/21 0503 03/13/21 0432  WBC 6.7 7.2  HGB 10.5* 11.5*  HCT 30.7* 32.7*  PLT 183 217   BMET Recent Labs    03/12/21 1403 03/13/21 0432  NA 133* 133*  K 3.4* 3.6  CL 102 98  CO2 22 23  GLUCOSE 84 77  BUN <5* <5*  CREATININE 0.54 0.46  CALCIUM 7.4* 8.3*   PT/INR No results for input(s): LABPROT, INR in the last 72 hours. ABG No results for input(s): PHART, HCO3 in the last 72 hours.  Invalid input(s): PCO2, PO2  Studies/Results: MR 3D Recon At Scanner  Result Date: 03/13/2021 CLINICAL DATA:  40 year old female with history of abdominal pain. History of pancreatitis. Suspected cholelithiasis. EXAM: MRI ABDOMEN WITHOUT AND WITH CONTRAST (INCLUDING MRCP) TECHNIQUE: Multiplanar multisequence MR imaging of the abdomen was performed both before and after the administration of intravenous contrast. Heavily T2-weighted images of the biliary and pancreatic ducts were obtained, and three-dimensional MRCP images were rendered by post processing. CONTRAST:  54mL GADAVIST GADOBUTROL 1 MMOL/ML IV SOLN COMPARISON:  No prior abdominal MRI. CT the abdomen and pelvis 03/11/2021. FINDINGS: Lower chest: Unremarkable. Hepatobiliary: No suspicious cystic  or solid hepatic lesions. No intrahepatic biliary ductal dilatation noted on MRCP images. Common bile duct measures 6 mm in the porta hepatis. No filling defect in the common bile duct to suggest choledocholithiasis. There are innumerable filling defects within the dependent portion of the gallbladder, compatible with innumerable small gallstones. Gallbladder is moderately distended. Gallbladder wall does not appear thickened. No pericholecystic fluid or surrounding inflammatory changes to indicate an acute diverticulitis at this time. Pancreas: Pancreatic parenchyma enhances normally. However, there are extensive peripancreatic fluid collections and there is a trace amount of peripancreatic fluid which does not appear well organized at this time (i.e., no pseudocyst noted), indicative of acute pancreatitis. No pancreatic ductal dilatation noted on MRCP images. Spleen:  Unremarkable. Adrenals/Urinary Tract: Bilateral kidneys and adrenal glands are normal in appearance. No hydroureteronephrosis noted in the visualized portions of the abdomen. Stomach/Bowel: Visualized portions are unremarkable. Vascular/Lymphatic: No aneurysm identified in the visualized abdominal vasculature. No lymphadenopathy noted in the abdomen. Other: Trace amount of peripancreatic fluid and inflammatory changes. No significant volume of ascites noted in the visualized portions of the peritoneal cavity. Musculoskeletal: No aggressive appearing osseous lesions are noted in the visualized portions of the skeleton. IMPRESSION: 1. Acute pancreatitis redemonstrated. No evidence of pancreatic necrosis. There are peripancreatic inflammatory changes without a well-defined pseudocyst at this time. 2. Study is positive for cholelithiasis, with no findings to clearly indicate an acute cholecystitis at this time. 3. No choledocholithiasis or signs of acute biliary tract obstruction. Electronically Signed   By: Vinnie Langton M.D.   On: 03/13/2021 12:59    MR ABDOMEN MRCP W  WO CONTAST  Result Date: 03/13/2021 CLINICAL DATA:  40 year old female with history of abdominal pain. History of pancreatitis. Suspected cholelithiasis. EXAM: MRI ABDOMEN WITHOUT AND WITH CONTRAST (INCLUDING MRCP) TECHNIQUE: Multiplanar multisequence MR imaging of the abdomen was performed both before and after the administration of intravenous contrast. Heavily T2-weighted images of the biliary and pancreatic ducts were obtained, and three-dimensional MRCP images were rendered by post processing. CONTRAST:  8mL GADAVIST GADOBUTROL 1 MMOL/ML IV SOLN COMPARISON:  No prior abdominal MRI. CT the abdomen and pelvis 03/11/2021. FINDINGS: Lower chest: Unremarkable. Hepatobiliary: No suspicious cystic or solid hepatic lesions. No intrahepatic biliary ductal dilatation noted on MRCP images. Common bile duct measures 6 mm in the porta hepatis. No filling defect in the common bile duct to suggest choledocholithiasis. There are innumerable filling defects within the dependent portion of the gallbladder, compatible with innumerable small gallstones. Gallbladder is moderately distended. Gallbladder wall does not appear thickened. No pericholecystic fluid or surrounding inflammatory changes to indicate an acute diverticulitis at this time. Pancreas: Pancreatic parenchyma enhances normally. However, there are extensive peripancreatic fluid collections and there is a trace amount of peripancreatic fluid which does not appear well organized at this time (i.e., no pseudocyst noted), indicative of acute pancreatitis. No pancreatic ductal dilatation noted on MRCP images. Spleen:  Unremarkable. Adrenals/Urinary Tract: Bilateral kidneys and adrenal glands are normal in appearance. No hydroureteronephrosis noted in the visualized portions of the abdomen. Stomach/Bowel: Visualized portions are unremarkable. Vascular/Lymphatic: No aneurysm identified in the visualized abdominal vasculature. No lymphadenopathy noted  in the abdomen. Other: Trace amount of peripancreatic fluid and inflammatory changes. No significant volume of ascites noted in the visualized portions of the peritoneal cavity. Musculoskeletal: No aggressive appearing osseous lesions are noted in the visualized portions of the skeleton. IMPRESSION: 1. Acute pancreatitis redemonstrated. No evidence of pancreatic necrosis. There are peripancreatic inflammatory changes without a well-defined pseudocyst at this time. 2. Study is positive for cholelithiasis, with no findings to clearly indicate an acute cholecystitis at this time. 3. No choledocholithiasis or signs of acute biliary tract obstruction. Electronically Signed   By: Vinnie Langton M.D.   On: 03/13/2021 12:59    Anti-infectives: Anti-infectives (From admission, onward)    Start     Dose/Rate Route Frequency Ordered Stop   03/14/21 0600  cefTRIAXone (ROCEPHIN) 1 g in sodium chloride 0.9 % 100 mL IVPB        1 g 200 mL/hr over 30 Minutes Intravenous On call to O.R. 03/13/21 1137 03/15/21 0559       Assessment/Plan: GSP -clinically resolved, I think reasonable to proceed with lap chole today as planned -I discussed the procedure in detail.  We discussed the risks and benefits of a laparoscopic cholecystectomy and possible cholangiogram including, but not limited to bleeding, infection, injury to surrounding structures such as the intestine or liver, bile leak, retained gallstones, need to convert to an open procedure, subtotal cholecystectomy, blood clots such as  DVT, common bile duct injury, anesthesia risks, and possible need for additional procedures.  The likelihood of improvement in symptoms and return to the patient's normal status is good. We discussed the typical post-operative recovery course. Likely home tomorrow   Rolm Bookbinder 03/14/2021

## 2021-03-14 NOTE — Transfer of Care (Signed)
Immediate Anesthesia Transfer of Care Note  Patient: Holly Graham  Procedure(s) Performed: Procedure(s) with comments: LAPAROSCOPIC CHOLECYSTECTOMY  WITH ICG DYE (N/A) - LDOW  Patient Location: PACU  Anesthesia Type:General  Level of Consciousness:  sedated, patient cooperative and responds to stimulation  Airway & Oxygen Therapy:Patient Spontanous Breathing and Patient connected to face mask oxgen  Post-op Assessment:  Report given to PACU RN and Post -op Vital signs reviewed and stable  Post vital signs:  Reviewed and stable  Last Vitals:  Vitals:   03/13/21 2128 03/14/21 0600  BP: (!) 145/108 (!) 143/100  Pulse: 97 79  Resp: 18 17  Temp: 36.7 C 37 C  SpO2: 816% 619%    Complications: No apparent anesthesia complications

## 2021-03-14 NOTE — Assessment & Plan Note (Signed)
Replaced. °

## 2021-03-14 NOTE — Anesthesia Postprocedure Evaluation (Signed)
Anesthesia Post Note  Patient: Holly Graham  Procedure(s) Performed: LAPAROSCOPIC CHOLECYSTECTOMY  WITH ICG DYE     Patient location during evaluation: PACU Anesthesia Type: General Level of consciousness: awake and alert Pain management: pain level controlled Vital Signs Assessment: post-procedure vital signs reviewed and stable Respiratory status: spontaneous breathing, nonlabored ventilation and respiratory function stable Cardiovascular status: blood pressure returned to baseline and stable Postop Assessment: no apparent nausea or vomiting Anesthetic complications: no   No notable events documented.  Last Vitals:  Vitals:   03/14/21 1000 03/14/21 1005  BP: 120/79 122/75  Pulse:    Resp: 14 13  Temp:  36.5 C  SpO2:  97%    Last Pain:  Vitals:   03/14/21 1005  TempSrc:   PainSc: [place]

## 2021-03-14 NOTE — Anesthesia Preprocedure Evaluation (Signed)
Anesthesia Evaluation  Patient identified by MRN, date of birth, ID band Patient awake    Reviewed: Allergy & Precautions, NPO status , Patient's Chart, lab work & pertinent test results  Airway Mallampati: I  TM Distance: >3 FB Neck ROM: Full    Dental no notable dental hx.    Pulmonary    Pulmonary exam normal breath sounds clear to auscultation       Cardiovascular Normal cardiovascular exam Rhythm:Regular Rate:Normal     Neuro/Psych    GI/Hepatic   Endo/Other    Renal/GU      Musculoskeletal   Abdominal   Peds  Hematology   Anesthesia Other Findings   Reproductive/Obstetrics                             Anesthesia Physical  Anesthesia Plan  ASA: 2  Anesthesia Plan: General   Post-op Pain Management:    Induction: Intravenous  PONV Risk Score and Plan: 3 and Ondansetron, Midazolam, Treatment may vary due to age or medical condition and Dexamethasone  Airway Management Planned: Oral ETT  Additional Equipment:   Intra-op Plan:   Post-operative Plan: Extubation in OR  Informed Consent: I have reviewed the patients History and Physical, chart, labs and discussed the procedure including the risks, benefits and alternatives for the proposed anesthesia with the patient or authorized representative who has indicated his/her understanding and acceptance.       Plan Discussed with: CRNA and Surgeon  Anesthesia Plan Comments: (See PAT note 11/16/2018, Konrad Felix, PA-C)        Anesthesia Quick Evaluation

## 2021-03-14 NOTE — Progress Notes (Signed)
[facility] Progress Note  Patient: Holly Graham    OZD:[id number]  DOA: 03/11/2021    Date of Service: the patient was seen and examined on 03/14/2021  Brief hospital course: 12/14 presented with abdominal pain found to have pancreatitis. 12/15 General surgery consulted. 12/16 MRCP negative.  Currently scheduled for lap chole on 12/17.  Assessment and Plan: * Acute gallstone pancreatitis Presents with 5-day history of abdominal pain radiating to her back with nausea and vomiting. CT scan shows evidence of pancreatitis lipase level elevated LFT normal. CT scan shows evidence of evidence of cholelithiasis without CBD dilation as well. Most likely this is gallstone pancreatitis. Patient denies any alcohol abuse, drug abuse track recently was normal.  General surgery consulted.  Appreciate assistance.  Underwent lap chole.  12/17. MRCP negative for any CBD stone. Currently on antibiotic postop. Diet advanced to full liquid diet. Continue other supportive measures.  Hyperbilirubinemia MRCP negative for any CBD stone. Continue with IV hydration.  Hypokalemia Being replaced. Monitor daily.   Hypomagnesemia Replaced.  Macrocytic anemia Hemoglobin currently stable.  But on the lower side. MCV more than 100. Q59 low normal.  Folic acid and TSH normal.  We will supplement with B12.    Body mass index is 26.83 kg/m.        Subjective: Seen after the surgery.  Continues to have some pain.  No nausea no vomiting.  Not passing gas.  Objective: Vital signs were reviewed and unremarkable.  Exam: General: Appear in mild distress, no Rash; Oral Mucosa Clear, moist. no Abnormal Neck Mass Or lumps, Conjunctiva normal  Cardiovascular: S1 and S2 Present, no Murmur, Respiratory: good respiratory effort, Bilateral Air entry present and CTA, no Crackles, no wheezes Abdomen: Bowel Sound present, Soft and diffuse abdominal tenderness Extremities: no Pedal edema Neurology: alert  and oriented to time, place, and person affect appropriate. no new focal deficit Gait not checked due to patient safety concerns    Data Reviewed: My review of labs, imaging, notes and other tests shows no new significant findings.   Disposition:  Status is: Inpatient  Remains inpatient appropriate because: With ongoing pain control issues as well as poor p.o. intake secondary to the same.  Family Communication: None at bedside  DVT Prophylaxis: enoxaparin (LOVENOX) injection 40 mg Start: 03/11/21 2200   Time spent: 35 minutes.   Author: Berle Mull  03/14/2021 6:48 PM  To reach On-call, see care teams to locate the attending and reach out via [email or web address]. Between 7PM-7AM, please contact night-coverage If you still have difficulty reaching the attending provider, please page the [facility] (Director on Call) for [facility] on amion for assistance.

## 2021-03-14 NOTE — H&P (View-Only) (Signed)
Day of Surgery   Subjective/Chief Complaint: Feels normal, having bowel function, mrcp yesterday with no choledocholithiasis, pancreatic inflammation   Objective: Vital signs in last 24 hours: Temp:  [98.1 F (36.7 C)-98.6 F (37 C)] 98.6 F (37 C) (12/17 0600) Pulse Rate:  [79-97] 79 (12/17 0600) Resp:  [17-18] 17 (12/17 0600) BP: (132-145)/(90-108) 143/100 (12/17 0600) SpO2:  [100 %] 100 % (12/17 0600) Weight:  [68.7 kg] 68.7 kg (12/17 0500) Last BM Date: 03/10/21  Intake/Output from previous day: 12/16 0701 - 12/17 0700 In: 2796.4 [P.O.:1320; I.V.:1476.4] Out: 0  Intake/Output this shift: Total I/O In: 2076.4 [P.O.:600; I.V.:1476.4] Out: -   GI: soft minimally tender epigastrium nondistended  Lab Results:  Recent Labs    03/12/21 0503 03/13/21 0432  WBC 6.7 7.2  HGB 10.5* 11.5*  HCT 30.7* 32.7*  PLT 183 217   BMET Recent Labs    03/12/21 1403 03/13/21 0432  NA 133* 133*  K 3.4* 3.6  CL 102 98  CO2 22 23  GLUCOSE 84 77  BUN <5* <5*  CREATININE 0.54 0.46  CALCIUM 7.4* 8.3*   PT/INR No results for input(s): LABPROT, INR in the last 72 hours. ABG No results for input(s): PHART, HCO3 in the last 72 hours.  Invalid input(s): PCO2, PO2  Studies/Results: MR 3D Recon At Scanner  Result Date: 03/13/2021 CLINICAL DATA:  40 year old female with history of abdominal pain. History of pancreatitis. Suspected cholelithiasis. EXAM: MRI ABDOMEN WITHOUT AND WITH CONTRAST (INCLUDING MRCP) TECHNIQUE: Multiplanar multisequence MR imaging of the abdomen was performed both before and after the administration of intravenous contrast. Heavily T2-weighted images of the biliary and pancreatic ducts were obtained, and three-dimensional MRCP images were rendered by post processing. CONTRAST:  85mL GADAVIST GADOBUTROL 1 MMOL/ML IV SOLN COMPARISON:  No prior abdominal MRI. CT the abdomen and pelvis 03/11/2021. FINDINGS: Lower chest: Unremarkable. Hepatobiliary: No suspicious cystic  or solid hepatic lesions. No intrahepatic biliary ductal dilatation noted on MRCP images. Common bile duct measures 6 mm in the porta hepatis. No filling defect in the common bile duct to suggest choledocholithiasis. There are innumerable filling defects within the dependent portion of the gallbladder, compatible with innumerable small gallstones. Gallbladder is moderately distended. Gallbladder wall does not appear thickened. No pericholecystic fluid or surrounding inflammatory changes to indicate an acute diverticulitis at this time. Pancreas: Pancreatic parenchyma enhances normally. However, there are extensive peripancreatic fluid collections and there is a trace amount of peripancreatic fluid which does not appear well organized at this time (i.e., no pseudocyst noted), indicative of acute pancreatitis. No pancreatic ductal dilatation noted on MRCP images. Spleen:  Unremarkable. Adrenals/Urinary Tract: Bilateral kidneys and adrenal glands are normal in appearance. No hydroureteronephrosis noted in the visualized portions of the abdomen. Stomach/Bowel: Visualized portions are unremarkable. Vascular/Lymphatic: No aneurysm identified in the visualized abdominal vasculature. No lymphadenopathy noted in the abdomen. Other: Trace amount of peripancreatic fluid and inflammatory changes. No significant volume of ascites noted in the visualized portions of the peritoneal cavity. Musculoskeletal: No aggressive appearing osseous lesions are noted in the visualized portions of the skeleton. IMPRESSION: 1. Acute pancreatitis redemonstrated. No evidence of pancreatic necrosis. There are peripancreatic inflammatory changes without a well-defined pseudocyst at this time. 2. Study is positive for cholelithiasis, with no findings to clearly indicate an acute cholecystitis at this time. 3. No choledocholithiasis or signs of acute biliary tract obstruction. Electronically Signed   By: Vinnie Langton M.D.   On: 03/13/2021 12:59    MR ABDOMEN MRCP W  WO CONTAST  Result Date: 03/13/2021 CLINICAL DATA:  40 year old female with history of abdominal pain. History of pancreatitis. Suspected cholelithiasis. EXAM: MRI ABDOMEN WITHOUT AND WITH CONTRAST (INCLUDING MRCP) TECHNIQUE: Multiplanar multisequence MR imaging of the abdomen was performed both before and after the administration of intravenous contrast. Heavily T2-weighted images of the biliary and pancreatic ducts were obtained, and three-dimensional MRCP images were rendered by post processing. CONTRAST:  30mL GADAVIST GADOBUTROL 1 MMOL/ML IV SOLN COMPARISON:  No prior abdominal MRI. CT the abdomen and pelvis 03/11/2021. FINDINGS: Lower chest: Unremarkable. Hepatobiliary: No suspicious cystic or solid hepatic lesions. No intrahepatic biliary ductal dilatation noted on MRCP images. Common bile duct measures 6 mm in the porta hepatis. No filling defect in the common bile duct to suggest choledocholithiasis. There are innumerable filling defects within the dependent portion of the gallbladder, compatible with innumerable small gallstones. Gallbladder is moderately distended. Gallbladder wall does not appear thickened. No pericholecystic fluid or surrounding inflammatory changes to indicate an acute diverticulitis at this time. Pancreas: Pancreatic parenchyma enhances normally. However, there are extensive peripancreatic fluid collections and there is a trace amount of peripancreatic fluid which does not appear well organized at this time (i.e., no pseudocyst noted), indicative of acute pancreatitis. No pancreatic ductal dilatation noted on MRCP images. Spleen:  Unremarkable. Adrenals/Urinary Tract: Bilateral kidneys and adrenal glands are normal in appearance. No hydroureteronephrosis noted in the visualized portions of the abdomen. Stomach/Bowel: Visualized portions are unremarkable. Vascular/Lymphatic: No aneurysm identified in the visualized abdominal vasculature. No lymphadenopathy noted  in the abdomen. Other: Trace amount of peripancreatic fluid and inflammatory changes. No significant volume of ascites noted in the visualized portions of the peritoneal cavity. Musculoskeletal: No aggressive appearing osseous lesions are noted in the visualized portions of the skeleton. IMPRESSION: 1. Acute pancreatitis redemonstrated. No evidence of pancreatic necrosis. There are peripancreatic inflammatory changes without a well-defined pseudocyst at this time. 2. Study is positive for cholelithiasis, with no findings to clearly indicate an acute cholecystitis at this time. 3. No choledocholithiasis or signs of acute biliary tract obstruction. Electronically Signed   By: Vinnie Langton M.D.   On: 03/13/2021 12:59    Anti-infectives: Anti-infectives (From admission, onward)    Start     Dose/Rate Route Frequency Ordered Stop   03/14/21 0600  cefTRIAXone (ROCEPHIN) 1 g in sodium chloride 0.9 % 100 mL IVPB        1 g 200 mL/hr over 30 Minutes Intravenous On call to O.R. 03/13/21 1137 03/15/21 0559       Assessment/Plan: GSP -clinically resolved, I think reasonable to proceed with lap chole today as planned -I discussed the procedure in detail.  We discussed the risks and benefits of a laparoscopic cholecystectomy and possible cholangiogram including, but not limited to bleeding, infection, injury to surrounding structures such as the intestine or liver, bile leak, retained gallstones, need to convert to an open procedure, subtotal cholecystectomy, blood clots such as  DVT, common bile duct injury, anesthesia risks, and possible need for additional procedures.  The likelihood of improvement in symptoms and return to the patient's normal status is good. We discussed the typical post-operative recovery course. Likely home tomorrow   Rolm Bookbinder 03/14/2021

## 2021-03-14 NOTE — Assessment & Plan Note (Signed)
Hemoglobin currently stable.  But on the lower side. MCV more than 100. H59 low normal.  Folic acid and TSH normal.  We will supplement with B12.

## 2021-03-14 NOTE — Interval H&P Note (Signed)
History and Physical Interval Note:  03/14/2021 6:58 AM  Holly Graham  has presented today for surgery, with the diagnosis of gallstones.  The various methods of treatment have been discussed with the patient and family. After consideration of risks, benefits and other options for treatment, the patient has consented to  Procedure(s) with comments: [place] CHOLANGIOGRAM WITH ICG DYE (N/A) - LDOW as a surgical intervention.  The patient's history has been reviewed, patient examined, no change in status, stable for surgery.  I have reviewed the patient's chart and labs.  Questions were answered to the patient's satisfaction.     Rolm Bookbinder

## 2021-03-15 ENCOUNTER — Encounter (HOSPITAL_COMMUNITY): Payer: Self-pay | Admitting: General Surgery

## 2021-03-15 LAB — COMPREHENSIVE METABOLIC PANEL
ALT: 11 U/L (ref 0–44)
AST: 26 U/L (ref 15–41)
Albumin: 3 g/dL — ABNORMAL LOW (ref 3.5–5.0)
Alkaline Phosphatase: 105 U/L (ref 38–126)
Anion gap: 7 (ref 5–15)
BUN: <5 mg/dL — ABNORMAL LOW (ref 6–20)
CO2: 24 mmol/L (ref 22–32)
Calcium: 8.6 mg/dL — ABNORMAL LOW (ref 8.9–10.3)
Chloride: 100 mmol/L (ref 98–111)
Creatinine, Ser: 0.51 mg/dL (ref 0.44–1.00)
GFR, Estimated: >60 mL/min (ref >60)
Glucose, Bld: 97 mg/dL (ref 70–99)
Potassium: 3.9 mmol/L (ref 3.5–5.1)
Sodium: 131 mmol/L — ABNORMAL LOW (ref 135–145)
Total Bilirubin: 0.6 mg/dL (ref 0.3–1.2)
Total Protein: 6 g/dL — ABNORMAL LOW (ref 6.5–8.1)

## 2021-03-15 MED ORDER — OXYCODONE HCL 5 MG PO TABS: 5.0000 mg | ORAL_TABLET | Freq: Four times a day (QID) | ORAL | 0 refills | Status: DC | PRN

## 2021-03-15 NOTE — Progress Notes (Signed)
1 Day Post-Op   Subjective/Chief Complaint: Complains of soreness but otherwise ok. Has not eaten yet   Objective: Vital signs in last 24 hours: Temp:  [97.6 F (36.4 C)-98.4 F (36.9 C)] 97.9 F (36.6 C) (12/18 0419) Pulse Rate:  [72-97] 79 (12/18 0419) Resp:  [11-29] 18 (12/18 0006) BP: (120-153)/(73-124) 125/101 (12/18 0419) SpO2:  [93 %-100 %] 100 % (12/18 0419) Last BM Date: 03/14/21  Intake/Output from previous day: 12/17 0701 - 12/18 0700 In: 1990 [P.O.:590; I.V.:1400] Out: -  Intake/Output this shift: No intake/output data recorded.  General appearance: alert and cooperative Resp: clear to auscultation bilaterally Cardio: regular rate and rhythm GI: soft, mild tenderness. Incisions ok  Lab Results:  Recent Labs    03/13/21 0432 03/14/21 0931  WBC 7.2 7.6  HGB 11.5* 9.8*  HCT 32.7* 28.6*  PLT 217 261   BMET Recent Labs    03/14/21 0931 03/15/21 0444  NA 133* 131*  K 3.6 3.9  CL 103 100  CO2 22 24  GLUCOSE 112* 97  BUN <5* <5*  CREATININE 0.48 0.51  CALCIUM 8.0* 8.6*   PT/INR No results for input(s): LABPROT, INR in the last 72 hours. ABG No results for input(s): PHART, HCO3 in the last 72 hours.  Invalid input(s): PCO2, PO2  Studies/Results: MR 3D Recon At Scanner  Result Date: 03/13/2021 CLINICAL DATA:  40 year old female with history of abdominal pain. History of pancreatitis. Suspected cholelithiasis. EXAM: MRI ABDOMEN WITHOUT AND WITH CONTRAST (INCLUDING MRCP) TECHNIQUE: Multiplanar multisequence MR imaging of the abdomen was performed both before and after the administration of intravenous contrast. Heavily T2-weighted images of the biliary and pancreatic ducts were obtained, and three-dimensional MRCP images were rendered by post processing. CONTRAST:  31mL GADAVIST GADOBUTROL 1 MMOL/ML IV SOLN COMPARISON:  No prior abdominal MRI. CT the abdomen and pelvis 03/11/2021. FINDINGS: Lower chest: Unremarkable. Hepatobiliary: No suspicious cystic  or solid hepatic lesions. No intrahepatic biliary ductal dilatation noted on MRCP images. Common bile duct measures 6 mm in the porta hepatis. No filling defect in the common bile duct to suggest choledocholithiasis. There are innumerable filling defects within the dependent portion of the gallbladder, compatible with innumerable small gallstones. Gallbladder is moderately distended. Gallbladder wall does not appear thickened. No pericholecystic fluid or surrounding inflammatory changes to indicate an acute diverticulitis at this time. Pancreas: Pancreatic parenchyma enhances normally. However, there are extensive peripancreatic fluid collections and there is a trace amount of peripancreatic fluid which does not appear well organized at this time (i.e., no pseudocyst noted), indicative of acute pancreatitis. No pancreatic ductal dilatation noted on MRCP images. Spleen:  Unremarkable. Adrenals/Urinary Tract: Bilateral kidneys and adrenal glands are normal in appearance. No hydroureteronephrosis noted in the visualized portions of the abdomen. Stomach/Bowel: Visualized portions are unremarkable. Vascular/Lymphatic: No aneurysm identified in the visualized abdominal vasculature. No lymphadenopathy noted in the abdomen. Other: Trace amount of peripancreatic fluid and inflammatory changes. No significant volume of ascites noted in the visualized portions of the peritoneal cavity. Musculoskeletal: No aggressive appearing osseous lesions are noted in the visualized portions of the skeleton. IMPRESSION: 1. Acute pancreatitis redemonstrated. No evidence of pancreatic necrosis. There are peripancreatic inflammatory changes without a well-defined pseudocyst at this time. 2. Study is positive for cholelithiasis, with no findings to clearly indicate an acute cholecystitis at this time. 3. No choledocholithiasis or signs of acute biliary tract obstruction. Electronically Signed   By: Vinnie Langton M.D.   On: 03/13/2021 12:59    MR ABDOMEN MRCP  W WO CONTAST  Result Date: 03/13/2021 CLINICAL DATA:  40 year old female with history of abdominal pain. History of pancreatitis. Suspected cholelithiasis. EXAM: MRI ABDOMEN WITHOUT AND WITH CONTRAST (INCLUDING MRCP) TECHNIQUE: Multiplanar multisequence MR imaging of the abdomen was performed both before and after the administration of intravenous contrast. Heavily T2-weighted images of the biliary and pancreatic ducts were obtained, and three-dimensional MRCP images were rendered by post processing. CONTRAST:  59mL GADAVIST GADOBUTROL 1 MMOL/ML IV SOLN COMPARISON:  No prior abdominal MRI. CT the abdomen and pelvis 03/11/2021. FINDINGS: Lower chest: Unremarkable. Hepatobiliary: No suspicious cystic or solid hepatic lesions. No intrahepatic biliary ductal dilatation noted on MRCP images. Common bile duct measures 6 mm in the porta hepatis. No filling defect in the common bile duct to suggest choledocholithiasis. There are innumerable filling defects within the dependent portion of the gallbladder, compatible with innumerable small gallstones. Gallbladder is moderately distended. Gallbladder wall does not appear thickened. No pericholecystic fluid or surrounding inflammatory changes to indicate an acute diverticulitis at this time. Pancreas: Pancreatic parenchyma enhances normally. However, there are extensive peripancreatic fluid collections and there is a trace amount of peripancreatic fluid which does not appear well organized at this time (i.e., no pseudocyst noted), indicative of acute pancreatitis. No pancreatic ductal dilatation noted on MRCP images. Spleen:  Unremarkable. Adrenals/Urinary Tract: Bilateral kidneys and adrenal glands are normal in appearance. No hydroureteronephrosis noted in the visualized portions of the abdomen. Stomach/Bowel: Visualized portions are unremarkable. Vascular/Lymphatic: No aneurysm identified in the visualized abdominal vasculature. No lymphadenopathy noted  in the abdomen. Other: Trace amount of peripancreatic fluid and inflammatory changes. No significant volume of ascites noted in the visualized portions of the peritoneal cavity. Musculoskeletal: No aggressive appearing osseous lesions are noted in the visualized portions of the skeleton. IMPRESSION: 1. Acute pancreatitis redemonstrated. No evidence of pancreatic necrosis. There are peripancreatic inflammatory changes without a well-defined pseudocyst at this time. 2. Study is positive for cholelithiasis, with no findings to clearly indicate an acute cholecystitis at this time. 3. No choledocholithiasis or signs of acute biliary tract obstruction. Electronically Signed   By: Vinnie Langton M.D.   On: 03/13/2021 12:59    Anti-infectives: Anti-infectives (From admission, onward)    Start     Dose/Rate Route Frequency Ordered Stop   03/14/21 0600  cefTRIAXone (ROCEPHIN) 1 g in sodium chloride 0.9 % 100 mL IVPB        1 g 200 mL/hr over 30 Minutes Intravenous On call to O.R. 03/13/21 1137 03/15/21 0808       Assessment/Plan: s/p Procedure(s) with comments: LAPAROSCOPIC CHOLECYSTECTOMY  WITH ICG DYE (N/A) - LDOW Advance diet If she tolerates diet then plan for d/c later today Ambulate POD 1  LOS: 3 days    Autumn Messing III 03/15/2021

## 2021-03-16 ENCOUNTER — Telehealth: Payer: Self-pay

## 2021-03-16 ENCOUNTER — Other Ambulatory Visit: Payer: Self-pay | Admitting: Internal Medicine

## 2021-03-16 DIAGNOSIS — D519 Vitamin B12 deficiency anemia, unspecified: Secondary | ICD-10-CM

## 2021-03-16 MED ORDER — VITAMIN B-12 1000 MCG PO TABS: 1000.0000 ug | ORAL_TABLET | Freq: Every day | ORAL | 0 refills | Status: DC

## 2021-03-16 NOTE — Discharge Summary (Addendum)
Physician Discharge Summary   Patient name: Holly Graham  Admit date:     03/11/2021  Discharge date: 03/15/2021  Discharge Physician: Berle Mull   PCP: [name], NP   Recommendations at discharge: follow up with General surgery as recommended  Discharge Diagnoses Principal Problem:   Acute gallstone pancreatitis Active Problems:   Hyperbilirubinemia   Hypokalemia   Hypomagnesemia   Macrocytic anemia  Hospital Course   12/14 presented with abdominal pain found to have pancreatitis. 12/15 General surgery consulted. 12/16 MRCP negative.   12/17 underwent lap chole   * Acute gallstone pancreatitis Presents with 5-day history of abdominal pain radiating to her back with nausea and vomiting. CT scan shows evidence of pancreatitis lipase level elevated LFT normal. CT scan shows evidence of evidence of cholelithiasis without CBD dilation as well. Most likely this is gallstone pancreatitis. Patient denies any alcohol abuse, drug abuse track recently was normal.  General surgery consulted.  Appreciate assistance.  Underwent lap chole.  12/17. MRCP negative for any CBD stone. Pt was seen by General surgery and was discharged.  Hyperbilirubinemia MRCP negative for any CBD stone.  Hypokalemia Replaced.  Hypomagnesemia Replaced.  Macrocytic anemia Hemoglobin currently stable.  But on the lower side. MCV more than 100. Q65 low normal.  Folic acid and TSH normal.  We will supplement with B12.  B12 was sent as separate prescription.  Procedures performed: lap chole   Condition at discharge: good per General surgery   Disposition: Home  Discharge time: less than 30 minutes. Summary is for documentation. General surgery rounded on the pt and was discharged per surgery.    Follow-up Information     Rolm Bookbinder, MD Follow up in 3 week(s).   Specialty: General Surgery Contact information: [address] [place]  [address] [phone]                 Allergies as of 03/15/2021   No Known Allergies      Medication List     TAKE these medications    acetaminophen 500 MG tablet Commonly known as: TYLENOL Take 1,000 mg by mouth every 6 (six) hours as needed for mild pain.   GOODY HEADACHE PO Take 1 Package by mouth daily as needed (For headache).   oxyCODONE 5 MG immediate release tablet Commonly known as: Oxy IR/ROXICODONE Take 1 tablet (5 mg total) by mouth every 6 (six) hours as needed for moderate pain.        CT ABDOMEN PELVIS W CONTRAST  Result Date: 03/11/2021 CLINICAL DATA:  Abdominal pain, acute, nonlocalized. Epigastric pain EXAM: CT ABDOMEN AND PELVIS WITH CONTRAST TECHNIQUE: Multidetector CT imaging of the abdomen and pelvis was performed using the standard protocol following bolus administration of intravenous contrast. CONTRAST:  11mL OMNIPAQUE IOHEXOL 350 MG/ML SOLN COMPARISON:  01/05/2019 FINDINGS: Lower chest: No acute abnormality Hepatobiliary: No focal hepatic abnormality. Gallstones within the gallbladder. No visible wall thickening or biliary ductal dilatation. Pancreas: Stranding around the pancreas compatible with acute pancreatitis. No focal pancreatic lesion or ductal dilatation. Spleen: No focal abnormality.  Normal size. Adrenals/Urinary Tract: No adrenal abnormality. No focal renal abnormality. No stones or hydronephrosis. Urinary bladder is unremarkable. Stomach/Bowel: Normal appendix. Stomach, large and small bowel grossly unremarkable. Vascular/Lymphatic: No evidence of aneurysm or adenopathy. Left common iliac vein stent unchanged. Reproductive: Prior hysterectomy. Left ovarian cyst measuring 2.8 cm. Right ovary unremarkable. Other: None Musculoskeletal: No acute bony abnormality. IMPRESSION: Stranding around the pancreas compatible with acute  pancreatitis. Cholelithiasis. Electronically Signed   By: Rolm Baptise M.D.   On: 03/11/2021 18:40   MR 3D Recon At  Scanner  Result Date: 03/13/2021 CLINICAL DATA:  40 year old female with history of abdominal pain. History of pancreatitis. Suspected cholelithiasis. EXAM: MRI ABDOMEN WITHOUT AND WITH CONTRAST (INCLUDING MRCP) TECHNIQUE: Multiplanar multisequence MR imaging of the abdomen was performed both before and after the administration of intravenous contrast. Heavily T2-weighted images of the biliary and pancreatic ducts were obtained, and three-dimensional MRCP images were rendered by post processing. CONTRAST:  73mL GADAVIST GADOBUTROL 1 MMOL/ML IV SOLN COMPARISON:  No prior abdominal MRI. CT the abdomen and pelvis 03/11/2021. FINDINGS: Lower chest: Unremarkable. Hepatobiliary: No suspicious cystic or solid hepatic lesions. No intrahepatic biliary ductal dilatation noted on MRCP images. Common bile duct measures 6 mm in the porta hepatis. No filling defect in the common bile duct to suggest choledocholithiasis. There are innumerable filling defects within the dependent portion of the gallbladder, compatible with innumerable small gallstones. Gallbladder is moderately distended. Gallbladder wall does not appear thickened. No pericholecystic fluid or surrounding inflammatory changes to indicate an acute diverticulitis at this time. Pancreas: Pancreatic parenchyma enhances normally. However, there are extensive peripancreatic fluid collections and there is a trace amount of peripancreatic fluid which does not appear well organized at this time (i.e., no pseudocyst noted), indicative of acute pancreatitis. No pancreatic ductal dilatation noted on MRCP images. Spleen:  Unremarkable. Adrenals/Urinary Tract: Bilateral kidneys and adrenal glands are normal in appearance. No hydroureteronephrosis noted in the visualized portions of the abdomen. Stomach/Bowel: Visualized portions are unremarkable. Vascular/Lymphatic: No aneurysm identified in the visualized abdominal vasculature. No lymphadenopathy noted in the abdomen. Other:  Trace amount of peripancreatic fluid and inflammatory changes. No significant volume of ascites noted in the visualized portions of the peritoneal cavity. Musculoskeletal: No aggressive appearing osseous lesions are noted in the visualized portions of the skeleton. IMPRESSION: 1. Acute pancreatitis redemonstrated. No evidence of pancreatic necrosis. There are peripancreatic inflammatory changes without a well-defined pseudocyst at this time. 2. Study is positive for cholelithiasis, with no findings to clearly indicate an acute cholecystitis at this time. 3. No choledocholithiasis or signs of acute biliary tract obstruction. Electronically Signed   By: Vinnie Langton M.D.   On: 03/13/2021 12:59   MR ABDOMEN MRCP W WO CONTAST  Result Date: 03/13/2021 CLINICAL DATA:  40 year old female with history of abdominal pain. History of pancreatitis. Suspected cholelithiasis. EXAM: MRI ABDOMEN WITHOUT AND WITH CONTRAST (INCLUDING MRCP) TECHNIQUE: Multiplanar multisequence MR imaging of the abdomen was performed both before and after the administration of intravenous contrast. Heavily T2-weighted images of the biliary and pancreatic ducts were obtained, and three-dimensional MRCP images were rendered by post processing. CONTRAST:  16mL GADAVIST GADOBUTROL 1 MMOL/ML IV SOLN COMPARISON:  No prior abdominal MRI. CT the abdomen and pelvis 03/11/2021. FINDINGS: Lower chest: Unremarkable. Hepatobiliary: No suspicious cystic or solid hepatic lesions. No intrahepatic biliary ductal dilatation noted on MRCP images. Common bile duct measures 6 mm in the porta hepatis. No filling defect in the common bile duct to suggest choledocholithiasis. There are innumerable filling defects within the dependent portion of the gallbladder, compatible with innumerable small gallstones. Gallbladder is moderately distended. Gallbladder wall does not appear thickened. No pericholecystic fluid or surrounding inflammatory changes to indicate an acute  diverticulitis at this time. Pancreas: Pancreatic parenchyma enhances normally. However, there are extensive peripancreatic fluid collections and there is a trace amount of peripancreatic fluid which does not appear well organized at this time (  i.e., no pseudocyst noted), indicative of acute pancreatitis. No pancreatic ductal dilatation noted on MRCP images. Spleen:  Unremarkable. Adrenals/Urinary Tract: Bilateral kidneys and adrenal glands are normal in appearance. No hydroureteronephrosis noted in the visualized portions of the abdomen. Stomach/Bowel: Visualized portions are unremarkable. Vascular/Lymphatic: No aneurysm identified in the visualized abdominal vasculature. No lymphadenopathy noted in the abdomen. Other: Trace amount of peripancreatic fluid and inflammatory changes. No significant volume of ascites noted in the visualized portions of the peritoneal cavity. Musculoskeletal: No aggressive appearing osseous lesions are noted in the visualized portions of the skeleton. IMPRESSION: 1. Acute pancreatitis redemonstrated. No evidence of pancreatic necrosis. There are peripancreatic inflammatory changes without a well-defined pseudocyst at this time. 2. Study is positive for cholelithiasis, with no findings to clearly indicate an acute cholecystitis at this time. 3. No choledocholithiasis or signs of acute biliary tract obstruction. Electronically Signed   By: Vinnie Langton M.D.   On: 03/13/2021 12:59   Results for orders placed or performed during the hospital encounter of 03/11/21  Resp Panel by RT-PCR (Flu A&B, Covid) Nasopharyngeal Swab     Status: None   Collection Time: 03/11/21  9:06 PM   Specimen: Nasopharyngeal Swab; Nasopharyngeal(NP) swabs in vial transport medium  Result Value Ref Range Status   SARS Coronavirus 2 by RT PCR NEGATIVE NEGATIVE Final    Comment: (NOTE) SARS-CoV-2 target nucleic acids are NOT DETECTED.  The SARS-CoV-2 RNA is generally detectable in upper  respiratory specimens during the acute phase of infection. The lowest concentration of SARS-CoV-2 viral copies this assay can detect is 138 copies/mL. A negative result does not preclude SARS-Cov-2 infection and should not be used as the sole basis for treatment or other patient management decisions. A negative result may occur with  improper specimen collection/handling, submission of specimen other than nasopharyngeal swab, presence of viral mutation(s) within the areas targeted by this assay, and inadequate number of viral copies(<138 copies/mL). A negative result must be combined with clinical observations, patient history, and epidemiological information. The expected result is Negative.  Fact Sheet for Patients:  EntrepreneurPulse.com.au  Fact Sheet for Healthcare Providers:  IncredibleEmployment.be  This test is no t yet approved or cleared by the [place] FDA and  has been authorized for detection and/or diagnosis of SARS-CoV-2 by FDA under an Emergency Use Authorization (EUA). This EUA will remain  in effect (meaning this test can be used) for the duration of the COVID-19 declaration under Section 564(b)(1) of the Act, 21 U.S.C.section 360bbb-3(b)(1), unless the authorization is terminated  or revoked sooner.       Influenza A by PCR NEGATIVE NEGATIVE Final   Influenza B by PCR NEGATIVE NEGATIVE Final    Comment: (NOTE) The Xpert Xpress SARS-CoV-2/FLU/RSV plus assay is intended as an aid in the diagnosis of influenza from Nasopharyngeal swab specimens and should not be used as a sole basis for treatment. Nasal washings and aspirates are unacceptable for Xpert Xpress SARS-CoV-2/FLU/RSV testing.  Fact Sheet for Patients: EntrepreneurPulse.com.au  Fact Sheet for Healthcare Providers: IncredibleEmployment.be  This test is not yet approved or cleared by the [place] FDA and has been  authorized for detection and/or diagnosis of SARS-CoV-2 by FDA under an Emergency Use Authorization (EUA). This EUA will remain in effect (meaning this test can be used) for the duration of the COVID-19 declaration under Section 564(b)(1) of the Act, 21 U.S.C. section 360bbb-3(b)(1), unless the authorization is terminated or revoked.  Performed at [facility], [address] [place],  [place] [address]   Surgical PCR screen     Status: None   Collection Time: 03/12/21  4:15 PM   Specimen: Nasal Mucosa; Nasal Swab  Result Value Ref Range Status   MRSA, PCR NEGATIVE NEGATIVE Final   Staphylococcus aureus NEGATIVE NEGATIVE Final    Comment: (NOTE) The Xpert SA Assay (FDA approved for NASAL specimens in patients 33 years of age and older), is one component of a comprehensive surveillance program. It is not intended to diagnose infection nor to guide or monitor treatment. Performed at [facility], [place] [address], [place], [place] [address]    Signed:  Berle Mull MD.  [facility] 5:33 PM

## 2021-03-16 NOTE — Progress Notes (Signed)
error 

## 2021-03-16 NOTE — Telephone Encounter (Signed)
Transition Care Management Unsuccessful Follow-up Telephone Call  Date of discharge and from where:  03/15/2021, [facility]   Attempts:  1st Attempt  Reason for unsuccessful TCM follow-up call:  Left voice message on # [phone].  Call back requested.    Juluis Mire, NP is listed as patient's PCP but the patient has not seen Ms Oletta Lamas, NP for over 2 years. Need to confirm if patient plans to follow up with Ms Oletta Lamas, NP or if she has a new PCP

## 2021-03-17 ENCOUNTER — Telehealth: Payer: Self-pay

## 2021-03-17 LAB — SURGICAL PATHOLOGY

## 2021-03-17 NOTE — Telephone Encounter (Signed)
Transition Care Management Follow-up Telephone Call Date of discharge and from where: 03/15/2021, [facility]  How have you been since you were released from the hospital? She said she is just tired and is taking her medications.  Any questions or concerns? No  Items Reviewed: Did the pt receive and understand the discharge instructions provided? Yes  Medications obtained and verified? Yes  - she said she has her medications.  Other? No  Any new allergies since your discharge? No  Do you have support at home? Yes   Home Care and Equipment/Supplies: Were home health services ordered? no If so, what is the name of the agency? N/a  Has the agency set up a time to come to the patient's home? not applicable Were any new equipment or medical supplies ordered?  No What is the name of the medical supply agency? N/a Were you able to get the supplies/equipment? not applicable Do you have any questions related to the use of the equipment or supplies? No  Functional Questionnaire: (I = Independent and D = Dependent) ADLs: independent.    Follow up appointments reviewed:  PCP Hospital f/u appt confirmed?  She said she will call to schedule an appointment and Ms Oletta Lamas, NP is still her PCP.    [facility] f/u appt confirmed? Yes  Scheduled to see [facility] - 03/18/2021 and her surgeon 04/02/2021. Are transportation arrangements needed? No  If their condition worsens, is the pt aware to call PCP or go to the Emergency Dept.? Yes Was the patient provided with contact information for the PCP's office or ED? Yes Was to pt encouraged to call back with questions or concerns? Yes

## 2021-03-18 ENCOUNTER — Ambulatory Visit (HOSPITAL_COMMUNITY)
Admission: RE | Admit: 2021-03-18 | Discharge: 2021-03-18 | Disposition: A | Discharge status: Home / Self Care | Payer: BC Managed Care – PPO | Source: Ambulatory Visit | Attending: Vascular Surgery | Admitting: Vascular Surgery

## 2021-03-18 ENCOUNTER — Other Ambulatory Visit: Payer: Self-pay

## 2021-03-18 ENCOUNTER — Ambulatory Visit (INDEPENDENT_AMBULATORY_CARE_PROVIDER_SITE_OTHER): Payer: BC Managed Care – PPO | Admitting: Physician Assistant

## 2021-03-18 VITALS — BP 140/99 | HR 75 | Temp 98.2°F | Resp 20 | Ht 63.0 in | Wt 141.5 lb

## 2021-03-18 DIAGNOSIS — I871 Compression of vein: Secondary | ICD-10-CM

## 2021-03-18 NOTE — Progress Notes (Signed)
HISTORY AND PHYSICAL     CC:  follow up Requesting Provider:  [name], NP  HPI: Holly Graham is a 40 y.o. 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  Her procedure was performed on 05/15/18.  She stopped the Xarelto 6 months after the stents were placed as directed by our office.  She did have hysterectomy as well.   She comes in today and is doing well.  Her leg swelling is much improved.  She states she has not been taking her aspirin.  She had a cholecystectomy last weekend and has some soreness from this.    The pt is not on a statin for cholesterol management.  The pt is not on a daily aspirin.   Other AC:  none The pt is not on medication for hypertension.   The pt is not diabetic.   Tobacco hx:  never  Pt does not have family hx of AAA.  Past Medical History:  Diagnosis Date   DVT (deep venous thrombosis) ([place])    Fibroids 05/11/2018   History of blood transfusion    Iron deficiency anemia    Pulmonary emboli ([facility])    Bilateral    Past Surgical History:  Procedure Laterality Date   ABDOMINAL HYSTERECTOMY     CHOLECYSTECTOMY N/A 03/14/2021   Procedure: LAPAROSCOPIC CHOLECYSTECTOMY  WITH ICG DYE;  Surgeon: Rolm Bookbinder, MD;  Location: [facility];  Service: General;  Laterality: N/A;  LDOW   DILATION AND CURETTAGE OF UTERUS     INTRAVASCULAR ULTRASOUND/IVUS N/A 05/15/2018   Procedure: INTRAVASCULAR ULTRASOUND/IVUS;  Surgeon: Waynetta Sandy, MD;  Location: [place] [facility];  Service: Cardiovascular;  Laterality: N/A;   PERIPHERAL VASCULAR THROMBECTOMY N/A 05/15/2018   Procedure: PERIPHERAL VASCULAR THROMBECTOMY;  Surgeon: Waynetta Sandy, MD;  Location: [place] [facility];  Service: Cardiovascular;  Laterality: N/A;   ROBOTIC ASSISTED TOTAL HYSTERECTOMY WITH BILATERAL SALPINGO OOPHERECTOMY N/A  11/21/2018   Procedure: XI ROBOTIC ASSISTED TOTAL HYSTERECTOMY WITH BILATERAL SALPINGECTOMY GREATER THAN 250 GRAMS;  Surgeon: Everitt Amber, MD;  Location: [facility];  Service: Gynecology;  Laterality: N/A;   TUBAL LIGATION      Social History   Socioeconomic History   Marital status: Single    Spouse name: Not on file   Number of children: Not on file   Years of education: Not on file   Highest education level: Not on file  Occupational History   Not on file  Tobacco Use   Smoking status: Never   Smokeless tobacco: Never  Vaping Use   Vaping Use: Never used  Substance and Sexual Activity   Alcohol use: Yes   Drug use: Never   Sexual activity: Yes    Birth control/protection: Injection  Other Topics Concern   Not on file  Social History Narrative   Not on file   Social Determinants of Health   Financial Resource Strain: Not on file  Food Insecurity: Not on file  Transportation Needs: Not on file  Physical Activity: Not on file  Stress: Not on file  Social Connections: Not on file  Intimate Partner Violence: Not on file     Family History  Problem Relation Age of Onset   Pulmonary embolism Brother    Diabetes Maternal Grandmother     Current Outpatient Medications  Medication Sig Dispense Refill   acetaminophen (  TYLENOL) 500 MG tablet Take 1,000 mg by mouth every 6 (six) hours as needed for mild pain.     Aspirin-Acetaminophen-Caffeine (GOODY HEADACHE PO) Take 1 Package by mouth daily as needed (For headache).     oxyCODONE (OXY IR/ROXICODONE) 5 MG immediate release tablet Take 1 tablet (5 mg total) by mouth every 6 (six) hours as needed for moderate pain. 15 tablet 0   vitamin B-12 (CYANOCOBALAMIN) 1000 MCG tablet Take 1 tablet (1,000 mcg total) by mouth daily. (Patient not taking: Reported on 03/18/2021) 30 tablet 0   No current facility-administered medications for this visit.    No Known Allergies   REVIEW OF SYSTEMS:   [X]  denotes positive finding, [ ]   denotes negative finding Cardiac  Comments:  Chest pain or chest pressure:    Shortness of breath upon exertion:    Short of breath when lying flat:    Irregular heart rhythm:        Vascular    Pain in calf, thigh, or hip brought on by ambulation:    Pain in feet at night that wakes you up from your sleep:     Blood clot in your veins:    Leg swelling:         Pulmonary    Oxygen at home:    Productive cough:     Wheezing:         Neurologic    Sudden weakness in arms or legs:     Sudden numbness in arms or legs:     Sudden onset of difficulty speaking or slurred speech:    Temporary loss of vision in one eye:     Problems with dizziness:         Gastrointestinal    Blood in stool:     Vomited blood:         Genitourinary    Burning when urinating:     Blood in urine:        Psychiatric    Major depression:         Hematologic    Bleeding problems:    Problems with blood clotting too easily:        Skin    Rashes or ulcers:        Constitutional    Fever or chills:      PHYSICAL EXAMINATION:  Today's Vitals   03/18/21 0839  BP: (!) 140/99  Pulse: 75  Resp: 20  Temp: 98.2 F (36.8 C)  TempSrc: Temporal  SpO2: 99%  Weight: 141 lb 8 oz (64.2 kg)  Height: 5\' 3"  (1.6 m)   Body mass index is 25.07 kg/m.   General:  WDWN in NAD; vital signs documented above Gait: Not observed HENT: WNL, normocephalic Pulmonary: normal non-labored breathing Cardiac: regular HR, without carotid bruits Abdomen: abdomen with incisional soreness to palpation Skin: without rashes Vascular Exam/Pulses:  Right Left  Radial 2+ (normal) 2+ (normal)  DP 2+ (normal) 2+ (normal)   Extremities: without ischemic changes, without Gangrene , without cellulitis; without open wounds;  Musculoskeletal: no muscle wasting or atrophy  Neurologic: A&O X 3;  No focal weakness or paresthesias are detected; speech fluent/normal Psychiatric:  The pt has Normal affect.   Non-Invasive  Vascular Imaging on 03/18/2021:   IVC/Iliac Findings:  +----------+------+--------+--------+      IVC     Patent Thrombus Comments   +----------+------+--------+--------+   IVC Prox   patent                     +----------+------+--------+--------+  IVC Mid    patent                     +----------+------+--------+--------+   IVC Distal patent                     +----------+------+--------+--------+     +-----------------+---------+-----------+---------+-----------+------------          CIV        RT-Patent RT-Thrombus LT-Patent LT-Thrombus       Comments       +-----------------+---------+-----------+---------+-----------+------------   Common Iliac Prox  patent                patent                 patent left stent    +-----------------+---------+-----------+---------+-----------+------------   Common Iliac Mid   patent                patent                 patent left stent    +-----------------+---------+-----------+---------+-----------+------------   Common Iliac       patent                patent                 patent left Distal stent    +-----------------+---------+-----------+---------+-----------+------------   +-------------------------+---------+-----------+---------+-----------+----             EIV             RT-Patent RT-Thrombus LT-Patent LT-Thrombus Comments   +-------------------------+---------+-----------+---------+-----------+----    External Iliac Vein Prox                         patent                     +-------------------------+---------+-----------+---------+-----------+----   External Iliac Vein Mid                          patent                     +-------------------------+---------+-----------+---------+-----------+----   External Iliac Vein                              patent    Distal                       +-------------------------+---------+-----------+---------+-----------+----   Summary:  IVC/Iliac: Patent IVC, Right common iliac, Left  common iliac stent and left external iliac veins.     ASSESSMENT/PLAN:: 40 y.o. 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  Leg swelling resolved. -pt has not been taking her aspirin-I advised her to restart this back when general surgery are ok with her starting it after cholecystectomy.  She expressed understanding -f/u in one year with IVC/iliac vein duplex   Leontine Locket, [facility] [phone]  Clinic MD:   Donzetta Matters

## 2021-04-07 ENCOUNTER — Other Ambulatory Visit: Payer: Self-pay

## 2021-04-07 ENCOUNTER — Encounter (INDEPENDENT_AMBULATORY_CARE_PROVIDER_SITE_OTHER): Payer: Self-pay | Admitting: Primary Care

## 2021-04-07 ENCOUNTER — Ambulatory Visit (INDEPENDENT_AMBULATORY_CARE_PROVIDER_SITE_OTHER): Payer: BC Managed Care – PPO | Admitting: Primary Care

## 2021-04-07 VITALS — BP 160/94 | HR 96 | Temp 97.8°F | Ht 63.0 in | Wt 141.0 lb

## 2021-04-07 DIAGNOSIS — Z131 Encounter for screening for diabetes mellitus: Secondary | ICD-10-CM | POA: Diagnosis not present

## 2021-04-07 DIAGNOSIS — Z09 Encounter for follow-up examination after completed treatment for conditions other than malignant neoplasm: Secondary | ICD-10-CM

## 2021-04-07 DIAGNOSIS — D500 Iron deficiency anemia secondary to blood loss (chronic): Secondary | ICD-10-CM | POA: Diagnosis not present

## 2021-04-07 DIAGNOSIS — I10 Essential (primary) hypertension: Secondary | ICD-10-CM | POA: Diagnosis not present

## 2021-04-07 DIAGNOSIS — R7989 Other specified abnormal findings of blood chemistry: Secondary | ICD-10-CM

## 2021-04-07 LAB — POCT GLYCOSYLATED HEMOGLOBIN (HGB A1C): Hemoglobin A1C: 5 % (ref 4.0–5.6)

## 2021-04-07 MED ORDER — AMLODIPINE BESYLATE 10 MG PO TABS: 10.0000 mg | ORAL_TABLET | Freq: Every day | ORAL | 0 refills | Status: DC

## 2021-04-07 NOTE — Progress Notes (Signed)
[facility]   Subjective:   Holly Graham is a 41 y.o. female presents for hospital follow up. Patient presented to the emergency room on 03/11/2021 after  3 days of severe epigastric pain.  The pain was constant and sharpe and not improving.  She also has some bouts of nausea vomiting diarrhea constipation and no appetite. She was admitted to hospital  03/11/21, patient was discharged from the hospital on 03/15/21, patient was admitted for: Acute pancreatitis surgical intervention removal of emergent laparoscopic cholecystectomy.She is doing well today voices no concerns or problems.   Past Medical History:  Diagnosis Date   DVT (deep venous thrombosis) (HCC)    Fibroids 05/11/2018   History of blood transfusion    Iron deficiency anemia    Pulmonary emboli (HCC)    Bilateral     No Known Allergies    Current Outpatient Medications on File Prior to Visit  Medication Sig Dispense Refill   acetaminophen (TYLENOL) 500 MG tablet Take 1,000 mg by mouth every 6 (six) hours as needed for mild pain.     No current facility-administered medications on file prior to visit.     Review of System: Review of Systems  All other systems reviewed and are negative.   Objective:  BP (!) 160/94 (BP Location: Right Arm, Patient Position: Sitting, Cuff Size: Normal)    Pulse 96    Temp 97.8 F (36.6 C) (Oral)    Ht 5\' 3"  (1.6 m)    Wt 141 lb (64 kg)    LMP 08/21/2018    SpO2 99%    BMI 24.98 kg/m   Filed Weights   04/07/21 1104  Weight: 141 lb (64 kg)   Physical Exam: General Appearance: Well nourished, in no apparent distress. Eyes: PERRLA, EOMs, conjunctiva no swelling or erythema Sinuses: No Frontal/maxillary tenderness ENT/Mouth: Ext aud canals clear, TMs without erythema, bulging. Hearing normal.  Neck: Supple, thyroid normal.  Respiratory: Respiratory effort normal, BS equal bilaterally without rales, rhonchi, wheezing or stridor.  Cardio: RRR with no MRGs.  Brisk peripheral pulses without edema.  Abdomen: Soft, + BS.  Non tender, no guarding, rebound, hernias, masses. Lymphatics: Non tender without lymphadenopathy.  Musculoskeletal: Full ROM, 5/5 strength, normal gait.  Skin: Warm, dry without rashes, lesions, ecchymosis.  Neuro: Cranial nerves intact. Normal muscle tone, no cerebellar symptoms. Sensation intact.  Psych: Awake and oriented X 3, normal affect, Insight and Judgment appropriate.    Assessment:  Shyasia was seen today for hospitalization follow-up.  Diagnoses and all orders for this visit:  Essential hypertension Reviewed previous Bp elevated Counseled on blood pressure goal of less than 130/80, low-sodium, DASH diet, medication compliance, 150 minutes of moderate intensity exercise per week. Discussed prescribing amlodipine 10mg  daily medication compliance, adverse effects.   Screening for diabetes mellitus -     HgB A1c 5.0   Anemia due to chronic blood loss -     CBC with Differential  Elevated serum free T4 level -     TSH + free T4; Future  Hospital discharge follow-up Acute pancreatitis surgical intervention removal of emergent laparoscopic cholecystectomy Seen General surgery on 03/18/21 doing well and healing appropriately without signs of infection. Pathology report reviewed with patient. Activities as tolerated.    Other orders -     amLODipine (NORVASC) 10 MG tablet; Take 1 tablet (10 mg total) by mouth daily.     This note has been created with Surveyor, quantity. Any transcriptional  errors are unintentional.   Holly Perna, NP 04/07/2021, 11:25 AM

## 2021-04-07 NOTE — Patient Instructions (Signed)
Amlodipine Tablets What is this medication? AMLODIPINE (am LOE di peen) treats high blood pressure and prevents chest pain (angina). It works by relaxing the blood vessels, which helps decrease the amount of work your heart has to do. It belongs to a group of medications called calcium channel blockers. This medicine may be used for other purposes; ask your health care provider or pharmacist if you have questions. COMMON BRAND NAME(S): Norvasc What should I tell my care team before I take this medication? They need to know if you have any of these conditions: Heart disease Liver disease An unusual or allergic reaction to amlodipine, other medications, foods, dyes, or preservatives Pregnant or trying to get pregnant Breast-feeding How should I use this medication? Take this medication by mouth. Take it as directed on the prescription label at the same time every day. You can take it with or without food. If it upsets your stomach, take it with food. Keep taking it unless your care team tells you to stop. Talk to your care team about the use of this medication in children. While it may be prescribed for children as young as 6 for selected conditions, precautions do apply. Overdosage: If you think you have taken too much of this medicine contact a poison control center or emergency room at once. NOTE: This medicine is only for you. Do not share this medicine with others. What if I miss a dose? If you miss a dose, take it as soon as you can. If it is almost time for your next dose, take only that dose. Do not take double or extra doses. What may interact with this medication? Clarithromycin Cyclosporine Diltiazem Itraconazole Simvastatin Tacrolimus This list may not describe all possible interactions. Give your health care provider a list of all the medicines, herbs, non-prescription drugs, or dietary supplements you use. Also tell them if you smoke, drink alcohol, or use illegal drugs. Some  items may interact with your medicine. What should I watch for while using this medication? Visit your health care provider for regular checks on your progress. Check your blood pressure as directed. Ask your health care provider what your blood pressure should be. Also, find out when you should contact him or her. Do not treat yourself for coughs, colds, or pain while you are using this medication without asking your health care provider for advice. Some medications may increase your blood pressure. You may get drowsy or dizzy. Do not drive, use machinery, or do anything that needs mental alertness until you know how this medication affects you. Do not stand up or sit up quickly, especially if you are an older patient. This reduces the risk of dizzy or fainting spells. Alcohol can make you more drowsy and dizzy. Avoid alcoholic drinks. What side effects may I notice from receiving this medication? Side effects that you should report to your care team as soon as possible: Allergic reactions--skin rash, itching, hives, swelling of the face, lips, tongue, or throat Heart attack--pain or tightness in the chest, shoulders, arms, or jaw, nausea, shortness of breath, cold or clammy skin, feeling faint or lightheaded Low blood pressure--dizziness, feeling faint or lightheaded, blurry vision Side effects that usually do not require medical attention (report these to your care team if they continue or are bothersome): Facial flushing, redness Heart palpitations--rapid, pounding, or irregular heartbeat Nausea Stomach pain Swelling of the ankles, hands, or feet This list may not describe all possible side effects. Call your doctor for medical advice about side  effects. You may report side effects to FDA at 1-800-FDA-1088. Where should I keep my medication? Keep out of the reach of children and pets. Store at room temperature between 20 and 25 degrees C (68 and 77 degrees F). Protect from light and moisture.  Keep the container tightly closed. Get rid of any unused medication after the expiration date. To get rid of medications that are no longer needed or have expired: Take the medication to a medication take-back program. Check with your pharmacy or law enforcement to find a location. If you cannot return the medication, check the label or package insert to see if the medication should be thrown out in the garbage or flushed down the toilet. If you are not sure, ask your health care provider. If it is safe to put in the trash, empty the medication out of the container. Mix the medication with cat litter, dirt, coffee grounds, or other unwanted substance. Seal the mixture in a bag or container. Put it in the trash. NOTE: This sheet is a summary. It may not cover all possible information. If you have questions about this medicine, talk to your doctor, pharmacist, or health care provider.  2022 Elsevier/Gold Standard (2020-12-02 00:00:00)

## 2021-04-28 ENCOUNTER — Ambulatory Visit (INDEPENDENT_AMBULATORY_CARE_PROVIDER_SITE_OTHER): Payer: BC Managed Care – PPO | Admitting: Primary Care

## 2021-04-28 ENCOUNTER — Encounter (INDEPENDENT_AMBULATORY_CARE_PROVIDER_SITE_OTHER): Payer: Self-pay | Admitting: Primary Care

## 2021-04-28 ENCOUNTER — Other Ambulatory Visit: Payer: Self-pay

## 2021-04-28 VITALS — BP 131/89 | HR 102 | Temp 97.5°F | Ht 63.0 in | Wt 133.4 lb

## 2021-04-28 DIAGNOSIS — D500 Iron deficiency anemia secondary to blood loss (chronic): Secondary | ICD-10-CM

## 2021-04-28 DIAGNOSIS — Z13220 Encounter for screening for lipoid disorders: Secondary | ICD-10-CM

## 2021-04-28 DIAGNOSIS — R7989 Other specified abnormal findings of blood chemistry: Secondary | ICD-10-CM

## 2021-04-28 DIAGNOSIS — I10 Essential (primary) hypertension: Secondary | ICD-10-CM | POA: Diagnosis not present

## 2021-04-28 NOTE — Progress Notes (Signed)
Pt is fasting and has not taken amlodipine this morning

## 2021-04-28 NOTE — Progress Notes (Signed)
[place]   Ms. Holly Graham is a 41 y.o. female presents for hypertension evaluation, Denies shortness of breath, headaches, chest pain or lower extremity edema, sudden onset, vision changes, unilateral weakness, dizziness, paresthesias. Continues to have n/v and diarrhea .   Patient reports adherence with medications.  Dietary habits include: monitoring sodium intake, reading labels Exercise habits include:yes Family / Social history: PE brother and mother DM   Past Medical History:  Diagnosis Date   DVT (deep venous thrombosis) ([place])    Fibroids 05/11/2018   History of blood transfusion    Iron deficiency anemia    Pulmonary emboli (HCC)    Bilateral   Past Surgical History:  Procedure Laterality Date   ABDOMINAL HYSTERECTOMY     CHOLECYSTECTOMY N/A 03/14/2021   Procedure: LAPAROSCOPIC CHOLECYSTECTOMY  WITH ICG DYE;  Surgeon: Rolm Bookbinder, MD;  Location: [facility];  Service: General;  Laterality: N/A;  LDOW   DILATION AND CURETTAGE OF UTERUS     INTRAVASCULAR ULTRASOUND/IVUS N/A 05/15/2018   Procedure: INTRAVASCULAR ULTRASOUND/IVUS;  Surgeon: Waynetta Sandy, MD;  Location: [place] [facility];  Service: Cardiovascular;  Laterality: N/A;   PERIPHERAL VASCULAR THROMBECTOMY N/A 05/15/2018   Procedure: PERIPHERAL VASCULAR THROMBECTOMY;  Surgeon: Waynetta Sandy, MD;  Location: [place] [facility];  Service: Cardiovascular;  Laterality: N/A;   ROBOTIC ASSISTED TOTAL HYSTERECTOMY WITH BILATERAL SALPINGO OOPHERECTOMY N/A 11/21/2018   Procedure: XI ROBOTIC ASSISTED TOTAL HYSTERECTOMY WITH BILATERAL SALPINGECTOMY GREATER THAN 250 GRAMS;  Surgeon: Everitt Amber, MD;  Location: [facility];  Service: Gynecology;  Laterality: N/A;   TUBAL LIGATION     No Known Allergies Current Outpatient Medications on File Prior to Visit  Medication Sig Dispense Refill   amLODipine (NORVASC) 10 MG tablet Take 1 tablet (10 mg total) by mouth daily. 90 tablet 0    acetaminophen (TYLENOL) 500 MG tablet Take 1,000 mg by mouth every 6 (six) hours as needed for mild pain.     No current facility-administered medications on file prior to visit.   Social History   Socioeconomic History   Marital status: Single    Spouse name: Not on file   Number of children: Not on file   Years of education: Not on file   Highest education level: Not on file  Occupational History   Not on file  Tobacco Use   Smoking status: Never   Smokeless tobacco: Never  Vaping Use   Vaping Use: Never used  Substance and Sexual Activity   Alcohol use: Yes   Drug use: Never   Sexual activity: Yes    Birth control/protection: Injection  Other Topics Concern   Not on file  Social History Narrative   Not on file   Social Determinants of Health   Financial Resource Strain: Not on file  Food Insecurity: Not on file  Transportation Needs: Not on file  Physical Activity: Not on file  Stress: Not on file  Social Connections: Not on file  Intimate Partner Violence: Not on file   Family History  Problem Relation Age of Onset   Pulmonary embolism Brother    Diabetes Maternal Grandmother      OBJECTIVE:  Vitals:   04/28/21 1026  BP: 131/89  Pulse: (!) 102  Temp: (!) 97.5 F (36.4 C)  TempSrc: Oral  SpO2: 99%  Weight: 133 lb 6.4 oz (60.5 kg)  Height: 5\' 3"  (1.6 m)    Physical Exam General: No apparent distress.Underweight female Eyes: Extraocular eye movements intact, pupils  equal and round. Neck: Supple, trachea midline. Thyroid: No enlargement, mobile without fixation, no tenderness. Cardiovascular: Regular rhythm and rate, no murmur, normal radial pulses. Respiratory: Normal respiratory effort, clear to auscultation. Gastrointestinal: Normal pitch active bowel sounds, nontender abdomen without distention or appreciable hepatomegaly. Musculoskeletal: Normal muscle tone, no tenderness on palpation of tibia, no excessive thoracic kyphosis. Skin: Appropriate  warmth, no visible rash. Mental status: Alert, conversant, speech clear, thought logical, appropriate mood and affect, no hallucinations or delusions evident. Hematologic/lymphatic: No cervical adenopathy, no visible ecchymoses.   ROS Comprehensive ROS Pertinent positive and negative noted in HPI   Last 3 Office BP readings: BP Readings from Last 3 Encounters:  04/28/21 131/89  04/07/21 (!) 160/94  03/18/21 (!) 140/99    BMET    Component Value Date/Time   NA 131 (L) 03/15/2021 0444   K 3.9 03/15/2021 0444   CL 100 03/15/2021 0444   CO2 24 03/15/2021 0444   GLUCOSE 97 03/15/2021 0444   BUN <5 (L) 03/15/2021 0444   CREATININE 0.51 03/15/2021 0444   CALCIUM 8.6 (L) 03/15/2021 0444   GFRNONAA >60 03/15/2021 0444   GFRAA >60 12/13/2018 1448    Renal function: CrCl cannot be calculated (Patient's most recent lab result is older than the maximum 21 days allowed.).  Clinical ASCVD: Yes  The ASCVD Risk score (Arnett DK, et al., 2019) failed to calculate for the following reasons:   Cannot find a previous HDL lab   Cannot find a previous total cholesterol lab  ASCVD risk factors include- [place]   ASSESSMENT & PLAN: Holly Graham was seen today for blood pressure check.  Diagnoses and all orders for this visit:  Iron deficiency anemia due to chronic blood loss -     CBC with Differential  Elevated serum free T4 level Ck TSH/T4  Lipid screening -     Lipid Panel    Essential hypertension -Counseled on lifestyle modifications for blood pressure control including reduced dietary sodium, increased exercise, weight reduction and adequate sleep. Also, educated patient about the risk for cardiovascular events, stroke and heart attack. Also counseled patient about the importance of medication adherence. If you participate in smoking, it is important to stop using tobacco as this will increase the risks associated with uncontrolled blood pressure.   -Hypertension longstanding diagnosed  currently amlodipine 10mg  daily on current medications. Patient is adherent with current medications.   Goal BP:  For patients younger than 60: Goal BP < 130/80. For patients 60 and older: Goal BP < 140/90. For patients with diabetes: Goal BP < 130/80. Your most recent BP: 131/91  Minimize salt intake. Minimize alcohol intake    This note has been created with Surveyor, quantity. Any transcriptional errors are unintentional.   [name], NP 04/28/2021, 10:41 AM

## 2021-04-28 NOTE — Patient Instructions (Signed)
Iron rich foods such as shellfish,liver, organ meats(liver, gizzard), and red meats can increase cholesterol and should be consumed in moderation.However; legumes(beans), spinach, pumpkin seeds, turkey, broccoli, tofu, green leafy vegetables and dark chocolate can be consumed without concern to cholesterol.  °

## 2021-04-29 LAB — CBC WITH DIFFERENTIAL/PLATELET
Basophils Absolute: 0 10*3/uL (ref 0.0–0.2)
Basos: 0 %
EOS (ABSOLUTE): 0 10*3/uL (ref 0.0–0.4)
Eos: 1 %
Hematocrit: 35 % (ref 34.0–46.6)
Hemoglobin: 12.3 g/dL (ref 11.1–15.9)
Immature Grans (Abs): 0 10*3/uL (ref 0.0–0.1)
Immature Granulocytes: 0 %
Lymphocytes Absolute: 1.2 10*3/uL (ref 0.7–3.1)
Lymphs: 30 %
MCH: 36.8 pg — ABNORMAL HIGH (ref 26.6–33.0)
MCHC: 35.1 g/dL (ref 31.5–35.7)
MCV: 105 fL — ABNORMAL HIGH (ref 79–97)
Monocytes Absolute: 0.4 10*3/uL (ref 0.1–0.9)
Monocytes: 10 %
Neutrophils Absolute: 2.4 10*3/uL (ref 1.4–7.0)
Neutrophils: 59 %
Platelets: 230 10*3/uL (ref 150–450)
RBC: 3.34 x10E6/uL — ABNORMAL LOW (ref 3.77–5.28)
RDW: 14.4 % (ref 11.7–15.4)
WBC: 4.1 10*3/uL (ref 3.4–10.8)

## 2021-04-29 LAB — LIPID PANEL
Chol/HDL Ratio: 1.7 ratio (ref 0.0–4.4)
Cholesterol, Total: 255 mg/dL — ABNORMAL HIGH (ref 100–199)
HDL: 146 mg/dL (ref >39)
LDL Chol Calc (NIH): 98 mg/dL (ref 0–99)
Triglycerides: 67 mg/dL (ref 0–149)
VLDL Cholesterol Cal: 11 mg/dL (ref 5–40)

## 2021-04-30 LAB — TSH+FREE T4
Free T4: 1.27 ng/dL (ref 0.82–1.77)
TSH: 0.941 u[IU]/mL (ref 0.450–4.500)

## 2021-04-30 LAB — SPECIMEN STATUS REPORT

## 2021-06-03 DIAGNOSIS — F1099 Alcohol use, unspecified with unspecified alcohol-induced disorder: Secondary | ICD-10-CM | POA: Diagnosis not present

## 2021-06-17 DIAGNOSIS — F1099 Alcohol use, unspecified with unspecified alcohol-induced disorder: Secondary | ICD-10-CM | POA: Diagnosis not present

## 2021-06-17 DIAGNOSIS — F411 Generalized anxiety disorder: Secondary | ICD-10-CM | POA: Diagnosis not present

## 2021-07-06 ENCOUNTER — Other Ambulatory Visit: Payer: Self-pay

## 2021-07-06 ENCOUNTER — Encounter (HOSPITAL_COMMUNITY): Payer: Self-pay

## 2021-07-06 ENCOUNTER — Emergency Department (HOSPITAL_COMMUNITY)
Admission: EM | Admit: 2021-07-06 | Discharge: 2021-07-07 | Disposition: A | Discharge status: Home / Self Care | Payer: BC Managed Care – PPO | Attending: Emergency Medicine | Admitting: Emergency Medicine

## 2021-07-06 DIAGNOSIS — Z79899 Other long term (current) drug therapy: Secondary | ICD-10-CM | POA: Diagnosis not present

## 2021-07-06 DIAGNOSIS — L03115 Cellulitis of right lower limb: Secondary | ICD-10-CM | POA: Insufficient documentation

## 2021-07-06 DIAGNOSIS — R200 Anesthesia of skin: Secondary | ICD-10-CM | POA: Diagnosis present

## 2021-07-06 DIAGNOSIS — E876 Hypokalemia: Secondary | ICD-10-CM | POA: Diagnosis not present

## 2021-07-06 DIAGNOSIS — M79604 Pain in right leg: Secondary | ICD-10-CM

## 2021-07-06 DIAGNOSIS — I10 Essential (primary) hypertension: Secondary | ICD-10-CM | POA: Diagnosis not present

## 2021-07-06 DIAGNOSIS — R52 Pain, unspecified: Secondary | ICD-10-CM | POA: Diagnosis not present

## 2021-07-06 LAB — COMPREHENSIVE METABOLIC PANEL
ALT: 20 U/L (ref 0–44)
AST: 55 U/L — ABNORMAL HIGH (ref 15–41)
Albumin: 4.4 g/dL (ref 3.5–5.0)
Alkaline Phosphatase: 75 U/L (ref 38–126)
Anion gap: 13 (ref 5–15)
BUN: 8 mg/dL (ref 6–20)
CO2: 31 mmol/L (ref 22–32)
Calcium: 9.2 mg/dL (ref 8.9–10.3)
Chloride: 96 mmol/L — ABNORMAL LOW (ref 98–111)
Creatinine, Ser: 0.75 mg/dL (ref 0.44–1.00)
GFR, Estimated: >60 mL/min (ref >60)
Glucose, Bld: 94 mg/dL (ref 70–99)
Potassium: 2.5 mmol/L — CL (ref 3.5–5.1)
Sodium: 140 mmol/L (ref 135–145)
Total Bilirubin: 0.9 mg/dL (ref 0.3–1.2)
Total Protein: 7.9 g/dL (ref 6.5–8.1)

## 2021-07-06 LAB — CBC WITH DIFFERENTIAL/PLATELET
Abs Immature Granulocytes: 0.04 10*3/uL (ref 0.00–0.07)
Basophils Absolute: 0 10*3/uL (ref 0.0–0.1)
Basophils Relative: 0 %
Eosinophils Absolute: 0 10*3/uL (ref 0.0–0.5)
Eosinophils Relative: 1 %
HCT: 35.5 % — ABNORMAL LOW (ref 36.0–46.0)
Hemoglobin: 12.5 g/dL (ref 12.0–15.0)
Immature Granulocytes: 1 %
Lymphocytes Relative: 25 %
Lymphs Abs: 1.4 10*3/uL (ref 0.7–4.0)
MCH: 38.5 pg — ABNORMAL HIGH (ref 26.0–34.0)
MCHC: 35.2 g/dL (ref 30.0–36.0)
MCV: 109.2 fL — ABNORMAL HIGH (ref 80.0–100.0)
Monocytes Absolute: 0.6 10*3/uL (ref 0.1–1.0)
Monocytes Relative: 10 %
Neutro Abs: 3.7 10*3/uL (ref 1.7–7.7)
Neutrophils Relative %: 63 %
Platelets: 353 10*3/uL (ref 150–400)
RBC: 3.25 MIL/uL — ABNORMAL LOW (ref 3.87–5.11)
RDW: 16.5 % — ABNORMAL HIGH (ref 11.5–15.5)
WBC: 5.7 10*3/uL (ref 4.0–10.5)
nRBC: 0 % (ref 0.0–0.2)

## 2021-07-06 LAB — MAGNESIUM: Magnesium: 1.5 mg/dL — ABNORMAL LOW (ref 1.7–2.4)

## 2021-07-06 MED ORDER — POTASSIUM CHLORIDE CRYS ER 20 MEQ PO TBCR
40.0000 meq | EXTENDED_RELEASE_TABLET | Freq: Once | ORAL | Status: AC
  Administered 2021-07-06: 40 meq via ORAL
  Filled 2021-07-06: qty 2

## 2021-07-06 MED ORDER — POTASSIUM CHLORIDE CRYS ER 20 MEQ PO TBCR: 20.0000 meq | EXTENDED_RELEASE_TABLET | Freq: Two times a day (BID) | ORAL | 0 refills | Status: AC

## 2021-07-06 MED ORDER — MAGNESIUM SULFATE 2 GM/50ML IV SOLN
2.0000 g | Freq: Once | INTRAVENOUS | Status: AC
  Administered 2021-07-06: 2 g via INTRAVENOUS
  Filled 2021-07-06: qty 50

## 2021-07-06 MED ORDER — POTASSIUM CHLORIDE 10 MEQ/100ML IV SOLN
10.0000 meq | INTRAVENOUS | Status: AC
  Administered 2021-07-06 (×2): 10 meq via INTRAVENOUS
  Filled 2021-07-06 (×2): qty 100

## 2021-07-06 MED ORDER — OXYCODONE-ACETAMINOPHEN 5-325 MG PO TABS
1.0000 | ORAL_TABLET | Freq: Once | ORAL | Status: AC
  Administered 2021-07-06: 1 via ORAL
  Filled 2021-07-06: qty 1

## 2021-07-06 MED ORDER — MAGNESIUM SULFATE IN D5W 1-5 GM/100ML-% IV SOLN
1.0000 g | Freq: Once | INTRAVENOUS | Status: DC
Start: 2021-07-06 — End: 2021-07-06

## 2021-07-06 MED ORDER — CEFADROXIL 500 MG PO CAPS: 500.0000 mg | ORAL_CAPSULE | Freq: Two times a day (BID) | ORAL | 0 refills | Status: AC

## 2021-07-06 NOTE — ED Triage Notes (Signed)
Pt reports with right foot numbness, redness, and leg pain x 1 week. Pt reports having a hx of blood clots.  ?

## 2021-07-06 NOTE — ED Provider Notes (Signed)
AlsoAccepted handoff at shift change from [facility]. Please see prior provider note for more detail.  ? ?Briefly: Patient is 41 y.o.  ? ? ? ?Plan: Monitor patient while she is having potassium repletion done.  I did add on a magnesium level which was found to be low at 1.5.  Given that she is symptomatic we will replete with 2 g of mag sulfate IV. ? ? ? ?Physical Exam  ?BP (!) 127/93   Pulse 96   Temp (!) 97.4 ?F (36.3 ?C) (Oral)   Resp 17   Ht '5\' 3"'$  (1.6 m)   Wt 61.2 kg   LMP 08/21/2018   SpO2 100%   BMI 23.91 kg/m?  ? ?Physical Exam ?Vitals and nursing note reviewed.  ?Constitutional:   ?   General: She is not in acute distress. ?   Appearance: Normal appearance. She is not ill-appearing.  ?HENT:  ?   Head: Normocephalic and atraumatic.  ?Eyes:  ?   General: No scleral icterus.    ?   Right eye: No discharge.     ?   Left eye: No discharge.  ?   Conjunctiva/sclera: Conjunctivae normal.  ?Pulmonary:  ?   Effort: Pulmonary effort is normal.  ?   Breath sounds: No stridor.  ?Neurological:  ?   Mental Status: She is alert and oriented to person, place, and time. Mental status is at baseline.  ? ? ?Procedures  ?Procedures ? ?ED Course / MDM  ?  ?Medical Decision Making ?Amount and/or Complexity of Data Reviewed ?Labs: ordered. ? ?Risk ?Prescription drug management. ? ? ?Patient hypokalemic also hypomagnesemic.  Both of these electrolytes were repleted.  Prior provider plan is discharge home with outpatient follow-up.  We will follow through this plan.  Patient states some improvement after medication ministration. ? ? ?I reassessed patient.  She states she feels somewhat improved.  Understands need for close follow-up with PCP.  She has a history of hypokalemia.  History of hypomagnesemia.  Both of these were slightly repleted.  She is ambulatory, tolerating p.o., requesting discharge at this time.  We will discharge patient home with return precautions.  ? ?  ?Tedd Sias, [place] ?07/06/21 2344 ? ?   ?Shanon Rosser, MD ?07/07/21 0440 ? ?

## 2021-07-06 NOTE — ED Provider Notes (Signed)
?[facility] ?Provider Note ? ? ?CSN: [id number] ?Arrival date & time: 07/06/21  1910 ? ?  ? ?History ?Chief Complaint  ?Patient presents with  ? foot numbness  ? Leg Pain  ? ? ?Holly Graham is a 41 y.o. 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 She reports that for the past 3 days, she has had pain in her right foot with some erythema to the medial aspect. She is concerned she may be having another blood clot. She denies any chest pain or SOB. She reports some occasional lightheadedness for the past few weeks, but denies any leg swelling, syncope, palpitations, or headache. She denies any abdominal pain, nausea, or vomiting. No medications trialed.  ? ? ?Leg Pain ?Associated symptoms: no fever   ? ?  ? ?Home Medications ?Prior to Admission medications   ?Medication Sig Start Date End Date Taking? Authorizing Provider  ?cefadroxil (DURICEF) 500 MG capsule Take 1 capsule (500 mg total) by mouth 2 (two) times daily. 07/06/21  Yes Sherrell Puller, PA-C  ?potassium chloride SA (KLOR-CON M) 20 MEQ tablet Take 1 tablet (20 mEq total) by mouth 2 (two) times daily. 07/06/21  Yes Sherrell Puller, PA-C  ?acetaminophen (TYLENOL) 500 MG tablet Take 1,000 mg by mouth every 6 (six) hours as needed for mild pain.    [provider]  ?amLODipine (NORVASC) 10 MG tablet Take 1 tablet (10 mg total) by mouth daily. 04/07/21   [name], NP  ?   ? ?Allergies    ?Patient has no known allergies.   ? ?Review of Systems   ?Review of Systems  ?Constitutional:  Negative for chills and fever.  ?Respiratory:  Negative for shortness of breath.   ?Cardiovascular:  Negative for chest pain, palpitations and leg swelling.  ?Gastrointestinal:  Negative for abdominal pain, constipation, diarrhea, nausea and vomiting.  ?Musculoskeletal:  Positive for myalgias.  ?Skin:  Positive for color change.  ?Neurological:   Positive for light-headedness and numbness. Negative for dizziness, syncope, weakness and headaches.  ? ?Physical Exam ?Updated Vital Signs ?BP 114/69   Pulse 81   Temp (!) 97.4 ?F (36.3 ?C) (Oral)   Resp 18   Ht '5\' 3"'$  (1.6 m)   Wt 61.2 kg   LMP 08/21/2018   SpO2 92%   BMI 23.91 kg/m?  ?Physical Exam ?Vitals and nursing note reviewed.  ?Constitutional:   ?   General: She is not in acute distress. ?   Appearance: Normal appearance. She is not ill-appearing or toxic-appearing.  ?HENT:  ?   Head: Normocephalic and atraumatic.  ?   Mouth/Throat:  ?   Mouth: Mucous membranes are moist.  ?Eyes:  ?   General: No scleral icterus. ?Cardiovascular:  ?   Rate and Rhythm: Normal rate and regular rhythm.  ?Pulmonary:  ?   Effort: Pulmonary effort is normal. No respiratory distress.  ?Abdominal:  ?   General: Bowel sounds are normal.  ?   Palpations: Abdomen is soft.  ?   Tenderness: There is no abdominal tenderness. There is no guarding or rebound.  ?Musculoskeletal:     ?   General: Tenderness present. No swelling or deformity. Normal range of motion.  ?   Cervical back: Normal range of motion. No tenderness or bony tenderness.  ?   Thoracic back: No tenderness or bony tenderness.  ?   Lumbar back: No tenderness or  bony tenderness.  ?   Right lower leg: No edema.  ?   Left lower leg: No edema.  ?     Feet: ? ?   Comments: Mild erythema present in the medial right foot as shown on the diagram. No other overlying skin changes. Mildly tender to palpation. No red streaking. No induration, fluctuance, or overlying warmth. The patient had significantly thickened calluses on the bottom of her feet with a few scattered corns. She was able to distinguish sharp versus dull sensation around 70% of the time, but has good reflex against sharp.  Onychomycosis present bilaterally. Palpable DP and PT pulses bilaterally. No swelling or pitting edema noted. Tenderness throughout bilateral legs, no focal tenderness. Compartments soft. No  temperature difference. Cap refill brisk. Patient is ambulatory. FROM of ankles and knees.   ?Skin: ?   General: Skin is warm and dry.  ?Neurological:  ?   General: No focal deficit present.  ?   Mental Status: She is alert. Mental status is at baseline.  ? ? ?ED Results / Procedures / Treatments   ?Labs ?(all labs ordered are listed, but only abnormal results are displayed) ?Labs Reviewed  ?CBC WITH DIFFERENTIAL/PLATELET - Abnormal; Notable for the following components:  ?    Result Value  ? RBC 3.25 (*)   ? HCT 35.5 (*)   ? MCV 109.2 (*)   ? MCH 38.5 (*)   ? RDW 16.5 (*)   ? All other components within normal limits  ?COMPREHENSIVE METABOLIC PANEL - Abnormal; Notable for the following components:  ? Potassium 2.5 (*)   ? Chloride 96 (*)   ? AST 55 (*)   ? All other components within normal limits  ?MAGNESIUM - Abnormal; Notable for the following components:  ? Magnesium 1.5 (*)   ? All other components within normal limits  ? ? ?EKG ?None ? ?Radiology ?No results found. ? ?Procedures ?Procedures  ? ?Medications Ordered in ED ?Medications  ?potassium chloride SA (KLOR-CON M) CR tablet 40 mEq (40 mEq Oral Given 07/06/21 2119)  ?oxyCODONE-acetaminophen (PERCOCET/ROXICET) 5-325 MG per tablet 1 tablet (1 tablet Oral Given 07/06/21 2119)  ?potassium chloride 10 mEq in 100 mL IVPB (0 mEq Intravenous Stopped 07/07/21 0006)  ?magnesium sulfate IVPB 2 g 50 mL (0 g Intravenous Stopped 07/07/21 0006)  ? ? ?ED Course/ Medical Decision Making/ A&P ?  ?                        ?Medical Decision Making ?Amount and/or Complexity of Data Reviewed ?Labs: ordered. ? ?Risk ?Prescription drug management. ? ? ?41 year old female presents to the emergency department for evaluation of bilateral foot numbness for weeks, and right foot with some medial erythema noted for the past 3 days.  Differential diagnosis includes was not limited to DVT, cellulitis, corns, electrolyte deficiency.  Vital signs stable.  Patient normotensive, slightly low  temperature 97.4.  Normal pulse rate, satting well on room air without any increased work of breathing.  Physical exam is pertinent for mild erythema present in the medial right foot as shown on the diagram. No other overlying skin changes. Mildly tender to palpation. No red streaking. No induration, fluctuance, or overlying warmth. The patient had significantly thickened calluses on the bottom of her feet with a few scattered corns. She was able to distinguish sharp versus dull sensation around 70% of the time, but has good reflex against sharp.  Onychomycosis present bilaterally. Palpable DP and  PT pulses bilaterally. No swelling or pitting edema noted. Tenderness throughout bilateral legs, no focal tenderness. Compartments soft. No temperature difference. Cap refill brisk. Patient is ambulatory. FROM of ankles and knees.   ? ?Although I have a low suspicion for any DVT at this time, [facility] does not offer ultrasounds past 1900, will put in referral for her to return tomorrow to receive ultrasound.  I discussed this with the patient who verbalized understanding and agrees to plan. ? ?I independently reviewed and interpreted the patient's labs.  CBC shows no leukocytosis.  Hematocrit slightly low at 35.5, however her hemoglobin is normal.  Her CMP shows significant hypokalemia at 2.5.  Chloride slightly low at 96.  AST slightly elevated 55, however no other LFT or electrolyte derangements.  Magnesium pending at this time. ? ?Given her hypokalemia, will replenish orally with 2 rounds of 10 mill equivalent IV.  The patient reports she has a history of hypokalemia and usually requires IV replenishment.  I advised her to follow-up with her PCP to discuss this problem further. ? ?My attending assessed at bedside, possible cellulitis of the foot, will cover with Duricef twice daily for the next 7 days.  Additionally, recommended follow-up with the podiatrist. ? ?I discussed this case with my attending physician who  cosigned this note including patient's presenting symptoms, physical exam, and planned diagnostics and interventions. Attending physician stated agreement with plan or made changes to plan which were implemented.  ? ?A

## 2021-07-06 NOTE — Discharge Instructions (Addendum)
You were seen here in the ER for evaluation of your right foot pain and redness. We are placing you on antibiotic coverage for any infection you may have. Please follow the instructions and take as prescribed. Additionally, I have prescribed you potassium to take twice daily for the next two weeks. Please follow up with your PCP for a re-check of you potassium. Return instructions for your ultrasound included in this discharge paperwork. If you have any chest pain, shortness or breath, lightheadedness, or fever, please return to the ER for re-evaluation.  ? ?Magnesium was also found to be low today.  Please make sure you have these levels rechecked by your primary care provider. ? ?Contact a health care provider if: ?You have a fever. ?Your symptoms do not begin to improve within 1-2 days of starting treatment. ?Your bone or joint underneath the infected area becomes painful after the skin has healed. ?Your infection returns in the same area or another area. ?You notice a swollen bump in the infected area. ?You develop new symptoms. ?You have a general ill feeling (malaise) with muscle aches and pains. ?Get help right away if: ?Your symptoms get worse. ?You feel very sleepy. ?You develop vomiting or diarrhea that persists. ?You notice red streaks coming from the infected area. ?Your red area gets larger or turns dark in color. ?These symptoms may represent a serious problem that is an emergency. Do not wait to see if the symptoms will go away. Get medical help right away. Call your local emergency services (911 in the [place] Do not drive yourself to the hospital. ?

## 2021-07-07 ENCOUNTER — Ambulatory Visit (HOSPITAL_BASED_OUTPATIENT_CLINIC_OR_DEPARTMENT_OTHER)
Admission: RE | Admit: 2021-07-07 | Discharge: 2021-07-07 | Disposition: A | Discharge status: Home / Self Care | Payer: BC Managed Care – PPO | Source: Ambulatory Visit | Attending: Emergency Medicine | Admitting: Emergency Medicine

## 2021-07-07 ENCOUNTER — Other Ambulatory Visit (HOSPITAL_COMMUNITY): Payer: Self-pay | Admitting: Emergency Medicine

## 2021-07-07 DIAGNOSIS — R52 Pain, unspecified: Secondary | ICD-10-CM

## 2021-07-07 NOTE — Progress Notes (Signed)
Lower extremity venous has been completed.  ? ?Preliminary results in CV Proc.  ? ?Holly Graham Holly Graham ?07/07/2021 3:15 PM    ?

## 2021-07-29 ENCOUNTER — Ambulatory Visit (INDEPENDENT_AMBULATORY_CARE_PROVIDER_SITE_OTHER): Payer: Medicaid Other | Admitting: Primary Care

## 2021-07-29 ENCOUNTER — Encounter (INDEPENDENT_AMBULATORY_CARE_PROVIDER_SITE_OTHER): Payer: Self-pay | Admitting: Primary Care

## 2021-07-29 VITALS — BP 150/115 | HR 106 | Temp 98.0°F | Ht 63.0 in | Wt 137.8 lb

## 2021-07-29 DIAGNOSIS — F4321 Adjustment disorder with depressed mood: Secondary | ICD-10-CM

## 2021-07-29 DIAGNOSIS — I10 Essential (primary) hypertension: Secondary | ICD-10-CM

## 2021-07-29 DIAGNOSIS — E876 Hypokalemia: Secondary | ICD-10-CM

## 2021-07-29 MED ORDER — VALSARTAN-HYDROCHLOROTHIAZIDE 160-25 MG PO TABS: 1.0000 | ORAL_TABLET | Freq: Every day | ORAL | 3 refills | Status: AC

## 2021-07-29 NOTE — Patient Instructions (Signed)
Potassium Content of Foods  The body needs potassium to control blood pressure and to keep the muscles and nervous system healthy. Here are some healthy foods below that are high in potassium. Also you can get the white label salt of "NO SALT" salt substitute, 1/4 teaspoon of this is equivalent to 20meq potassium.   FOODS AND DRINKS HIGH IN POTASSIUM FOODS MODERATE IN POTASSIUM   Fruits Avocado (cubed),  c / 50 g. Banana (sliced), 75 g. Cantaloupe (cubed), 80 g. Honeydew, 1 wedge / 85 g. Kiwi (sliced), 90 g. Nectarine, 1 small / 129 g. Orange, 1 medium / 131 g. Vegetables Artichoke,  of a medium / 64 g. Asparagus (boiled), 90 g.. Broccoli (boiled), 78 g. Brussels sprout (boiled), 78 g. Butternut squash (baked), 103 g. Chickpea (cooked), 82 g. Green peas (cooked), 80 g. Kidney beans (cooked), 5 tbsp / 55 g. Lima beans (cooked),  c / 43 g. Navy beans (cooked),  c / 61 g. Spinach (cooked),  c / 45 g. Sweet potato (baked),  c / 50 g. Tomato (chopped or sliced), 90 g. Vegetable juice. White mushrooms (cooked), 78 g. Yam (cooked or baked),  c / 34 g. Zucchini squash (boiled), 90 g. Other Foods and Drinks Almonds (whole),  c / 36 g. Fish, 3 oz / 85 g. Nonfat fruit variety yogurt, 123 g. Pistachio nuts, 1 oz / 28 g. Pumpkin seeds, 1 oz / 28 g. Red meat (broiled, cooked, grilled), 3 oz / 85 g. Scallops (steamed), 3 oz / 85 g. Spaghetti sauce,  c / 66 g. Sunflower seeds (dry roasted), 1 oz / 28 g. Veggie burger, 1 patty / 70 g. Fruits Grapefruit,  of the fruit / 123 g Plums (sliced), 83 g. Tangerine, 1 large / 120 g. Vegetables Carrots (boiled), 78 g. Carrots (sliced), 61 g. Rhubarb (cooked with sugar), 120 g. Rutabaga (cooked), 120 g. Yellow snap beans (cooked), 63 g. Other Foods and Drinks  Chicken breast (roasted and chopped),  c / 70 g. Pita bread, 1 large / 64 g. Shrimp (steamed), 4 oz / 113 g. Swiss cheese (diced), 70 g.     

## 2021-07-29 NOTE — Progress Notes (Signed)
?[place] ? ? ?Ms. Holly Graham is a 41 y.o. female presents for hypertension evaluation. She admits to not able to sleep well at night and wakes up several times . Unsure if she snores .Previously taken melatonin which helped recommended starting back taking it. Denies shortness of breath, headaches, chest pain or lower extremity edema, sudden onset, vision changes, unilateral weakness, dizziness, paresthesias . Bp is extremely elevated previously well controlled she is under a lot of stress laid off from her job and has children, rent , electricity and food , she is overwhelmed  ? ?Patient reports adherence with medications. ? ?Dietary habits include: monitor sodium intake poor appetite  ?Exercise habits include:yes ?Family / Social history: none ? ? ?Past Medical History:  ?Diagnosis Date  ? DVT (deep venous thrombosis) ([facility])   ? Fibroids 05/11/2018  ? History of blood transfusion   ? Iron deficiency anemia   ? Pulmonary emboli ([place])   ? Bilateral  ? ?Past Surgical History:  ?Procedure Laterality Date  ? ABDOMINAL HYSTERECTOMY    ? CHOLECYSTECTOMY N/A 03/14/2021  ? Procedure: LAPAROSCOPIC CHOLECYSTECTOMY  WITH ICG DYE;  Surgeon: Rolm Bookbinder, MD;  Location: [facility];  Service: General;  Laterality: N/A;  LDOW  ? DILATION AND CURETTAGE OF UTERUS    ? INTRAVASCULAR ULTRASOUND/IVUS N/A 05/15/2018  ? Procedure: INTRAVASCULAR ULTRASOUND/IVUS;  Surgeon: Waynetta Sandy, MD;  Location: [facility];  Service: Cardiovascular;  Laterality: N/A;  ? PERIPHERAL VASCULAR THROMBECTOMY N/A 05/15/2018  ? Procedure: PERIPHERAL VASCULAR THROMBECTOMY;  Surgeon: Waynetta Sandy, MD;  Location: [facility];  Service: Cardiovascular;  Laterality: N/A;  ? ROBOTIC ASSISTED TOTAL HYSTERECTOMY WITH BILATERAL SALPINGO OOPHERECTOMY N/A 11/21/2018  ? Procedure: XI ROBOTIC ASSISTED TOTAL HYSTERECTOMY WITH BILATERAL SALPINGECTOMY GREATER THAN 250 GRAMS;  Surgeon: Everitt Amber, MD;   Location: [facility];  Service: Gynecology;  Laterality: N/A;  ? TUBAL LIGATION    ? ?No Known Allergies ?Current Outpatient Medications on File Prior to Visit  ?Medication Sig Dispense Refill  ? acetaminophen (TYLENOL) 500 MG tablet Take 1,000 mg by mouth every 6 (six) hours as needed for mild pain.    ? amLODipine (NORVASC) 10 MG tablet Take 1 tablet (10 mg total) by mouth daily. 90 tablet 0  ? cefadroxil (DURICEF) 500 MG capsule Take 1 capsule (500 mg total) by mouth 2 (two) times daily. 14 capsule 0  ? potassium chloride SA (KLOR-CON M) 20 MEQ tablet Take 1 tablet (20 mEq total) by mouth 2 (two) times daily. 28 tablet 0  ? ?No current facility-administered medications on file prior to visit.  ? ?Social History  ? ?Socioeconomic History  ? Marital status: Single  ?  Spouse name: Not on file  ? Number of children: Not on file  ? Years of education: Not on file  ? Highest education level: Not on file  ?Occupational History  ? Not on file  ?Tobacco Use  ? Smoking status: Never  ? Smokeless tobacco: Never  ?Vaping Use  ? Vaping Use: Never used  ?Substance and Sexual Activity  ? Alcohol use: Yes  ? Drug use: Never  ? Sexual activity: Yes  ?  Birth control/protection: Injection  ?Other Topics Concern  ? Not on file  ?Social History Narrative  ? Not on file  ? ?Social Determinants of Health  ? ?Financial Resource Strain: Not on file  ?Food Insecurity: Not on file  ?Transportation Needs: Not on file  ?Physical Activity: Not on file  ?Stress:  Not on file  ?Social Connections: Not on file  ?Intimate Partner Violence: Not on file  ? ?Family History  ?Problem Relation Age of Onset  ? Pulmonary embolism Brother   ? Diabetes Maternal Grandmother   ? ? ? ?OBJECTIVE: ? ?Vitals:  ? 07/29/21 1032 07/29/21 1046  ?BP: (!) 153/110 (!) 150/115  ?Pulse: (!) 108 (!) 106  ?Temp: 98 ?F (36.7 ?C)   ?TempSrc: Oral   ?SpO2: 97%   ?Weight: 137 lb 12.8 oz (62.5 kg)   ?Height: '5\' 3"'  (1.6 m)   ? ? ?Physical Exam ?Vitals reviewed.  ?Constitutional:    ?   Appearance: Normal appearance. She is normal weight.  ?HENT:  ?   Head: Normocephalic.  ?   Right Ear: External ear normal.  ?   Left Ear: External ear normal.  ?   Nose: Nose normal.  ?Eyes:  ?   Extraocular Movements: Extraocular movements intact.  ?Cardiovascular:  ?   Rate and Rhythm: Normal rate and regular rhythm.  ?Pulmonary:  ?   Effort: Pulmonary effort is normal.  ?   Breath sounds: Normal breath sounds.  ?Abdominal:  ?   General: Bowel sounds are normal.  ?   Palpations: Abdomen is soft.  ?Musculoskeletal:     ?   General: Normal range of motion.  ?   Cervical back: Normal range of motion.  ?Skin: ?   General: Skin is warm and dry.  ?Neurological:  ?   Mental Status: She is alert and oriented to person, place, and time.  ?Psychiatric:     ?   Mood and Affect: Mood normal.     ?   Behavior: Behavior normal.     ?   Thought Content: Thought content normal.     ?   Judgment: Judgment normal.  ? ? ?ROS ?Comprehensive ROS Pertinent positive and negative noted in HPI  ? ?Last 3 Office BP readings: ?BP Readings from Last 3 Encounters:  ?07/29/21 (!) 150/115  ?07/06/21 114/69  ?04/28/21 131/89  ? ? ?BMET ?   ?Component Value Date/Time  ? NA 140 07/06/2021 1926  ? K 2.5 (LL) 07/06/2021 1926  ? CL 96 (L) 07/06/2021 1926  ? CO2 31 07/06/2021 1926  ? GLUCOSE 94 07/06/2021 1926  ? BUN 8 07/06/2021 1926  ? CREATININE 0.75 07/06/2021 1926  ? CALCIUM 9.2 07/06/2021 1926  ? GFRNONAA >60 07/06/2021 1926  ? GFRAA >60 12/13/2018 1448  ? ? ?Renal function: ?CrCl cannot be calculated (Patient's most recent lab result is older than the maximum 21 days allowed.). ? ?Clinical ASCVD: No  ?The ASCVD Risk score (Arnett DK, et al., 2019) failed to calculate for the following reasons: ?  The valid HDL cholesterol range is 20 to 100 mg/dL ? ?ASCVD risk factors include- [place] ? ?ASSESSMENT & PLAN: ?Holly Graham was seen today for follow-up. ? ?Diagnoses and all orders for this visit: ? ?Adjustment disorder with depressed mood ?[facility] Office Visit from 07/29/2021 in [place]  ?PHQ-9 Total Score 18  ? ?  ? Patient recently lost job asked to re-evaluate situation on return visit  ? ?Hypokalemia ?Provided rich potassium foods on AVS ?-     CMP14+EGFR; Future ? ? Essential hypertension ?-Counseled on lifestyle modifications for blood pressure control including reduced dietary sodium, increased exercise, weight reduction and adequate sleep. Also, educated patient about the risk for cardiovascular events, stroke and heart attack. Also counseled patient about the importance of medication adherence.  If you participate in smoking, it is important to stop using tobacco as this will increase the risks associated with uncontrolled blood pressure.  ?Minimize salt intake. ?Minimize alcohol intake ?Added valsartan-hydrochlorothiazide 160/25 daily continue amlodipine 53m daily  ? ? ?This note has been created with DSurveyor, quantity Any transcriptional errors are unintentional.  ? ?MKerin Perna NP ?07/29/2021, 11:08 AM ?  ?

## 2021-08-27 ENCOUNTER — Ambulatory Visit (INDEPENDENT_AMBULATORY_CARE_PROVIDER_SITE_OTHER): Payer: Medicaid Other | Admitting: Primary Care

## 2021-09-17 ENCOUNTER — Ambulatory Visit (INDEPENDENT_AMBULATORY_CARE_PROVIDER_SITE_OTHER): Payer: Medicaid Other | Admitting: Primary Care

## 2021-11-16 ENCOUNTER — Ambulatory Visit (INDEPENDENT_AMBULATORY_CARE_PROVIDER_SITE_OTHER): Payer: Self-pay | Admitting: *Deleted

## 2021-11-16 NOTE — Telephone Encounter (Signed)
Triage incomplete. Dropped call x 2, called pt back, dropped once again. Pt driving. Answer Assessment - Initial Assessment Questions 1. DESCRIPTION: "Tell me about your sleeping problem."      Keep waking up during night 2. ONSET: "How long have you been having trouble sleeping?" (e.g., days, weeks, months)     8 months 3. RECURRENT: "Have you had sleeping problems before?"  If Yes, ask: "What happened that time?" "What helped your sleeping problem go away in the past?"      Yes "When I had blood clots." 4. STRESS: "Is there anything in your life that is making you feel stressed or tense?"      5. PAIN: "Do you have any pain that is keeping you awake?" (e.g., back pain, headache, abdomen pain)     Stomach pain 6. CAFFEINE ABUSE: "Do you drink caffeinated beverages, and how much each day?" (e.g., coffee, tea, colas)      7. ALCOHOL USE OR SUBSTANCE USE (DRUG USE): "Do you drink alcohol or use any illegal drugs?"      8. OTHER SYMPTOMS: "Do you have any other symptoms?"  (e.g., difficulty breathing)     Increased fatigued  Protocols used: Insomnia-A-AH

## 2021-11-17 NOTE — Telephone Encounter (Signed)
Please advise if patient needs to be seen sooner than scheduled appt

## 2021-11-18 NOTE — Telephone Encounter (Signed)
No problem been present 8 months

## 2021-12-08 ENCOUNTER — Ambulatory Visit (INDEPENDENT_AMBULATORY_CARE_PROVIDER_SITE_OTHER): Payer: Medicaid Other | Admitting: Primary Care

## 2021-12-08 ENCOUNTER — Encounter (INDEPENDENT_AMBULATORY_CARE_PROVIDER_SITE_OTHER): Payer: Self-pay | Admitting: Primary Care

## 2021-12-08 VITALS — BP 149/99 | HR 89 | Resp 16 | Wt 144.4 lb

## 2021-12-08 DIAGNOSIS — D649 Anemia, unspecified: Secondary | ICD-10-CM | POA: Diagnosis not present

## 2021-12-08 DIAGNOSIS — I10 Essential (primary) hypertension: Secondary | ICD-10-CM

## 2021-12-08 DIAGNOSIS — E876 Hypokalemia: Secondary | ICD-10-CM

## 2021-12-08 DIAGNOSIS — F419 Anxiety disorder, unspecified: Secondary | ICD-10-CM

## 2021-12-08 DIAGNOSIS — D509 Iron deficiency anemia, unspecified: Secondary | ICD-10-CM

## 2021-12-08 DIAGNOSIS — M62838 Other muscle spasm: Secondary | ICD-10-CM

## 2021-12-08 DIAGNOSIS — F32A Depression, unspecified: Secondary | ICD-10-CM

## 2021-12-08 MED ORDER — MELOXICAM 7.5 MG PO TABS: 7.5000 mg | ORAL_TABLET | Freq: Every day | ORAL | 0 refills | Status: AC

## 2021-12-08 MED ORDER — BUSPIRONE HCL 5 MG PO TABS: 5.0000 mg | ORAL_TABLET | Freq: Two times a day (BID) | ORAL | 1 refills | Status: AC

## 2021-12-08 NOTE — Patient Instructions (Signed)
Buspirone Tablets What is this medication? BUSPIRONE (byoo SPYE rone) treats anxiety. It works by balancing the levels of dopamine and serotonin in your brain, hormones that help regulate mood. This medicine may be used for other purposes; ask your health care provider or pharmacist if you have questions. COMMON BRAND NAME(S): BuSpar, Buspar Dividose What should I tell my care team before I take this medication? They need to know if you have any of these conditions: Kidney or liver disease An unusual or allergic reaction to buspirone, other medications, foods, dyes, or preservatives Pregnant or trying to get pregnant Breast-feeding How should I use this medication? Take this medication by mouth with a glass of water. Follow the directions on the prescription label. You may take this medication with or without food. To ensure that this medication always works the same way for you, you should take it either always with or always without food. Take your doses at regular intervals. Do not take your medication more often than directed. Do not stop taking except on the advice of your care team. Talk to your care team about the use of this medication in children. Special care may be needed. Overdosage: If you think you have taken too much of this medicine contact a poison control center or emergency room at once. NOTE: This medicine is only for you. Do not share this medicine with others. What if I miss a dose? If you miss a dose, take it as soon as you can. If it is almost time for your next dose, take only that dose. Do not take double or extra doses. What may interact with this medication? Do not take this medication with any of the following: Linezolid MAOIs like Carbex, Eldepryl, Marplan, Nardil, and Parnate Methylene blue Procarbazine This medication may also interact with the following: Diazepam Digoxin Diltiazem Erythromycin Grapefruit juice Haloperidol Medications for mental  depression or mood problems Medications for seizures like carbamazepine, phenobarbital and phenytoin Nefazodone Other medications for anxiety Rifampin Ritonavir Some antifungal medications like itraconazole, ketoconazole, and voriconazole Verapamil Warfarin This list may not describe all possible interactions. Give your health care provider a list of all the medicines, herbs, non-prescription drugs, or dietary supplements you use. Also tell them if you smoke, drink alcohol, or use illegal drugs. Some items may interact with your medicine. What should I watch for while using this medication? Visit your care team for regular checks on your progress. It may take 1 to 2 weeks before your anxiety gets better. You may get drowsy or dizzy. Do not drive, use machinery, or do anything that needs mental alertness until you know how this medication affects you. Do not stand or sit up quickly, especially if you are an older patient. This reduces the risk of dizzy or fainting spells. Alcohol can make you more drowsy and dizzy. Avoid alcoholic drinks. What side effects may I notice from receiving this medication? Side effects that you should report to your care team as soon as possible: Allergic reactions--skin rash, itching, hives, swelling of the face, lips, tongue, or throat Irritability, confusion, fast or irregular heartbeat, muscle stiffness, twitching muscles, sweating, high fever, seizure, chills, vomiting, diarrhea, which may be signs of serotonin syndrome Side effects that usually do not require medical attention (report to your care team if they continue or are bothersome): Anxiety or nervousness Dizziness Drowsiness Headache Nausea Trouble sleeping This list may not describe all possible side effects. Call your doctor for medical advice about side effects. You may report   side effects to FDA at 1-800-FDA-1088. Where should I keep my medication? Keep out of the reach of children. Store at room  temperature below 30 degrees C (86 degrees F). Protect from light. Keep container tightly closed. Throw away any unused medication after the expiration date. NOTE: This sheet is a summary. It may not cover all possible information. If you have questions about this medicine, talk to your doctor, pharmacist, or health care provider.  2023 Elsevier/Gold Standard (2020-05-26 00:00:00)  

## 2021-12-11 MED ORDER — AMLODIPINE BESYLATE 10 MG PO TABS: 10.0000 mg | ORAL_TABLET | Freq: Every day | ORAL | 0 refills | Status: AC

## 2021-12-11 NOTE — Progress Notes (Signed)
Holly Graham is a 41 y.o. female presents for hypertension evaluation, Denies shortness of breath, headaches, chest pain or lower extremity edema, sudden onset, vision changes, unilateral weakness, dizziness, paresthesias . She has been having difficult time admits not taking care of her self and taking medication as prescribed. She is experiencing anxiety denies depression or suicidal thought to harm self or others. Patient denies adherence with medications.  Dietary habits include: not following DASH diet  Exercise habits include:no Family / Social history: grandfather DM no CVD    Past Medical History:  Diagnosis Date   DVT (deep venous thrombosis) ([place])    Fibroids 05/11/2018   History of blood transfusion    Iron deficiency anemia    Pulmonary emboli ([facility])    Bilateral   Past Surgical History:  Procedure Laterality Date   ABDOMINAL HYSTERECTOMY     CHOLECYSTECTOMY N/A 03/14/2021   Procedure: LAPAROSCOPIC CHOLECYSTECTOMY  WITH ICG DYE;  Surgeon: Rolm Bookbinder, MD;  Location: [facility];  Service: General;  Laterality: N/A;  LDOW   DILATION AND CURETTAGE OF UTERUS     INTRAVASCULAR ULTRASOUND/IVUS N/A 05/15/2018   Procedure: INTRAVASCULAR ULTRASOUND/IVUS;  Surgeon: Waynetta Sandy, MD;  Location: [facility];  Service: Cardiovascular;  Laterality: N/A;   PERIPHERAL VASCULAR THROMBECTOMY N/A 05/15/2018   Procedure: PERIPHERAL VASCULAR THROMBECTOMY;  Surgeon: Waynetta Sandy, MD;  Location: [facility];  Service: Cardiovascular;  Laterality: N/A;   ROBOTIC ASSISTED TOTAL HYSTERECTOMY WITH BILATERAL SALPINGO OOPHERECTOMY N/A 11/21/2018   Procedure: XI ROBOTIC ASSISTED TOTAL HYSTERECTOMY WITH BILATERAL SALPINGECTOMY GREATER THAN 250 GRAMS;  Surgeon: Everitt Amber, MD;  Location: [facility];  Service: Gynecology;  Laterality: N/A;   TUBAL LIGATION     No Known Allergies Current Outpatient Medications on File Prior to Visit   Medication Sig Dispense Refill   acetaminophen (TYLENOL) 500 MG tablet Take 1,000 mg by mouth every 6 (six) hours as needed for mild pain.     cefadroxil (DURICEF) 500 MG capsule Take 1 capsule (500 mg total) by mouth 2 (two) times daily. (Patient not taking: Reported on 12/08/2021) 14 capsule 0   potassium chloride SA (KLOR-CON M) 20 MEQ tablet Take 1 tablet (20 mEq total) by mouth 2 (two) times daily. 28 tablet 0   valsartan-hydrochlorothiazide (DIOVAN-HCT) 160-25 MG tablet Take 1 tablet by mouth daily. 90 tablet 3   No current facility-administered medications on file prior to visit.   Social History   Socioeconomic History   Marital status: Single    Spouse name: Not on file   Number of children: Not on file   Years of education: Not on file   Highest education level: Not on file  Occupational History   Not on file  Tobacco Use   Smoking status: Never   Smokeless tobacco: Never  Vaping Use   Vaping Use: Never used  Substance and Sexual Activity   Alcohol use: Yes   Drug use: Never   Sexual activity: Yes    Birth control/protection: Injection  Other Topics Concern   Not on file  Social History Narrative   Not on file   Social Determinants of Health   Financial Resource Strain: Not on file  Food Insecurity: No Food Insecurity (06/01/2018)   Hunger Vital Sign    Worried About Running Out of Food in the Last Year: Never true    Ran Out of Food in the Last Year: Never true  Transportation Needs: No Transportation Needs (  06/01/2018)   PRAPARE - Hydrologist (Medical): No    Lack of Transportation (Non-Medical): No  Physical Activity: Not on file  Stress: Not on file  Social Connections: Not on file  Intimate Partner Violence: Not on file   Family History  Problem Relation Age of Onset   Pulmonary embolism Brother    Diabetes Maternal Grandmother      OBJECTIVE:  Vitals:   12/08/21 1406  BP: (!) 149/99  Pulse: 89  Resp: 16  SpO2:  100%  Weight: 144 lb 6.4 oz (65.5 kg)   Physical exam: General: Vital signs reviewed.  Patient is well-developed and well-nourished, normal weight no acute distress and cooperative with exam. Head: Normocephalic and atraumatic. Eyes: EOMI, conjunctivae normal, no scleral icterus. Neck: Supple, trachea midline, normal ROM, no JVD, masses, thyromegaly, or carotid bruit present. Cardiovascular: RRR, S1 normal, S2 normal, no murmurs, gallops, or rubs. Pulmonary/Chest: Clear to auscultation bilaterally, no wheezes, rales, or rhonchi. Abdominal: Soft, non-tender, non-distended, BS +, no masses, organomegaly, or guarding present. Musculoskeletal: No joint deformities, erythema, or stiffness, ROM full and nontender. Extremities: No lower extremity edema bilaterally,  pulses symmetric and intact bilaterally. No cyanosis or clubbing. Neurological: A&O x3, Strength is normal Skin: Warm, dry and intact. No rashes or erythema. Psychiatric: Normal mood and affect. speech and behavior is normal. Cognition and memory are normal.     ROS Comprehensive ROS Pertinent positive and negative noted in HPI   Last 3 Office BP readings: BP Readings from Last 3 Encounters:  12/08/21 (!) 149/99  07/29/21 (!) 150/115  07/06/21 114/69    BMET    Component Value Date/Time   NA 140 07/06/2021 1926   K 2.5 (LL) 07/06/2021 1926   CL 96 (L) 07/06/2021 1926   CO2 31 07/06/2021 1926   GLUCOSE 94 07/06/2021 1926   BUN 8 07/06/2021 1926   CREATININE 0.75 07/06/2021 1926   CALCIUM 9.2 07/06/2021 1926   GFRNONAA >60 07/06/2021 1926   GFRAA >60 12/13/2018 1448    Renal function: CrCl cannot be calculated (Patient's most recent lab result is older than the maximum 21 days allowed.).  Clinical ASCVD:    The ASCVD Risk score (Arnett DK, et al., 2019) failed to calculate for the following reasons:   The valid HDL cholesterol range is 20 to 100 mg/dL  ASCVD risk factors include- [place]   ASSESSMENT & PLAN: Meds  ordered this encounter  Medications   busPIRone (BUSPAR) 5 MG tablet    Sig: Take 1 tablet (5 mg total) by mouth 2 (two) times daily.    Dispense:  60 tablet    Refill:  1    Order Specific Question:   Supervising Provider    Answer:   Tresa Garter [[id number]]   meloxicam (MOBIC) 7.5 MG tablet    Sig: Take 1 tablet (7.5 mg total) by mouth daily.    Dispense:  30 tablet    Refill:  0    Order Specific Question:   Supervising Provider    Answer:   Tresa Garter [[id number]]   amLODipine (NORVASC) 10 MG tablet    Sig: Take 1 tablet (10 mg total) by mouth daily.    Dispense:  90 tablet    Refill:  0    Order Specific Question:   Supervising Provider    Answer:   Tresa Garter [[id number]]  Holly Graham was seen today for blood pressure check.  Diagnoses and all orders for  this visit: Normocytic normochromic anemia -     Iron, TIBC and Ferritin Panel  Anxiety and depression Information provided on AVS about medication and side effects.  -     busPIRone (BUSPAR) 5 MG tablet; Take 1 tablet (5 mg total) by mouth 2 (two) times daily.  Neck muscle spasm -     meloxicam (MOBIC) 7.5 MG tablet; Take 1 tablet (7.5 mg total) by mouth daily.  Other orders -     amLODipine (NORVASC) 10 MG tablet; Take 1 tablet (10 mg total) by mouth daily.  Essential hypertension -Counseled on lifestyle modifications for blood pressure control including reduced dietary sodium, increased exercise, weight reduction and adequate sleep. Also, educated patient about the risk for cardiovascular events, stroke and heart attack. Also counseled patient about the importance of medication adherence. If you participate in smoking, it is important to stop using tobacco as this will increase the risks associated with uncontrolled blood pressure. .   Goal BP:  For patients younger than 60: Goal BP < 130/80. For patients 60 and older: Goal BP < 140/90. For patients with diabetes: Goal BP < 130/80. Your most recent  BP: 149/99  Minimize salt intake. Minimize alcohol intake    This note has been created with Surveyor, quantity. Any transcriptional errors are unintentional.   [name], NP 12/11/2021, 11:09 PM

## 2021-12-12 LAB — IRON,TIBC AND FERRITIN PANEL
Ferritin: 179 ng/mL — ABNORMAL HIGH (ref 15–150)
Iron Saturation: 10 % — ABNORMAL LOW (ref 15–55)
Iron: 26 ug/dL — ABNORMAL LOW (ref 27–159)
Total Iron Binding Capacity: 252 ug/dL (ref 250–450)
UIBC: 226 ug/dL (ref 131–425)

## 2021-12-17 ENCOUNTER — Other Ambulatory Visit (INDEPENDENT_AMBULATORY_CARE_PROVIDER_SITE_OTHER): Payer: Self-pay | Admitting: Primary Care

## 2021-12-17 DIAGNOSIS — D509 Iron deficiency anemia, unspecified: Secondary | ICD-10-CM

## 2021-12-17 MED ORDER — SENNA-DOCUSATE SODIUM 8.6-50 MG PO TABS: 1.0000 | ORAL_TABLET | Freq: Every day | ORAL | 1 refills | Status: AC

## 2021-12-17 MED ORDER — IRON (FERROUS SULFATE) 325 (65 FE) MG PO TABS: 325.0000 mg | ORAL_TABLET | Freq: Every day | ORAL | 1 refills | Status: AC

## 2022-01-26 ENCOUNTER — Ambulatory Visit (INDEPENDENT_AMBULATORY_CARE_PROVIDER_SITE_OTHER): Payer: Medicaid Other | Admitting: Primary Care

## 2022-05-24 ENCOUNTER — Other Ambulatory Visit: Payer: Self-pay | Admitting: *Deleted

## 2022-05-24 DIAGNOSIS — M79604 Pain in right leg: Secondary | ICD-10-CM

## 2022-05-31 NOTE — Progress Notes (Deleted)
HISTORY AND PHYSICAL     CC:  follow up. Requesting Provider:  [name], NP  HPI: This is a 42 y.o. 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  Her procedure was performed on 05/15/18.  She stopped the Xarelto 6 months after the stents were placed as directed by our office.  She did have hysterectomy as well.   Pt was last seen 03/18/2021 and at that time, she was doing well.  Her swelling was much improved.  She was not taking aspirin and she was advised to restart this when it was ok with general surgery since she had just had cholecystectomy.    The pt returns today for follow up.  ***  The pt not on a statin for cholesterol management.    The pt *** on an aspirin.    Other AC:  none The pt is on CCB, ARB, diuretic for hypertension.  The pt does not have diabetes. Tobacco hx:  never  Pt does not have family hx of AAA.  Past Medical History:  Diagnosis Date   DVT (deep venous thrombosis) ([place])    Fibroids 05/11/2018   History of blood transfusion    Iron deficiency anemia    Pulmonary emboli ([facility])    Bilateral    Past Surgical History:  Procedure Laterality Date   ABDOMINAL HYSTERECTOMY     CHOLECYSTECTOMY N/A 03/14/2021   Procedure: LAPAROSCOPIC CHOLECYSTECTOMY  WITH ICG DYE;  Surgeon: Rolm Bookbinder, MD;  [facility];  Service: General;  Laterality: N/A;  LDOW   DILATION AND CURETTAGE OF UTERUS     INTRAVASCULAR ULTRASOUND/IVUS N/A 05/15/2018   Procedure: INTRAVASCULAR ULTRASOUND/IVUS;  Surgeon: Waynetta Sandy, MD;  Location: [facility];  Service: Cardiovascular;  Laterality: N/A;   PERIPHERAL VASCULAR THROMBECTOMY N/A 05/15/2018   Procedure: PERIPHERAL VASCULAR THROMBECTOMY;  Surgeon: Waynetta Sandy, MD;  Location: [facility];  Service: Cardiovascular;  Laterality: N/A;    ROBOTIC ASSISTED TOTAL HYSTERECTOMY WITH BILATERAL SALPINGO OOPHERECTOMY N/A 11/21/2018   Procedure: XI ROBOTIC ASSISTED TOTAL HYSTERECTOMY WITH BILATERAL SALPINGECTOMY GREATER THAN 250 GRAMS;  Surgeon: Everitt Amber, MD;  Location: [facility];  Service: Gynecology;  Laterality: N/A;   TUBAL LIGATION      No Known Allergies  Current Outpatient Medications  Medication Sig Dispense Refill   acetaminophen (TYLENOL) 500 MG tablet Take 1,000 mg by mouth every 6 (six) hours as needed for mild pain.     amLODipine (NORVASC) 10 MG tablet Take 1 tablet (10 mg total) by mouth daily. 90 tablet 0   busPIRone (BUSPAR) 5 MG tablet Take 1 tablet (5 mg total) by mouth 2 (two) times daily. 60 tablet 1   cefadroxil (DURICEF) 500 MG capsule Take 1 capsule (500 mg total) by mouth 2 (two) times daily. (Patient not taking: Reported on 12/08/2021) 14 capsule 0   Iron, Ferrous Sulfate, 325 (65 Fe) MG TABS Take 325 mg by mouth daily. 90 tablet 1   meloxicam (MOBIC) 7.5 MG tablet Take 1 tablet (7.5 mg total) by mouth daily. 30 tablet 0   potassium chloride SA (KLOR-CON M) 20 MEQ tablet Take 1 tablet (20 mEq total) by mouth 2 (two) times daily. 28 tablet 0   sennosides-docusate sodium (SENOKOT-S) 8.6-50 MG tablet Take 1 tablet by mouth daily. 90 tablet 1   valsartan-hydrochlorothiazide (  DIOVAN-HCT) 160-25 MG tablet Take 1 tablet by mouth daily. 90 tablet 3   No current facility-administered medications for this visit.    Family History  Problem Relation Age of Onset   Pulmonary embolism Brother    Diabetes Maternal Grandmother     Social History   Socioeconomic History   Marital status: Single    Spouse name: Not on file   Number of children: Not on file   Years of education: Not on file   Highest education level: Not on file  Occupational History   Not on file  Tobacco Use   Smoking status: Never   Smokeless tobacco: Never  Vaping Use   Vaping Use: Never used  Substance and Sexual Activity   Alcohol use:  Yes   Drug use: Never   Sexual activity: Yes    Birth control/protection: Injection  Other Topics Concern   Not on file  Social History Narrative   Not on file   Social Determinants of Health   Financial Resource Strain: Not on file  Food Insecurity: No Food Insecurity (06/01/2018)   Hunger Vital Sign    Worried About Running Out of Food in the Last Year: Never true    Ran Out of Food in the Last Year: Never true  Transportation Needs: No Transportation Needs (06/01/2018)   PRAPARE - Hydrologist (Medical): No    Lack of Transportation (Non-Medical): No  Physical Activity: Not on file  Stress: Not on file  Social Connections: Not on file  Intimate Partner Violence: Not on file     REVIEW OF SYSTEMS:  *** '[X]'$  denotes positive finding, '[ ]'$  denotes negative finding Cardiac  Comments:  Chest pain or chest pressure:    Shortness of breath upon exertion:    Short of breath when lying flat:    Irregular heart rhythm:        Vascular    Pain in calf, thigh, or hip brought on by ambulation:    Pain in feet at night that wakes you up from your sleep:     Blood clot in your veins:    Leg swelling:         Pulmonary    Oxygen at home:    Productive cough:     Wheezing:         Neurologic    Sudden weakness in arms or legs:     Sudden numbness in arms or legs:     Sudden onset of difficulty speaking or slurred speech:    Temporary loss of vision in one eye:     Problems with dizziness:         Gastrointestinal    Blood in stool:     Vomited blood:         Genitourinary    Burning when urinating:     Blood in urine:        Psychiatric    Major depression:         Hematologic    Bleeding problems:    Problems with blood clotting too easily:        Skin    Rashes or ulcers:        Constitutional    Fever or chills:      PHYSICAL EXAMINATION:  ***  General:  WDWN in NAD; vital signs documented above Gait: Not observed HENT: WNL,  normocephalic Pulmonary: normal non-labored breathing , without wheezing Cardiac: {Desc; regular/irreg:14544} HR, {With/Without:20273} carotid  bruit*** Abdomen: soft, NT; aortic pulse is *** palpable Skin: {With/Without:20273} rashes Vascular Exam/Pulses:  Right Left  Radial {Exam; arterial pulse strength 0-4:30167} {Exam; arterial pulse strength 0-4:30167}  Femoral {Exam; arterial pulse strength 0-4:30167} {Exam; arterial pulse strength 0-4:30167}  Popliteal {Exam; arterial pulse strength 0-4:30167} {Exam; arterial pulse strength 0-4:30167}  DP {Exam; arterial pulse strength 0-4:30167} {Exam; arterial pulse strength 0-4:30167}  PT {Exam; arterial pulse strength 0-4:30167} {Exam; arterial pulse strength 0-4:30167}  Peroneal *** ***   Extremities: {With/Without:20273} ischemic changes, {With/Without:20273} Gangrene , {With/Without:20273} cellulitis; {With/Without:20273} open wounds Musculoskeletal: no muscle wasting or atrophy  Neurologic: A&O X 3 Psychiatric:  The pt has {Desc; normal/abnormal:11317::"Normal"} affect.   Non-Invasive Vascular Imaging:   Venous duplex on 06/01/2022: ***   Previous Venous duplex on 03/18/2021: Summary:  IVC/Iliac: Patent IVC, Right common iliac, Left common iliac stent and left external iliac veins     ASSESSMENT/PLAN:: 42 y.o. female here for follow up for *** with hx of ***   -*** -continue *** -pt will f/u in *** with ***.   Leontine Locket, [facility] [phone]  Clinic MD:   Carlis Abbott

## 2022-06-01 ENCOUNTER — Ambulatory Visit: Payer: Medicaid Other

## 2022-06-01 ENCOUNTER — Ambulatory Visit (HOSPITAL_COMMUNITY): Payer: Medicaid Other | Attending: Surgery

## 2022-06-14 ENCOUNTER — Emergency Department (HOSPITAL_COMMUNITY): Payer: Medicaid Other

## 2022-06-14 ENCOUNTER — Emergency Department (HOSPITAL_COMMUNITY)
Admission: EM | Admit: 2022-06-14 | Discharge: 2022-06-14 | Discharge status: Against Medical Advice | Payer: Medicaid Other | Attending: Emergency Medicine | Admitting: Emergency Medicine

## 2022-06-14 ENCOUNTER — Other Ambulatory Visit: Payer: Self-pay

## 2022-06-14 ENCOUNTER — Encounter (HOSPITAL_COMMUNITY): Payer: Self-pay

## 2022-06-14 DIAGNOSIS — S99911A Unspecified injury of right ankle, initial encounter: Secondary | ICD-10-CM | POA: Insufficient documentation

## 2022-06-14 DIAGNOSIS — Z5321 Procedure and treatment not carried out due to patient leaving prior to being seen by health care provider: Secondary | ICD-10-CM | POA: Insufficient documentation

## 2022-06-14 DIAGNOSIS — Y92003 Bedroom of unspecified non-institutional (private) residence as the place of occurrence of the external cause: Secondary | ICD-10-CM | POA: Diagnosis not present

## 2022-06-14 DIAGNOSIS — W1840XA Slipping, tripping and stumbling without falling, unspecified, initial encounter: Secondary | ICD-10-CM | POA: Insufficient documentation

## 2022-06-14 NOTE — ED Triage Notes (Signed)
Pt states that she tripped over her shoes and injured her R foot.

## 2022-06-14 NOTE — ED Provider Triage Note (Signed)
Emergency Medicine Provider Triage Evaluation Note  Holly Graham , a 42 y.o. female  was evaluated in triage.  Pt complains of right ankle pain, began acutely 2 days ago.  Patient tripped on his shoe while getting out of bed.  She has had difficulty walking since that time due to pain.  She has pain on both the lateral and medial aspects of the ankle.  No knee or hip pain.  She has been taking Tylenol without improvement.  She has wrapped the ankle as well.  Review of Systems  Positive: Ankle pain and swelling Negative: Knee or hip pain  Physical Exam  BP (!) 132/90   Pulse 97   Temp 97.8 F (36.6 C) (Oral)   Resp 19   Ht 5\' 3"  (1.6 m)   Wt 65.8 kg   LMP 08/21/2018   SpO2 98%   BMI 25.69 kg/m  Gen:   Awake, no distress   Resp:  Normal effort  MSK:   Moves extremities without difficulty  Other:  Tenderness over the lateral medial malleolus of the ankle with associated swelling  Medical Decision Making  Medically screening exam initiated at 7:15 PM.  Appropriate orders placed.  Holly Graham was informed that the remainder of the evaluation will be completed by another provider, this initial triage assessment does not replace that evaluation, and the importance of remaining in the ED until their evaluation is complete.     Carlisle Cater, PA-C 06/14/22 [id number]

## 2022-06-14 NOTE — ED Notes (Signed)
Pt not in lobby. Animal nutritionist at [employer] stated pt left with family member

## 2022-06-17 ENCOUNTER — Ambulatory Visit (HOSPITAL_COMMUNITY): Payer: Medicaid Other | Attending: Vascular Surgery

## 2022-08-06 ENCOUNTER — Ambulatory Visit (INDEPENDENT_AMBULATORY_CARE_PROVIDER_SITE_OTHER): Payer: Self-pay | Admitting: *Deleted

## 2022-08-06 NOTE — Telephone Encounter (Signed)
  Chief Complaint: post hysterectomy-2020, spotting- dark Symptoms: see above Frequency: 3 days Pertinent Negatives: Patient denies any other symptoms Disposition: [] ED /[] Urgent Care (no appt availability in office) / [x] Appointment(In office/virtual)/ []  [facility]/ [] Home Care/ [] Refused Recommended Disposition /[] [facility]/ []  Follow-up with PCP Additional Notes: Patient reports she has been spotting dark show for 3 days- she has had hysterectomy. Appointment has been scheduled- within disposition- call sent to see if provider can schedule sooner appointment.

## 2022-08-06 NOTE — Telephone Encounter (Signed)
Summary: Pt concerned because she has a history of blood clots   Pt stated she had a hysterectomy and a history of blood clots and recently she has been spotting. Pt requests to speak with a nurse to discuss. Cb# [phone]         Reason for Disposition  Bleeding or spotting occurs after hysterectomy  Answer Assessment - Initial Assessment Questions 1. AMOUNT: "Describe the bleeding that you are having."    - SPOTTING: spotting, or pinkish / brownish mucous discharge; does not fill panty liner or pad    - MILD:  less than 1 pad / hour; less than patient's usual menstrual bleeding   - MODERATE: 1-2 pads / hour; 1 menstrual cup every 6 hours; small-medium blood clots (e.g., pea, grape, small coin)   - SEVERE: soaking 2 or more pads/hour for 2 or more hours; 1 menstrual cup every 2 hours; bleeding not contained by pads or continuous red blood from vagina; large blood clots (e.g., golf ball, large coin)      spotting 2. ONSET: "When did the bleeding begin?" "Is it continuing now?"     3 days 3. MENSTRUAL PERIOD: "When was the last normal menstrual period?" "How is this different than your period?"     2020 4. REGULARITY: "How regular are your periods?"     na 5. ABDOMEN PAIN: "Do you have any pain?" "How bad is the pain?"  (e.g., Scale 1-10; mild, moderate, or severe)   - MILD (1-3): doesn't interfere with normal activities, abdomen soft and not tender to touch    - MODERATE (4-7): interferes with normal activities or awakens from sleep, abdomen tender to touch    - SEVERE (8-10): excruciating pain, doubled over, unable to do any normal activities      no  8. HORMONE MEDICINES: "Are you taking any hormone medicines, prescription or over-the-counter?" (e.g., birth control pills, estrogen)     no 9. BLOOD THINNER MEDICINES: "Do you take any blood thinners?" (e.g., Coumadin / warfarin, Pradaxa / dabigatran, aspirin)     no 10. CAUSE: "What do you think is causing the bleeding?" (e.g.,  recent gyn surgery, recent gyn procedure; known bleeding disorder, cervical cancer, polycystic ovarian disease, fibroids)         Unsure- dark show  12. OTHER SYMPTOMS: "What other symptoms are you having with the bleeding?" (e.g., passed tissue, vaginal discharge, fever, menstrual-type cramps)       no  Protocols used: Vaginal Bleeding - Abnormal-A-AH

## 2022-08-09 NOTE — Telephone Encounter (Signed)
Apt scheduled for 08/10/2022 at 1010. Patient aware.

## 2022-08-10 ENCOUNTER — Ambulatory Visit (INDEPENDENT_AMBULATORY_CARE_PROVIDER_SITE_OTHER): Payer: Medicaid Other | Admitting: Primary Care

## 2022-08-20 ENCOUNTER — Ambulatory Visit (INDEPENDENT_AMBULATORY_CARE_PROVIDER_SITE_OTHER): Payer: Medicaid Other | Admitting: Primary Care

## 2022-10-08 ENCOUNTER — Other Ambulatory Visit: Payer: Self-pay | Admitting: *Deleted

## 2022-10-08 DIAGNOSIS — I871 Compression of vein: Secondary | ICD-10-CM

## 2022-10-26 NOTE — Progress Notes (Deleted)
Office Note  History of Present Illness   Holly Graham is a 42 y.o. (04/16/1980) female who presents for follow up.   Past Medical History:  Diagnosis Date   DVT (deep venous thrombosis) (HCC)    Fibroids 05/11/2018   History of blood transfusion    Iron deficiency anemia    Pulmonary emboli (HCC)    Bilateral    Past Surgical History:  Procedure Laterality Date   ABDOMINAL HYSTERECTOMY     CHOLECYSTECTOMY N/A 03/14/2021   Procedure: LAPAROSCOPIC CHOLECYSTECTOMY  WITH ICG DYE;  Surgeon: Emelia Loron, MD;  Location: [facility];  Service: General;  Laterality: N/A;  LDOW   CORONARY ULTRASOUND/IVUS N/A 05/15/2018   Procedure: INTRAVASCULAR ULTRASOUND/IVUS;  Surgeon: Maeola Harman, MD;  Location: [facility] INVASIVE CV LAB;  Service: Cardiovascular;  Laterality: N/A;   DILATION AND CURETTAGE OF UTERUS     PERIPHERAL VASCULAR THROMBECTOMY N/A 05/15/2018   Procedure: PERIPHERAL VASCULAR THROMBECTOMY;  Surgeon: Maeola Harman, MD;  Location: [facility] INVASIVE CV LAB;  Service: Cardiovascular;  Laterality: N/A;   ROBOTIC ASSISTED TOTAL HYSTERECTOMY WITH BILATERAL SALPINGO OOPHERECTOMY N/A 11/21/2018   Procedure: XI ROBOTIC ASSISTED TOTAL HYSTERECTOMY WITH BILATERAL SALPINGECTOMY GREATER THAN 250 GRAMS;  Surgeon: Adolphus Birchwood, MD;  Location: [facility];  Service: Gynecology;  Laterality: N/A;   TUBAL LIGATION      Social History   Socioeconomic History   Marital status: Single    Spouse name: Not on file   Number of children: Not on file   Years of education: Not on file   Highest education level: Not on file  Occupational History   Not on file  Tobacco Use   Smoking status: Never   Smokeless tobacco: Never  Vaping Use   Vaping status: Never Used  Substance and Sexual Activity   Alcohol use: Yes   Drug use: Never   Sexual activity: Yes    Birth control/protection: Injection  Other Topics Concern   Not on file  Social History Narrative   Not on file    Social Determinants of Health   Financial Resource Strain: Not on file  Food Insecurity: No Food Insecurity (06/01/2018)   Hunger Vital Sign    Worried About Running Out of Food in the Last Year: Never true    Ran Out of Food in the Last Year: Never true  Transportation Needs: No Transportation Needs (06/01/2018)   PRAPARE - Administrator, Civil Service (Medical): No    Lack of Transportation (Non-Medical): No  Physical Activity: Not on file  Stress: Not on file  Social Connections: Unknown (08/05/2021)   Received from [facility]   Social Network    Social Network: Not on file  Intimate Partner Violence: Unknown (07/01/2021)   Received from [facility]   HITS    Physically Hurt: Not on file    Insult or Talk Down To: Not on file    Threaten Physical Harm: Not on file    Scream or Curse: Not on file   *** Family History  Problem Relation Age of Onset   Pulmonary embolism Brother    Diabetes Maternal Grandmother     Current Outpatient Medications  Medication Sig Dispense Refill   acetaminophen (TYLENOL) 500 MG tablet Take 1,000 mg by mouth every 6 (six) hours as needed for mild pain.     amLODipine (NORVASC) 10 MG tablet Take 1 tablet (10 mg total) by mouth daily. 90 tablet  0   busPIRone (BUSPAR) 5 MG tablet Take 1 tablet (5 mg total) by mouth 2 (two) times daily. 60 tablet 1   cefadroxil (DURICEF) 500 MG capsule Take 1 capsule (500 mg total) by mouth 2 (two) times daily. (Patient not taking: Reported on 12/08/2021) 14 capsule 0   Iron, Ferrous Sulfate, 325 (65 Fe) MG TABS Take 325 mg by mouth daily. 90 tablet 1   meloxicam (MOBIC) 7.5 MG tablet Take 1 tablet (7.5 mg total) by mouth daily. 30 tablet 0   potassium chloride SA (KLOR-CON M) 20 MEQ tablet Take 1 tablet (20 mEq total) by mouth 2 (two) times daily. 28 tablet 0   sennosides-docusate sodium (SENOKOT-S) 8.6-50 MG tablet Take 1 tablet by mouth daily. 90 tablet 1   valsartan-hydrochlorothiazide (DIOVAN-HCT)  160-25 MG tablet Take 1 tablet by mouth daily. 90 tablet 3   No current facility-administered medications for this visit.    No Known Allergies  ***REVIEW OF SYSTEMS (negative unless checked):   Cardiac:  []  Chest pain or chest pressure? []  Shortness of breath upon activity? []  Shortness of breath when lying flat? []  Irregular heart rhythm?  Vascular:  []  Pain in calf, thigh, or hip brought on by walking? []  Pain in feet at night that wakes you up from your sleep? []  Blood clot in your veins? []  Leg swelling?  Pulmonary:  []  Oxygen at home? []  Productive cough? []  Wheezing?  Neurologic:  []  Sudden weakness in arms or legs? []  Sudden numbness in arms or legs? []  Sudden onset of difficult speaking or slurred speech? []  Temporary loss of vision in one eye? []  Problems with dizziness?  Gastrointestinal:  []  Blood in stool? []  Vomited blood?  Genitourinary:  []  Burning when urinating? []  Blood in urine?  Psychiatric:  []  Major depression  Hematologic:  []  Bleeding problems? []  Problems with blood clotting?  Dermatologic:  []  Rashes or ulcers?  Constitutional:  []  Fever or chills?  Ear/Nose/Throat:  []  Change in hearing? []  Nose bleeds? []  Sore throat?  Musculoskeletal:  []  Back pain? []  Joint pain? []  Muscle pain?   Physical Examination    There were no vitals filed for this visit. There is no height or weight on file to calculate BMI.  General:  WDWN in NAD; vital signs documented above Gait: Not observed HENT: WNL, normocephalic Pulmonary: normal non-labored breathing , without Rales, rhonchi,  wheezing Cardiac: {Desc; regular/irreg:14544} HR, without  Murmurs {With/Without:20273} carotid bruit*** Abdomen: soft, NT, no masses Skin: {With/Without:20273} rashes Vascular Exam/Pulses:  Right Left  Radial {Exam; arterial pulse strength 0-4:30167} {Exam; arterial pulse strength 0-4:30167}  Ulnar {Exam; arterial pulse strength 0-4:30167} {Exam;  arterial pulse strength 0-4:30167}  Femoral {Exam; arterial pulse strength 0-4:30167} {Exam; arterial pulse strength 0-4:30167}  Popliteal {Exam; arterial pulse strength 0-4:30167} {Exam; arterial pulse strength 0-4:30167}  DP {Exam; arterial pulse strength 0-4:30167} {Exam; arterial pulse strength 0-4:30167}  PT {Exam; arterial pulse strength 0-4:30167} {Exam; arterial pulse strength 0-4:30167}   Extremities: {With/Without:20273} varicose veins, {With/Without:20273} reticular veins, {With/Without:20273} edema, {With/Without:20273} stasis pigmentation, {With/Without:20273} lipodermatosclerosis, {With/Without:20273} ulcers Musculoskeletal: no muscle wasting or atrophy  Neurologic: A&O X 3;  No focal weakness or paresthesias are detected Psychiatric:  The pt has {Desc; normal/abnormal:11317::"Normal"} affect.  Non-invasive Vascular Imaging   BLE Venous Insufficiency Duplex (***):  RLE:  *** DVT and SVT,  *** GSV reflux ***, GSV diameter *** *** SSV reflux ***, *** deep venous reflux  LLE: *** DVT and SVT,  *** GSV reflux ***,  GSV  diameter *** *** SSV reflux ***, *** deep venous reflux   Medical Decision Making   Holly Graham is a 42 y.o. female who presents with: ***LE chronic venous insufficiency, ***varicose veins with complications  Based on the patient's history and examination, I recommend: ***. I discussed with the patient the use of her 20-30 mm thigh high compression stockings and need for 3 month trial of such. The patient will follow up in 3 months with Dr. Marland Kitchen Thank you for allowing [place] to participate in this patient's care.    Sharin Mons, PA-C [facility] Office: [phone]  10/26/2022, 11:37 AM  Clinic MD: ***

## 2022-10-27 ENCOUNTER — Ambulatory Visit: Payer: Medicaid Other

## 2022-10-27 ENCOUNTER — Ambulatory Visit (HOSPITAL_COMMUNITY): Admission: RE | Admit: 2022-10-27 | Payer: Medicaid Other | Source: Ambulatory Visit
